# Patient Record
Sex: Female | Born: 1940
Health system: Southern US, Community
[De-identification: ages and names within clinical notes are randomized; demographics above are authoritative.]

## PROBLEM LIST (undated history)

## (undated) DIAGNOSIS — D649 Anemia, unspecified: Secondary | ICD-10-CM

## (undated) DIAGNOSIS — G8929 Other chronic pain: Secondary | ICD-10-CM

## (undated) DIAGNOSIS — M7130 Other bursal cyst, unspecified site: Secondary | ICD-10-CM

## (undated) DIAGNOSIS — J45909 Unspecified asthma, uncomplicated: Secondary | ICD-10-CM

## (undated) DIAGNOSIS — M4800 Spinal stenosis, site unspecified: Secondary | ICD-10-CM

## (undated) DIAGNOSIS — Z90710 Acquired absence of both cervix and uterus: Secondary | ICD-10-CM

## (undated) DIAGNOSIS — Z981 Arthrodesis status: Secondary | ICD-10-CM

## (undated) DIAGNOSIS — K5790 Diverticulosis of intestine, part unspecified, without perforation or abscess without bleeding: Secondary | ICD-10-CM

## (undated) DIAGNOSIS — I10 Essential (primary) hypertension: Secondary | ICD-10-CM

## (undated) DIAGNOSIS — M549 Dorsalgia, unspecified: Secondary | ICD-10-CM

## (undated) HISTORY — DX: Arthrodesis status: Z98.1

## (undated) HISTORY — PX: BACK SURGERY: SHX140

## (undated) HISTORY — PX: CYST REMOVAL TRUNK: SHX6283

## (undated) HISTORY — PX: ABDOMINAL HYSTERECTOMY: SHX81

---

## 1998-07-10 ENCOUNTER — Ambulatory Visit (HOSPITAL_COMMUNITY): Admission: RE | Admit: 1998-07-10 | Discharge: 1998-07-10 | Payer: Self-pay | Admitting: Gastroenterology

## 1998-11-25 ENCOUNTER — Other Ambulatory Visit: Admission: RE | Admit: 1998-11-25 | Discharge: 1998-11-25 | Payer: Self-pay | Admitting: Family Medicine

## 1999-11-18 ENCOUNTER — Encounter: Payer: Self-pay | Admitting: Family Medicine

## 1999-11-18 ENCOUNTER — Encounter: Admission: RE | Admit: 1999-11-18 | Discharge: 1999-11-18 | Payer: Self-pay | Admitting: Family Medicine

## 2000-12-09 ENCOUNTER — Encounter: Payer: Self-pay | Admitting: Family Medicine

## 2000-12-09 ENCOUNTER — Encounter: Admission: RE | Admit: 2000-12-09 | Discharge: 2000-12-09 | Payer: Self-pay | Admitting: Family Medicine

## 2000-12-31 ENCOUNTER — Encounter: Admission: RE | Admit: 2000-12-31 | Discharge: 2000-12-31 | Payer: Self-pay | Admitting: Family Medicine

## 2000-12-31 ENCOUNTER — Encounter: Payer: Self-pay | Admitting: Family Medicine

## 2001-12-12 ENCOUNTER — Encounter: Admission: RE | Admit: 2001-12-12 | Discharge: 2001-12-12 | Payer: Self-pay | Admitting: Family Medicine

## 2001-12-12 ENCOUNTER — Encounter: Payer: Self-pay | Admitting: Family Medicine

## 2002-01-03 ENCOUNTER — Other Ambulatory Visit: Admission: RE | Admit: 2002-01-03 | Discharge: 2002-01-03 | Payer: Self-pay | Admitting: Family Medicine

## 2002-12-14 ENCOUNTER — Encounter: Payer: Self-pay | Admitting: Family Medicine

## 2002-12-14 ENCOUNTER — Encounter: Admission: RE | Admit: 2002-12-14 | Discharge: 2002-12-14 | Payer: Self-pay | Admitting: Family Medicine

## 2003-03-30 ENCOUNTER — Encounter: Admission: RE | Admit: 2003-03-30 | Discharge: 2003-03-30 | Payer: Self-pay | Admitting: Family Medicine

## 2003-08-10 ENCOUNTER — Emergency Department (HOSPITAL_COMMUNITY): Admission: EM | Admit: 2003-08-10 | Discharge: 2003-08-11 | Payer: Self-pay | Admitting: Emergency Medicine

## 2003-08-17 ENCOUNTER — Encounter: Admission: RE | Admit: 2003-08-17 | Discharge: 2003-08-17 | Payer: Self-pay | Admitting: Family Medicine

## 2003-12-18 ENCOUNTER — Ambulatory Visit (HOSPITAL_COMMUNITY): Admission: RE | Admit: 2003-12-18 | Discharge: 2003-12-18 | Payer: Self-pay | Admitting: Family Medicine

## 2004-02-11 ENCOUNTER — Encounter: Admission: RE | Admit: 2004-02-11 | Discharge: 2004-02-11 | Payer: Self-pay | Admitting: Family Medicine

## 2004-02-11 ENCOUNTER — Other Ambulatory Visit: Admission: RE | Admit: 2004-02-11 | Discharge: 2004-02-11 | Payer: Self-pay | Admitting: Family Medicine

## 2004-02-18 ENCOUNTER — Encounter: Admission: RE | Admit: 2004-02-18 | Discharge: 2004-02-18 | Payer: Self-pay | Admitting: Family Medicine

## 2004-12-18 ENCOUNTER — Ambulatory Visit (HOSPITAL_COMMUNITY): Admission: RE | Admit: 2004-12-18 | Discharge: 2004-12-18 | Payer: Self-pay | Admitting: Family Medicine

## 2005-02-11 ENCOUNTER — Other Ambulatory Visit: Admission: RE | Admit: 2005-02-11 | Discharge: 2005-02-11 | Payer: Self-pay | Admitting: Family Medicine

## 2005-05-21 ENCOUNTER — Encounter: Admission: RE | Admit: 2005-05-21 | Discharge: 2005-05-21 | Payer: Self-pay | Admitting: Family Medicine

## 2005-07-13 ENCOUNTER — Encounter: Admission: RE | Admit: 2005-07-13 | Discharge: 2005-07-13 | Payer: Self-pay | Admitting: Family Medicine

## 2005-09-02 ENCOUNTER — Ambulatory Visit (HOSPITAL_COMMUNITY): Admission: RE | Admit: 2005-09-02 | Discharge: 2005-09-03 | Payer: Self-pay | Admitting: Neurosurgery

## 2005-12-23 ENCOUNTER — Encounter: Admission: RE | Admit: 2005-12-23 | Discharge: 2005-12-23 | Payer: Self-pay | Admitting: Family Medicine

## 2006-01-20 ENCOUNTER — Encounter: Admission: RE | Admit: 2006-01-20 | Discharge: 2006-01-20 | Payer: Self-pay | Admitting: Family Medicine

## 2006-10-18 ENCOUNTER — Encounter: Admission: RE | Admit: 2006-10-18 | Discharge: 2006-10-18 | Payer: Self-pay | Admitting: Family Medicine

## 2007-01-24 ENCOUNTER — Encounter: Admission: RE | Admit: 2007-01-24 | Discharge: 2007-01-24 | Payer: Self-pay | Admitting: Family Medicine

## 2007-03-23 ENCOUNTER — Encounter: Payer: Self-pay | Admitting: Internal Medicine

## 2007-03-30 ENCOUNTER — Encounter: Payer: Self-pay | Admitting: Internal Medicine

## 2007-04-21 ENCOUNTER — Ambulatory Visit: Payer: Self-pay | Admitting: Internal Medicine

## 2007-04-21 DIAGNOSIS — R0989 Other specified symptoms and signs involving the circulatory and respiratory systems: Secondary | ICD-10-CM | POA: Insufficient documentation

## 2007-04-21 DIAGNOSIS — R0609 Other forms of dyspnea: Secondary | ICD-10-CM

## 2007-04-21 DIAGNOSIS — I10 Essential (primary) hypertension: Secondary | ICD-10-CM | POA: Insufficient documentation

## 2007-05-13 ENCOUNTER — Encounter: Payer: Self-pay | Admitting: Internal Medicine

## 2007-06-02 ENCOUNTER — Ambulatory Visit: Payer: Self-pay | Admitting: Internal Medicine

## 2007-06-02 DIAGNOSIS — K219 Gastro-esophageal reflux disease without esophagitis: Secondary | ICD-10-CM | POA: Insufficient documentation

## 2007-06-24 ENCOUNTER — Telehealth (INDEPENDENT_AMBULATORY_CARE_PROVIDER_SITE_OTHER): Payer: Self-pay | Admitting: *Deleted

## 2007-07-08 ENCOUNTER — Ambulatory Visit: Payer: Self-pay | Admitting: Internal Medicine

## 2007-07-08 DIAGNOSIS — R059 Cough, unspecified: Secondary | ICD-10-CM | POA: Insufficient documentation

## 2007-07-08 DIAGNOSIS — R05 Cough: Secondary | ICD-10-CM

## 2007-08-19 ENCOUNTER — Ambulatory Visit: Payer: Self-pay | Admitting: Internal Medicine

## 2007-09-19 ENCOUNTER — Ambulatory Visit: Payer: Self-pay | Admitting: Internal Medicine

## 2008-01-25 ENCOUNTER — Encounter: Admission: RE | Admit: 2008-01-25 | Discharge: 2008-01-25 | Payer: Self-pay | Admitting: Family Medicine

## 2008-02-18 ENCOUNTER — Encounter: Admission: RE | Admit: 2008-02-18 | Discharge: 2008-02-18 | Payer: Self-pay | Admitting: Neurosurgery

## 2008-03-15 ENCOUNTER — Encounter: Admission: RE | Admit: 2008-03-15 | Discharge: 2008-03-15 | Payer: Self-pay | Admitting: Neurosurgery

## 2008-03-28 ENCOUNTER — Encounter: Admission: RE | Admit: 2008-03-28 | Discharge: 2008-03-28 | Payer: Self-pay | Admitting: Family Medicine

## 2008-04-12 ENCOUNTER — Encounter: Admission: RE | Admit: 2008-04-12 | Discharge: 2008-04-12 | Payer: Self-pay | Admitting: Neurosurgery

## 2008-08-30 ENCOUNTER — Encounter: Admission: RE | Admit: 2008-08-30 | Discharge: 2008-08-30 | Payer: Self-pay | Admitting: Neurosurgery

## 2009-02-04 ENCOUNTER — Encounter: Admission: RE | Admit: 2009-02-04 | Discharge: 2009-02-04 | Payer: Self-pay | Admitting: Family Medicine

## 2009-02-26 ENCOUNTER — Encounter: Admission: RE | Admit: 2009-02-26 | Discharge: 2009-02-26 | Payer: Self-pay | Admitting: Neurosurgery

## 2009-03-11 ENCOUNTER — Encounter: Admission: RE | Admit: 2009-03-11 | Discharge: 2009-03-11 | Payer: Self-pay | Admitting: Family Medicine

## 2009-05-29 ENCOUNTER — Encounter: Admission: RE | Admit: 2009-05-29 | Discharge: 2009-05-29 | Payer: Self-pay | Admitting: Neurosurgery

## 2009-10-01 ENCOUNTER — Encounter: Admission: RE | Admit: 2009-10-01 | Discharge: 2009-10-01 | Payer: Self-pay | Admitting: Neurosurgery

## 2009-10-10 ENCOUNTER — Encounter: Admission: RE | Admit: 2009-10-10 | Discharge: 2009-10-10 | Payer: Self-pay | Admitting: Neurosurgery

## 2010-02-07 ENCOUNTER — Encounter: Admission: RE | Admit: 2010-02-07 | Discharge: 2010-02-07 | Payer: Self-pay | Admitting: Family Medicine

## 2010-03-11 ENCOUNTER — Encounter
Admission: RE | Admit: 2010-03-11 | Discharge: 2010-03-11 | Payer: Self-pay | Source: Home / Self Care | Attending: Neurosurgery | Admitting: Neurosurgery

## 2010-03-30 ENCOUNTER — Encounter: Payer: Self-pay | Admitting: Family Medicine

## 2010-04-08 ENCOUNTER — Encounter
Admission: RE | Admit: 2010-04-08 | Discharge: 2010-04-08 | Payer: Self-pay | Source: Home / Self Care | Attending: Internal Medicine | Admitting: Internal Medicine

## 2010-07-24 ENCOUNTER — Other Ambulatory Visit: Payer: Self-pay | Admitting: Neurosurgery

## 2010-07-24 DIAGNOSIS — M419 Scoliosis, unspecified: Secondary | ICD-10-CM

## 2010-07-25 NOTE — H&P (Signed)
NAME:  Tonya [name], Tonya [name]            ACCOUNT NO.:  [id number]   MEDICAL RECORD NO.:  [id number]          PATIENT TYPE:  AMB   LOCATION:  SDS                          FACILITY:  [facility]   PHYSICIAN:  Payton Doughty, M.D.      DATE OF BIRTH:  1940/03/22   DATE OF ADMISSION:  09/02/2005  DATE OF DISCHARGE:                                HISTORY & PHYSICAL   ADMITTING DIAGNOSIS:  Synovial cyst on the right side at L4-5.   SERVICE:  Neurosurgery.   HISTORY OF PRESENT ILLNESS:  This is a very nice 70 year old right-handed  white lady who has had pain in her back for a long time and is going down  her right leg for a few months.  She has pain down the dorsum of the right  foot, does not notice tripping over it.  The left side is not affected, nor  is her bladder.   PAST MEDICAL HISTORY:  Medical history is remarkable for exercise-induced  asthma.   MEDICATIONS:  1.  She takes Actonel 35 mg a week.  2.  Lisinopril & hydrochlorothiazide 10/12.5 mg once a day.  3.  Detrol LA 4 mg a day.  4.  Mobic 15 mg a day.  5.  Synthroid 0.075 mg once a day.  6.  Singulair 10 mg in the morning.  7.  Aciphex 20 mg a day.  8.  Alprazolam 0.25 mg on a limited p.r.n. basis.  9.  Qvar 80 mg in the morning and at night.  10. Klonopin 0.5 mg at night.  11. Fluoxetine 10 mg once a day.  12. Skelaxin 800 mg on a p.r.n. basis.  13. Extra-Strength Tylenol.  14. She is to use a vitamin preparation as well.   ALLERGIES:  COMPAZINE, which caused her to have an oculogyric crisis, and  SULFA, which gives her nausea.   SURGICAL HISTORY:  1.  Tonsillectomy in 1963.  2.  Partial hysterectomy in 1972 or 1973.  3.  Ear operation in 1980.   SOCIAL HISTORY:  She does not smoke, is a very light social drinker and is  retired.   FAMILY HISTORY:  Both parents are deceased.  Mother had macular  degeneration, diabetes and hypertension.  Daddy had lung cancer and was a  vasculopath.   REVIEW OF SYSTEMS:  Review of  systems remarkable for COPD, back pain, leg  pain and thyroid disease.   PHYSICAL EXAMINATION:  HEENT:  Exam is within normal limits.  NECK:  She has reasonable range of motion of her neck.  CHEST:  Clear with no wheezes at this time.  CARDIAC:  Regular rate and rhythm with a midsystolic click.  ABDOMEN:  Nontender with no hepatosplenomegaly.  EXTREMITIES:  Extremities are without clubbing or cyanosis.  GU:  Exam is deferred.  PERIPHERAL VASCULAR:  Peripheral pulses are good.  NEUROLOGIC:  She is awake, alert and oriented.  Her cranial nerves appear to  function normally.  Motor exam shows 5/5 strength throughout the upper and  lower extremities, save for the dorsiflexors of the right foot that are 5-  /5.  Sensory dysesthesia is described in her right L5 distribution.  Deep  tendon reflexes are 2 at the knees, 2 at the left ankle, 1 at the right.  Straight leg raise is mildly positive on the right.   She comes accompanied with an MR that demonstrates scoliotic change concave  to the right in the L4-5 and L5-S1 region.  She has spinal stenosis at 3-4.  At 4-5, she has a synovial cyst on the right side which impedes the right L5  and to a certain extent the S1 root.   CLINICAL IMPRESSION:  Right L5 radiculopathy secondary to synovial cyst.   PLAN:  Plan is for removal of cyst.  I do not think I would be aggressive  with decompression at 3-4 because she has no claudicatory complaints.  The  risks and benefits of this approach have been discussed with her and she  wishes to proceed.           ______________________________  Payton Doughty, M.D.     MWR/MEDQ  D:  09/02/2005  T:  09/02/2005  Job:  [id number]

## 2010-07-25 NOTE — Op Note (Signed)
NAME:  Tonya [name], Tonya [name]            ACCOUNT NO.:  [id number]   MEDICAL RECORD NO.:  [id number]          PATIENT TYPE:  AMB   LOCATION:  SDS                          FACILITY:  [facility]   PHYSICIAN:  Payton Doughty, M.D.      DATE OF BIRTH:  Jan 10, 1941   DATE OF PROCEDURE:  09/02/2005  DATE OF DISCHARGE:                                 OPERATIVE REPORT   PREOPERATIVE DIAGNOSIS:  Right L5-S1 radiculopathy related to synovial cyst.   POSTOPERATIVE DIAGNOSIS:  Right L5-S1 radiculopathy related to synovial  cyst.   OPERATION PERFORMED:  Right L5 laminectomy, median facetectomy, excision of  calcified cysts.   SURGEON:  Payton Doughty, M.D.   ASSISTANT:  Hewitt Shorts, M.D.   ANESTHESIA:  General endotracheal.   PREP:  Sterile Betadine prep and scrub with alcohol wipe.   COMPLICATIONS:  None.   INDICATIONS FOR PROCEDURE:  This is a 70 year old right-handed white lady  with right L5 and S1 radiculopathy and severe spondylosis and probable  synovial cyst on the right side at L5 and S1.   DESCRIPTION OF PROCEDURE:  The patient was taken to the operating room,  smoothly anesthetized, intubated, and placed prone on the operating table.  Following shave, prep and drape in the usual sterile fashion, skin was  infiltrated with 1% lidocaine with 1:400,000 epinephrine.  Skin was incised  from mid-L3 to mid-S1.  The length of the incision is because of the  patient's extremely small body habitus and her scoliosis.  The lamina of L3  was first dissected.  Then the marker placed and operative x-ray was  obtaining.  Seeing that this was not the correct level, further exploration  down the incision was carried out and the sacrum and the L5 lamina were  carefully dissected free in the subperiosteal plane.  Second x-ray confirmed  the correctness of the level.  Using a high speed drill laminotomy was  carried out.  At the bottom of the laminotomy was the superior articular  process of S1 that was  removed using the Kerrison punch.  The high speed  drill was then used to expand the laminotomy inferiorly and superiorly to  the top and bottom of the ligamentum flavum respectively.  This was  mobilized and removed.  Attached at its inferior aspect was a calcified  cystic structure that had been extruded from the facet joint.  This was  removed without difficulty resulting in immediate decompression of the  lateral aspect of the right S1 nerve root as well as the thecal sac and  lateral relaxation of the medially displaced lateral aspect of the thecal  sac, thus  unencumbering the L5 root.  The entire area was irrigated, hemostasis was  assured.  Depo-Medrol soaked fat placed in the laminotomy defect.  Successive layers of 0 Vicryl, 2-0 Vicryl and 3-0 nylon were used to close  the incision.  Betadine Telfa dressing was applied and the patient returned  to recovery room in good condition.           ______________________________  Payton Doughty, M.D.     MWR/MEDQ  D:  09/02/2005  T:  09/02/2005  Job:  [id number]

## 2010-07-29 ENCOUNTER — Ambulatory Visit
Admission: RE | Admit: 2010-07-29 | Discharge: 2010-07-29 | Disposition: A | Discharge status: Home / Self Care | Payer: Medicare Other | Source: Ambulatory Visit | Attending: Neurosurgery | Admitting: Neurosurgery

## 2010-07-29 DIAGNOSIS — M419 Scoliosis, unspecified: Secondary | ICD-10-CM

## 2010-10-09 ENCOUNTER — Other Ambulatory Visit: Payer: Self-pay | Admitting: Neurosurgery

## 2010-10-09 DIAGNOSIS — M79604 Pain in right leg: Secondary | ICD-10-CM

## 2010-10-09 DIAGNOSIS — M549 Dorsalgia, unspecified: Secondary | ICD-10-CM

## 2010-10-10 ENCOUNTER — Other Ambulatory Visit: Payer: Self-pay | Admitting: Neurosurgery

## 2010-10-10 ENCOUNTER — Ambulatory Visit
Admission: RE | Admit: 2010-10-10 | Discharge: 2010-10-10 | Disposition: A | Discharge status: Home / Self Care | Payer: Medicare Other | Source: Ambulatory Visit | Attending: Neurosurgery | Admitting: Neurosurgery

## 2010-10-10 DIAGNOSIS — M79604 Pain in right leg: Secondary | ICD-10-CM

## 2010-10-10 DIAGNOSIS — M549 Dorsalgia, unspecified: Secondary | ICD-10-CM

## 2010-10-10 MED ORDER — METHYLPREDNISOLONE ACETATE 40 MG/ML INJ SUSP (RADIOLOG
120.0000 mg | Freq: Once | INTRAMUSCULAR | Status: AC
  Administered 2010-10-10: 120 mg via EPIDURAL

## 2010-10-10 MED ORDER — IOHEXOL 180 MG/ML  SOLN
1.0000 mL | Freq: Once | INTRAMUSCULAR | Status: AC | PRN
  Administered 2010-10-10: 1 mL via EPIDURAL

## 2010-10-22 ENCOUNTER — Other Ambulatory Visit: Payer: Self-pay | Admitting: Neurosurgery

## 2010-10-22 DIAGNOSIS — M545 Low back pain, unspecified: Secondary | ICD-10-CM

## 2010-10-22 DIAGNOSIS — M79604 Pain in right leg: Secondary | ICD-10-CM

## 2010-10-28 ENCOUNTER — Ambulatory Visit
Admission: RE | Admit: 2010-10-28 | Discharge: 2010-10-28 | Disposition: A | Discharge status: Home / Self Care | Payer: Medicare Other | Source: Ambulatory Visit | Attending: Neurosurgery | Admitting: Neurosurgery

## 2010-10-28 ENCOUNTER — Other Ambulatory Visit: Payer: Self-pay | Admitting: Neurosurgery

## 2010-10-28 DIAGNOSIS — M79604 Pain in right leg: Secondary | ICD-10-CM

## 2010-10-28 DIAGNOSIS — M545 Low back pain, unspecified: Secondary | ICD-10-CM

## 2010-10-28 DIAGNOSIS — M549 Dorsalgia, unspecified: Secondary | ICD-10-CM

## 2010-11-04 ENCOUNTER — Ambulatory Visit
Admission: RE | Admit: 2010-11-04 | Discharge: 2010-11-04 | Disposition: A | Discharge status: Home / Self Care | Payer: Medicare Other | Source: Ambulatory Visit | Attending: Neurosurgery | Admitting: Neurosurgery

## 2010-11-04 ENCOUNTER — Inpatient Hospital Stay: Admission: RE | Admit: 2010-11-04 | Payer: Medicare Other | Source: Ambulatory Visit

## 2010-11-04 DIAGNOSIS — M549 Dorsalgia, unspecified: Secondary | ICD-10-CM

## 2010-11-04 MED ORDER — METHYLPREDNISOLONE ACETATE 40 MG/ML INJ SUSP (RADIOLOG
120.0000 mg | Freq: Once | INTRAMUSCULAR | Status: AC
  Administered 2010-11-04: 120 mg via INTRA_ARTICULAR

## 2010-11-04 MED ORDER — IOHEXOL 180 MG/ML  SOLN
1.0000 mL | Freq: Once | INTRAMUSCULAR | Status: AC | PRN
  Administered 2010-11-04: 1 mL via INTRA_ARTICULAR

## 2011-02-12 ENCOUNTER — Other Ambulatory Visit: Payer: Self-pay | Admitting: Neurosurgery

## 2011-02-12 DIAGNOSIS — M549 Dorsalgia, unspecified: Secondary | ICD-10-CM

## 2011-02-12 DIAGNOSIS — M79606 Pain in leg, unspecified: Secondary | ICD-10-CM

## 2011-02-17 ENCOUNTER — Ambulatory Visit
Admission: RE | Admit: 2011-02-17 | Discharge: 2011-02-17 | Disposition: A | Discharge status: Home / Self Care | Payer: Medicare Other | Source: Ambulatory Visit | Attending: Neurosurgery | Admitting: Neurosurgery

## 2011-02-17 DIAGNOSIS — M79606 Pain in leg, unspecified: Secondary | ICD-10-CM

## 2011-02-17 DIAGNOSIS — M549 Dorsalgia, unspecified: Secondary | ICD-10-CM

## 2011-02-17 MED ORDER — IOHEXOL 180 MG/ML  SOLN
1.0000 mL | Freq: Once | INTRAMUSCULAR | Status: AC | PRN
  Administered 2011-02-17: 1 mL via INTRA_ARTICULAR

## 2011-02-17 MED ORDER — METHYLPREDNISOLONE ACETATE 40 MG/ML INJ SUSP (RADIOLOG
120.0000 mg | Freq: Once | INTRAMUSCULAR | Status: AC
  Administered 2011-02-17: 120 mg via INTRA_ARTICULAR

## 2011-03-09 ENCOUNTER — Other Ambulatory Visit: Payer: Self-pay | Admitting: Internal Medicine

## 2011-03-09 DIAGNOSIS — Z1231 Encounter for screening mammogram for malignant neoplasm of breast: Secondary | ICD-10-CM

## 2011-03-25 ENCOUNTER — Ambulatory Visit
Admission: RE | Admit: 2011-03-25 | Discharge: 2011-03-25 | Disposition: A | Discharge status: Home / Self Care | Payer: Medicare Other | Source: Ambulatory Visit | Attending: Internal Medicine | Admitting: Internal Medicine

## 2011-03-25 DIAGNOSIS — Z1231 Encounter for screening mammogram for malignant neoplasm of breast: Secondary | ICD-10-CM

## 2011-04-15 ENCOUNTER — Other Ambulatory Visit: Payer: Self-pay | Admitting: Neurosurgery

## 2011-04-15 DIAGNOSIS — M4310 Spondylolisthesis, site unspecified: Secondary | ICD-10-CM

## 2011-04-15 DIAGNOSIS — M419 Scoliosis, unspecified: Secondary | ICD-10-CM

## 2011-04-16 ENCOUNTER — Other Ambulatory Visit: Payer: Self-pay | Admitting: Neurosurgery

## 2011-04-16 ENCOUNTER — Ambulatory Visit
Admission: RE | Admit: 2011-04-16 | Discharge: 2011-04-16 | Disposition: A | Discharge status: Home / Self Care | Payer: Medicare Other | Source: Ambulatory Visit | Attending: Neurosurgery | Admitting: Neurosurgery

## 2011-04-16 VITALS — BP 150/68 | HR 83

## 2011-04-16 DIAGNOSIS — M419 Scoliosis, unspecified: Secondary | ICD-10-CM

## 2011-04-16 DIAGNOSIS — M4310 Spondylolisthesis, site unspecified: Secondary | ICD-10-CM

## 2011-06-10 ENCOUNTER — Other Ambulatory Visit: Payer: Self-pay | Admitting: Neurosurgery

## 2011-06-10 DIAGNOSIS — M419 Scoliosis, unspecified: Secondary | ICD-10-CM

## 2011-06-18 ENCOUNTER — Ambulatory Visit
Admission: RE | Admit: 2011-06-18 | Discharge: 2011-06-18 | Disposition: A | Discharge status: Home / Self Care | Payer: Medicare Other | Source: Ambulatory Visit | Attending: Neurosurgery | Admitting: Neurosurgery

## 2011-06-18 DIAGNOSIS — M419 Scoliosis, unspecified: Secondary | ICD-10-CM

## 2011-07-03 ENCOUNTER — Ambulatory Visit (HOSPITAL_COMMUNITY)
Admission: RE | Admit: 2011-07-03 | Discharge: 2011-07-03 | Disposition: A | Discharge status: Home / Self Care | Payer: Medicare Other | Source: Ambulatory Visit | Attending: Internal Medicine | Admitting: Internal Medicine

## 2011-07-03 DIAGNOSIS — M79609 Pain in unspecified limb: Secondary | ICD-10-CM

## 2011-07-03 DIAGNOSIS — M7989 Other specified soft tissue disorders: Secondary | ICD-10-CM

## 2011-07-03 NOTE — Progress Notes (Signed)
*  PRELIMINARY RESULTS* Vascular Ultrasound Right lower extremity venous duplex has been completed.  Preliminary findings: Right= No evidence of DVT or baker's cyst.  Farrel Demark, RDMS 07/03/2011, 11:09 AM

## 2011-12-11 ENCOUNTER — Other Ambulatory Visit: Payer: Self-pay | Admitting: Neurosurgery

## 2011-12-11 DIAGNOSIS — M419 Scoliosis, unspecified: Secondary | ICD-10-CM

## 2011-12-17 ENCOUNTER — Other Ambulatory Visit: Payer: Medicare Other

## 2011-12-17 ENCOUNTER — Ambulatory Visit
Admission: RE | Admit: 2011-12-17 | Discharge: 2011-12-17 | Disposition: A | Discharge status: Home / Self Care | Payer: Medicare Other | Source: Ambulatory Visit | Attending: Neurosurgery | Admitting: Neurosurgery

## 2011-12-17 DIAGNOSIS — M419 Scoliosis, unspecified: Secondary | ICD-10-CM

## 2011-12-17 MED ORDER — METHYLPREDNISOLONE ACETATE 40 MG/ML INJ SUSP (RADIOLOG
120.0000 mg | Freq: Once | INTRAMUSCULAR | Status: AC
  Administered 2011-12-17: 120 mg via EPIDURAL

## 2011-12-17 MED ORDER — IOHEXOL 180 MG/ML  SOLN
1.0000 mL | Freq: Once | INTRAMUSCULAR | Status: AC | PRN
  Administered 2011-12-17: 1 mL via EPIDURAL

## 2012-01-15 ENCOUNTER — Other Ambulatory Visit: Payer: Self-pay | Admitting: Internal Medicine

## 2012-01-15 DIAGNOSIS — N63 Unspecified lump in unspecified breast: Secondary | ICD-10-CM

## 2012-01-15 DIAGNOSIS — R590 Localized enlarged lymph nodes: Secondary | ICD-10-CM

## 2012-01-22 ENCOUNTER — Ambulatory Visit
Admission: RE | Admit: 2012-01-22 | Discharge: 2012-01-22 | Disposition: A | Discharge status: Home / Self Care | Payer: Medicare Other | Source: Ambulatory Visit | Attending: Internal Medicine | Admitting: Internal Medicine

## 2012-01-22 DIAGNOSIS — R590 Localized enlarged lymph nodes: Secondary | ICD-10-CM

## 2012-01-22 DIAGNOSIS — N63 Unspecified lump in unspecified breast: Secondary | ICD-10-CM

## 2012-01-26 ENCOUNTER — Other Ambulatory Visit: Payer: Self-pay | Admitting: Neurosurgery

## 2012-01-26 DIAGNOSIS — M47812 Spondylosis without myelopathy or radiculopathy, cervical region: Secondary | ICD-10-CM

## 2012-02-01 ENCOUNTER — Ambulatory Visit
Admission: RE | Admit: 2012-02-01 | Discharge: 2012-02-01 | Disposition: A | Discharge status: Home / Self Care | Payer: Medicare Other | Source: Ambulatory Visit | Attending: Neurosurgery | Admitting: Neurosurgery

## 2012-02-01 VITALS — BP 150/77 | HR 73

## 2012-02-01 DIAGNOSIS — M47812 Spondylosis without myelopathy or radiculopathy, cervical region: Secondary | ICD-10-CM

## 2012-02-01 MED ORDER — IOHEXOL 300 MG/ML  SOLN
1.0000 mL | Freq: Once | INTRAMUSCULAR | Status: AC | PRN
  Administered 2012-02-01: 1 mL

## 2012-02-02 ENCOUNTER — Other Ambulatory Visit: Payer: Self-pay | Admitting: Neurosurgery

## 2012-02-02 DIAGNOSIS — M5410 Radiculopathy, site unspecified: Secondary | ICD-10-CM

## 2012-02-02 DIAGNOSIS — M47812 Spondylosis without myelopathy or radiculopathy, cervical region: Secondary | ICD-10-CM

## 2012-02-03 ENCOUNTER — Inpatient Hospital Stay
Admission: RE | Admit: 2012-02-03 | Discharge: 2012-02-03 | Disposition: A | Discharge status: Home / Self Care | Payer: Self-pay | Source: Ambulatory Visit | Attending: Neurosurgery | Admitting: Neurosurgery

## 2012-02-03 ENCOUNTER — Ambulatory Visit
Admission: RE | Admit: 2012-02-03 | Discharge: 2012-02-03 | Disposition: A | Discharge status: Home / Self Care | Payer: Medicare Other | Source: Ambulatory Visit | Attending: Neurosurgery | Admitting: Neurosurgery

## 2012-02-03 ENCOUNTER — Other Ambulatory Visit: Payer: Self-pay | Admitting: Neurosurgery

## 2012-02-03 ENCOUNTER — Other Ambulatory Visit: Payer: Self-pay | Admitting: *Deleted

## 2012-02-03 VITALS — BP 123/66 | HR 74

## 2012-02-03 DIAGNOSIS — M5410 Radiculopathy, site unspecified: Secondary | ICD-10-CM

## 2012-02-03 DIAGNOSIS — M542 Cervicalgia: Secondary | ICD-10-CM

## 2012-02-03 DIAGNOSIS — M47812 Spondylosis without myelopathy or radiculopathy, cervical region: Secondary | ICD-10-CM

## 2012-02-03 MED ORDER — IOHEXOL 300 MG/ML  SOLN
1.0000 mL | Freq: Once | INTRAMUSCULAR | Status: AC | PRN
Start: 2012-02-03 — End: 2012-02-03
  Administered 2012-02-03: 1 mL via EPIDURAL

## 2012-02-03 MED ORDER — TRIAMCINOLONE ACETONIDE 40 MG/ML IJ SUSP (RADIOLOGY)
60.0000 mg | Freq: Once | INTRAMUSCULAR | Status: AC
  Administered 2012-02-03: 60 mg via EPIDURAL

## 2012-03-16 ENCOUNTER — Other Ambulatory Visit: Payer: Self-pay | Admitting: Neurosurgery

## 2012-03-16 DIAGNOSIS — M4306 Spondylolysis, lumbar region: Secondary | ICD-10-CM

## 2012-03-22 ENCOUNTER — Ambulatory Visit
Admission: RE | Admit: 2012-03-22 | Discharge: 2012-03-22 | Disposition: A | Discharge status: Home / Self Care | Payer: Medicare Other | Source: Ambulatory Visit | Attending: Neurosurgery | Admitting: Neurosurgery

## 2012-03-22 DIAGNOSIS — M4306 Spondylolysis, lumbar region: Secondary | ICD-10-CM

## 2012-03-22 MED ORDER — METHYLPREDNISOLONE ACETATE 40 MG/ML INJ SUSP (RADIOLOG
120.0000 mg | Freq: Once | INTRAMUSCULAR | Status: AC
  Administered 2012-03-22: 120 mg via EPIDURAL

## 2012-03-22 MED ORDER — IOHEXOL 180 MG/ML  SOLN
1.0000 mL | Freq: Once | INTRAMUSCULAR | Status: AC | PRN
  Administered 2012-03-22: 1 mL via EPIDURAL

## 2012-05-20 ENCOUNTER — Other Ambulatory Visit: Payer: Self-pay | Admitting: Neurosurgery

## 2012-05-20 DIAGNOSIS — M79601 Pain in right arm: Secondary | ICD-10-CM

## 2012-05-20 DIAGNOSIS — M4712 Other spondylosis with myelopathy, cervical region: Secondary | ICD-10-CM

## 2012-05-20 DIAGNOSIS — M79641 Pain in right hand: Secondary | ICD-10-CM

## 2012-05-25 ENCOUNTER — Ambulatory Visit
Admission: RE | Admit: 2012-05-25 | Discharge: 2012-05-25 | Disposition: A | Discharge status: Home / Self Care | Payer: Medicare Other | Source: Ambulatory Visit | Attending: Neurosurgery | Admitting: Neurosurgery

## 2012-05-25 DIAGNOSIS — M79601 Pain in right arm: Secondary | ICD-10-CM

## 2012-05-25 DIAGNOSIS — M4712 Other spondylosis with myelopathy, cervical region: Secondary | ICD-10-CM

## 2012-05-25 DIAGNOSIS — M79641 Pain in right hand: Secondary | ICD-10-CM

## 2012-06-13 ENCOUNTER — Other Ambulatory Visit: Payer: Self-pay | Admitting: Neurosurgery

## 2012-06-13 DIAGNOSIS — M47816 Spondylosis without myelopathy or radiculopathy, lumbar region: Secondary | ICD-10-CM

## 2012-07-04 DIAGNOSIS — M5137 Other intervertebral disc degeneration, lumbosacral region: Secondary | ICD-10-CM | POA: Insufficient documentation

## 2012-07-05 DIAGNOSIS — M419 Scoliosis, unspecified: Secondary | ICD-10-CM | POA: Insufficient documentation

## 2012-08-19 ENCOUNTER — Ambulatory Visit
Admission: RE | Admit: 2012-08-19 | Discharge: 2012-08-19 | Disposition: A | Discharge status: Home / Self Care | Payer: Medicare Other | Source: Ambulatory Visit | Attending: Neurosurgery | Admitting: Neurosurgery

## 2012-08-19 DIAGNOSIS — M47816 Spondylosis without myelopathy or radiculopathy, lumbar region: Secondary | ICD-10-CM

## 2012-08-19 MED ORDER — IOHEXOL 180 MG/ML  SOLN
1.0000 mL | Freq: Once | INTRAMUSCULAR | Status: AC | PRN
  Administered 2012-08-19: 1 mL via EPIDURAL

## 2012-08-19 MED ORDER — METHYLPREDNISOLONE ACETATE 40 MG/ML INJ SUSP (RADIOLOG
120.0000 mg | Freq: Once | INTRAMUSCULAR | Status: AC
  Administered 2012-08-19: 120 mg via EPIDURAL

## 2012-08-31 DIAGNOSIS — S129XXA Fracture of neck, unspecified, initial encounter: Secondary | ICD-10-CM | POA: Insufficient documentation

## 2012-10-24 DIAGNOSIS — M4802 Spinal stenosis, cervical region: Secondary | ICD-10-CM | POA: Insufficient documentation

## 2012-11-02 ENCOUNTER — Non-Acute Institutional Stay (SKILLED_NURSING_FACILITY): Payer: Medicare Other | Admitting: Internal Medicine

## 2012-11-02 DIAGNOSIS — I10 Essential (primary) hypertension: Secondary | ICD-10-CM

## 2012-11-02 DIAGNOSIS — E039 Hypothyroidism, unspecified: Secondary | ICD-10-CM

## 2012-11-02 DIAGNOSIS — K5900 Constipation, unspecified: Secondary | ICD-10-CM

## 2012-11-02 DIAGNOSIS — M5030 Other cervical disc degeneration, unspecified cervical region: Secondary | ICD-10-CM

## 2012-11-15 ENCOUNTER — Non-Acute Institutional Stay (SKILLED_NURSING_FACILITY): Payer: Medicare Other | Admitting: Adult Health

## 2012-11-15 DIAGNOSIS — N3281 Overactive bladder: Secondary | ICD-10-CM | POA: Insufficient documentation

## 2012-11-15 DIAGNOSIS — J45909 Unspecified asthma, uncomplicated: Secondary | ICD-10-CM

## 2012-11-15 DIAGNOSIS — I10 Essential (primary) hypertension: Secondary | ICD-10-CM

## 2012-11-15 DIAGNOSIS — F419 Anxiety disorder, unspecified: Secondary | ICD-10-CM | POA: Insufficient documentation

## 2012-11-15 DIAGNOSIS — M5030 Other cervical disc degeneration, unspecified cervical region: Secondary | ICD-10-CM

## 2012-11-15 DIAGNOSIS — N318 Other neuromuscular dysfunction of bladder: Secondary | ICD-10-CM

## 2012-11-15 DIAGNOSIS — F32A Depression, unspecified: Secondary | ICD-10-CM | POA: Insufficient documentation

## 2012-11-15 DIAGNOSIS — K5900 Constipation, unspecified: Secondary | ICD-10-CM

## 2012-11-15 DIAGNOSIS — F411 Generalized anxiety disorder: Secondary | ICD-10-CM

## 2012-11-15 DIAGNOSIS — F329 Major depressive disorder, single episode, unspecified: Secondary | ICD-10-CM

## 2012-11-15 DIAGNOSIS — E039 Hypothyroidism, unspecified: Secondary | ICD-10-CM | POA: Insufficient documentation

## 2012-11-15 NOTE — Progress Notes (Signed)
Patient ID: Tonya [name], female   DOB: [birth year], 72 y.o.   MRN: [id number]        PROGRESS NOTE  DATE: 11/15/2012   FACILITY: [facility]  LEVEL OF CARE: SNF (31)   CHIEF COMPLAINT: Discharge Visit   HISTORY OF PRESENT ILLNESS: This is a 72 year old female who is for discharge home with home health PT, OT and nursing. DME: Rolling walker  (youth size) due to unsteady gait. She has been admitted to [facility] on 10/28/12 from [facility] with cervical stenosis status post decompressive laminectomy and fusion. Patient was admitted to this facility for short-term rehabilitation. Patient has completed SNF rehabilitation and therapy has cleared the patient for discharge.  Reassessment of ongoing problem(s):  HTN: Pt 's HTN remains stable.  Denies CP, sob, DOE, pedal edema, headaches, dizziness or visual disturbances.  No complications from the medications currently being used.  Last BP : 110/62  DEPRESSION: The depression remains stable. Patient denies ongoing feelings of sadness, insomnia, anedhonia or lack of appetite. No complications reported from the medications currently being used. Staff do not report behavioral problems.  HYPOTHYROIDISM:  No complications noted from the medications presently being used.  The patient denies fatigue or constipation. 9/14 TSH 5.6860 (elevated)  PAST MEDICAL HISTORY : Reviewed.  No changes.  CURRENT MEDICATIONS: Reviewed per [facility]  REVIEW OF SYSTEMS:  GENERAL: no change in appetite, no fatigue, no weight changes, no fever, chills or weakness RESPIRATORY: no cough, SOB, DOE, wheezing, hemoptysis CARDIAC: no chest pain, edema or palpitations GI: no abdominal pain, diarrhea, constipation, heart burn, nausea or vomiting  PHYSICAL EXAMINATION  VS:  T 97.6       P 66       RR 18      BP 110/62      POX %       WT 110 (Lb)  GENERAL: no acute distress, normal body habitus EYES: conjunctivae normal,  sclerae normal, normal eye lids NECK: supple, trachea midline, no neck masses, no thyroid tenderness, no thyromegaly LYMPHATICS: no LAN in the neck, no supraclavicular LAN RESPIRATORY: breathing is even & unlabored, BS CTAB CARDIAC: RRR, no murmur,no extra heart sounds, no edema GI: abdomen soft, normal BS, no masses, no tenderness, no hepatomegaly, no splenomegaly PSYCHIATRIC: the patient is alert & oriented to person, affect & behavior appropriate  LABS/RADIOLOGY: 11/14/12  tsh 5.6860 11/03/12 WBC 8.8 hemoglobin 10.4 hematocrit 32.0 sodium 135 potassium 4.1 BUN 11 creatinine 0.6 calcium 9.1  ASSESSMENT/PLAN:  Cervical stenosis status post decompressive laminectomy and fusion - for home health PT, OT and nursing  Hypertension - well controlled  Asthma - stable  Depression - stable; continue Prozac  Constipation -  No complaints  Overactive bladder - stable  Anxiety - stable  Hypothyroidism - increase Synthroid to 88 mcg 1 tab PO Q D; tsh in 6 weeks   I have filled out patient's discharge paperwork and written prescriptions.  Patient will receive home health PT, OT and Nursing. DME provided: Rolling walker  Total discharge time: Greater than 30 minutes Discharge time involved coordination of the discharge process with Child psychotherapist, nursing staff and therapy department. Medical justification for home health services/DME verified.  CPT CODE: 40981

## 2012-11-22 DIAGNOSIS — Z981 Arthrodesis status: Secondary | ICD-10-CM

## 2012-11-22 DIAGNOSIS — M542 Cervicalgia: Secondary | ICD-10-CM | POA: Insufficient documentation

## 2012-11-22 HISTORY — DX: Arthrodesis status: Z98.1

## 2012-12-02 ENCOUNTER — Emergency Department (HOSPITAL_COMMUNITY)
Admission: EM | Admit: 2012-12-02 | Discharge: 2012-12-02 | Disposition: A | Discharge status: Home / Self Care | Payer: Medicare Other | Attending: Emergency Medicine | Admitting: Emergency Medicine

## 2012-12-02 ENCOUNTER — Emergency Department (HOSPITAL_COMMUNITY): Payer: Medicare Other

## 2012-12-02 ENCOUNTER — Encounter (HOSPITAL_COMMUNITY): Payer: Self-pay | Admitting: Emergency Medicine

## 2012-12-02 DIAGNOSIS — Z79899 Other long term (current) drug therapy: Secondary | ICD-10-CM | POA: Insufficient documentation

## 2012-12-02 DIAGNOSIS — R0602 Shortness of breath: Secondary | ICD-10-CM | POA: Insufficient documentation

## 2012-12-02 DIAGNOSIS — I10 Essential (primary) hypertension: Secondary | ICD-10-CM | POA: Insufficient documentation

## 2012-12-02 DIAGNOSIS — Z8739 Personal history of other diseases of the musculoskeletal system and connective tissue: Secondary | ICD-10-CM | POA: Insufficient documentation

## 2012-12-02 DIAGNOSIS — J45909 Unspecified asthma, uncomplicated: Secondary | ICD-10-CM | POA: Insufficient documentation

## 2012-12-02 DIAGNOSIS — Z862 Personal history of diseases of the blood and blood-forming organs and certain disorders involving the immune mechanism: Secondary | ICD-10-CM | POA: Insufficient documentation

## 2012-12-02 DIAGNOSIS — R5381 Other malaise: Secondary | ICD-10-CM | POA: Insufficient documentation

## 2012-12-02 DIAGNOSIS — R110 Nausea: Secondary | ICD-10-CM | POA: Insufficient documentation

## 2012-12-02 HISTORY — DX: Other bursal cyst, unspecified site: M71.30

## 2012-12-02 HISTORY — DX: Spinal stenosis, site unspecified: M48.00

## 2012-12-02 HISTORY — DX: Anemia, unspecified: D64.9

## 2012-12-02 HISTORY — DX: Other chronic pain: G89.29

## 2012-12-02 HISTORY — DX: Unspecified asthma, uncomplicated: J45.909

## 2012-12-02 HISTORY — DX: Dorsalgia, unspecified: M54.9

## 2012-12-02 HISTORY — DX: Acquired absence of both cervix and uterus: Z90.710

## 2012-12-02 LAB — URINALYSIS, ROUTINE W REFLEX MICROSCOPIC
Bilirubin Urine: NEGATIVE
Glucose, UA: NEGATIVE mg/dL
Ketones, ur: NEGATIVE mg/dL
Leukocytes, UA: NEGATIVE
Protein, ur: NEGATIVE mg/dL
Urobilinogen, UA: 0.2 mg/dL (ref 0.0–1.0)
pH: 8 (ref 5.0–8.0)

## 2012-12-02 LAB — CBC WITH DIFFERENTIAL/PLATELET
Basophils Relative: 1 % (ref 0–1)
Eosinophils Relative: 1 % (ref 0–5)
HCT: 32.9 % — ABNORMAL LOW (ref 36.0–46.0)
Hemoglobin: 11.1 g/dL — ABNORMAL LOW (ref 12.0–15.0)
Lymphocytes Relative: 28 % (ref 12–46)
MCHC: 33.7 g/dL (ref 30.0–36.0)
MCV: 89.9 fL (ref 78.0–100.0)
Monocytes Absolute: 1 10*3/uL (ref 0.1–1.0)
Monocytes Relative: 11 % (ref 3–12)
Neutro Abs: 5.4 10*3/uL (ref 1.7–7.7)
WBC: 9 10*3/uL (ref 4.0–10.5)

## 2012-12-02 LAB — BASIC METABOLIC PANEL
BUN: 10 mg/dL (ref 6–23)
CO2: 23 mEq/L (ref 19–32)
Calcium: 9.6 mg/dL (ref 8.4–10.5)
Chloride: 98 mEq/L (ref 96–112)
Creatinine, Ser: 0.58 mg/dL (ref 0.50–1.10)
GFR calc Af Amer: >90 mL/min (ref >90)
Glucose, Bld: 104 mg/dL — ABNORMAL HIGH (ref 70–99)

## 2012-12-02 NOTE — ED Notes (Signed)
Pt states that she had major back surgery 8/18 then went to rehab for 3 weeks. Pt states that she was having PT today and was feeling bad, so they took her BP.  It has slowly been increasing.  Pt states she feels weak and feels SOB.

## 2012-12-02 NOTE — ED Notes (Signed)
EKG given to EDP,Allen,MD. 

## 2012-12-02 NOTE — ED Notes (Signed)
Patient ambulated to room

## 2012-12-02 NOTE — ED Provider Notes (Signed)
  This was a shared visit with a mid-level provided (NP or PA).  Throughout the patient's course I was available for consultation/collaboration.  I saw the ECG (if appropriate), relevant labs and studies - I agree with the interpretation.  On my exam the patient was in no distress.      Gerhard Munch, MD 12/02/12 [phone]

## 2012-12-02 NOTE — ED Notes (Signed)
Patient transported to XR. 

## 2012-12-02 NOTE — ED Provider Notes (Signed)
CSN: [id number]     Arrival date & time 12/02/12  1911 History   First MD Initiated Contact with Patient 12/02/12 2025     Chief Complaint  Patient presents with  . Hypertension   (Consider location/radiation/quality/duration/timing/severity/associated sxs/prior Treatment) Patient is a 72 y.o. female presenting with hypertension. The history is provided by the patient. No language interpreter was used.  Hypertension This is a chronic problem. The current episode started in the past 7 days. Associated symptoms include fatigue and nausea. Pertinent negatives include no chest pain, headaches or visual change.  Patient recently had surgery on her neck 8/18 at [facility], has completed her rehab.  After release from rehab, patient has not been taking her routine medications on a regular basis.  During PT visit today, patient complained of weakness and mild shortness of breath.  Blood pressure was reportedly elevated when checked by PT.  Past Medical History  Diagnosis Date  . Back pain, chronic   . Spinal stenosis   . Asthma   . Anemia   . History of hysterectomy   . Synovial cyst    Past Surgical History  Procedure Laterality Date  . Back surgery    . Cyst removal trunk     No family history on file. History  Substance Use Topics  . Smoking status: Never Smoker   . Smokeless tobacco: Never Used  . Alcohol Use: Yes     Comment: socially   OB History   Grav Para Term Preterm Abortions TAB SAB Ect Mult Living                 Review of Systems  Constitutional: Positive for fatigue.  Respiratory: Positive for shortness of breath.   Cardiovascular: Negative for chest pain, palpitations and leg swelling.  Gastrointestinal: Positive for nausea.  Neurological: Negative for headaches.  All other systems reviewed and are negative.    Allergies  Prochlorperazine edisylate and Sulfa antibiotics  Home Medications   Current Outpatient Rx  Name  Route  Sig  Dispense  Refill  .  budesonide-formoterol (SYMBICORT) 80-4.5 MCG/ACT inhaler   Inhalation   Inhale 2 puffs into the lungs 2 (two) times daily.         . Calcium-Magnesium-Vitamin D 500-250-200 MG-MG-UNIT TABS   Oral   Take 5 tablets by mouth daily.         . cholecalciferol (VITAMIN D) 1000 UNITS tablet   Oral   Take 2,000 Units by mouth daily.         . clonazePAM (KLONOPIN) 1 MG tablet   Oral   Take 1 mg by mouth at bedtime as needed for anxiety.         . cycloSPORINE (RESTASIS) 0.05 % ophthalmic emulsion   Both Eyes   Place 1 drop into both eyes 2 (two) times daily.         . fish oil-omega-3 fatty acids 1000 MG capsule   Oral   Take 2 g by mouth daily.         Marland Kitchen FLUoxetine (PROZAC) 10 MG capsule   Oral   Take 10 mg by mouth daily.         Marland Kitchen gabapentin (NEURONTIN) 300 MG capsule   Oral   Take 300 mg by mouth 3 (three) times daily.         Marland Kitchen glucosamine-chondroitin 500-400 MG tablet   Oral   Take 1 tablet by mouth 3 (three) times daily.         Marland Kitchen  HYDROcodone-acetaminophen (NORCO) 10-325 MG per tablet   Oral   Take 1 tablet by mouth every 8 (eight) hours as needed for pain.         Marland Kitchen lansoprazole (PREVACID) 15 MG capsule   Oral   Take 15 mg by mouth daily.         Marland Kitchen levalbuterol (XOPENEX HFA) 45 MCG/ACT inhaler   Inhalation   Inhale 1-2 puffs into the lungs every 4 (four) hours as needed for wheezing.         Marland Kitchen levothyroxine (SYNTHROID, LEVOTHROID) 88 MCG tablet   Oral   Take 88 mcg by mouth daily before breakfast.         . losartan-hydrochlorothiazide (HYZAAR) 100-12.5 MG per tablet   Oral   Take 1 tablet by mouth daily.         . multivitamin-lutein (OCUVITE-LUTEIN) CAPS capsule   Oral   Take 1 capsule by mouth daily.         Marland Kitchen oxyCODONE (OXY IR/ROXICODONE) 5 MG immediate release tablet   Oral   Take 10 mg by mouth every 4 (four) hours as needed for pain.         Marland Kitchen telmisartan (MICARDIS) 80 MG tablet   Oral   Take 80 mg by mouth  daily.         Marland Kitchen tolterodine (DETROL LA) 4 MG 24 hr capsule   Oral   Take 4 mg by mouth daily.         . vitamin C (ASCORBIC ACID) 500 MG tablet   Oral   Take 500 mg by mouth daily.          BP 180/96  Pulse 96  Temp(Src) 98.3 F (36.8 C) (Oral)  Resp 20  Ht 4\' 9"  (1.448 m)  Wt 109 lb (49.442 kg)  BMI 23.58 kg/m2  SpO2 97% Physical Exam  Nursing note and vitals reviewed. Constitutional: She is oriented to person, place, and time. She appears well-developed and well-nourished.  HENT:  Head: Normocephalic.  Eyes: EOM are normal. Pupils are equal, round, and reactive to light.  Cardiovascular: Normal rate and regular rhythm.   Pulmonary/Chest: Effort normal and breath sounds normal.  Abdominal: Soft. Bowel sounds are normal.  Musculoskeletal: Normal range of motion. She exhibits no edema and no tenderness.  Neurological: She is alert and oriented to person, place, and time.  Skin: Skin is warm and dry.  Psychiatric: She has a normal mood and affect. Her behavior is normal. Judgment and thought content normal.    ED Course  Procedures (including critical care time)   Date: 12/02/2012  Rate: 90  Rhythm: normal sinus rhythm  QRS Axis: normal  Intervals: normal  ST/T Wave abnormalities: normal  Conduction Disutrbances:none  Narrative Interpretation: NSR without ischemia  Old EKG Reviewed: none available  Labs Review Labs Reviewed  URINALYSIS, ROUTINE W REFLEX MICROSCOPIC  CBC WITH DIFFERENTIAL  BASIC METABOLIC PANEL   Imaging Review No results found. Radiology and lab results reviewed, shared with patient and family.  Patient discussed with and seen by Dr. Jeraldine Loots. MDM  Hypertension, recent short-term non-compliance with medication.  No evidence of end-organ compromise.    Jimmye Norman, NP 12/02/12 2352

## 2012-12-02 NOTE — ED Notes (Signed)
Patient escorted to restroom by family member

## 2012-12-02 NOTE — ED Notes (Addendum)
Patient c/o HTN and feeling poorly. Patient A&Ox4, NAD. Family at bedside

## 2012-12-04 NOTE — Progress Notes (Signed)
Patient ID: Tonya [name], female   DOB: 17-Aug-1940, 72 y.o.   MRN: [id number]        HISTORY & PHYSICAL  DATE: 11/02/2012        FACILITY: [facility]  LEVEL OF CARE: SNF (31)  ALLERGIES:  Allergies  Allergen Reactions  . Prochlorperazine Edisylate Other (See Comments)    (aka Compazine) causes facial stiffness  . Sulfa Antibiotics     CHIEF COMPLAINT:  Manage cervical stenosis, hypothyroidism, and hypertension.    HISTORY OF PRESENT ILLNESS:   The patient is a 72 year-old, Caucasian female.    SPINAL STENOSIS: The patient underwent decompressive cervical laminectomy, C5 to C7, with subsequent lateral pedicle screw fixation from C5 through T3 bilaterally with a posterior fusion.  She tolerated the procedure well.   Patient's spinal stenosis remains stable. Patient denies ongoing low back pain, numbness, tingling or weakness. The patient is complaining of ongoing neck and shoulder pain.  No complications reported from the medications currently being used.     HYPOTHYROIDISM: The hypothyroidism remains stable. No complications noted from the medications presently being used.  The patient denies fatigue or constipation.  Last TSH:  Not available.     HTN: Pt 's HTN remains stable.  Denies CP, sob, DOE, pedal edema, headaches, dizziness or visual disturbances.  No complications from the medications currently being used.  Last BP :  132/76.    PAST MEDICAL HISTORY :  Past Medical History  Diagnosis Date  . Back pain, chronic   . Spinal stenosis   . Asthma   . Anemia   . History of hysterectomy   . Synovial cyst    Lumbosacral degenerative disc disease.    Cervical degenerative disc disease.    Hypertension.    GERD.   Hypothyroidism.    Anxiety.    Cataracts.    Mitral valve prolapse.    PAST SURGICAL HISTORY: Past Surgical History  Procedure Laterality Date  . Back surgery    . Cyst removal trunk     Tonsillectomy.    Partial  hysterectomy.     Cataract surgery.    Inner ear surgery.     Cervical fusion.    Synovial cyst removal.     SOCIAL HISTORY:  reports that she has never smoked. She has never used smokeless tobacco. She reports that  drinks alcohol. She reports that she does not use illicit drugs. MARITAL HISTORY:  The patient is married.   ALCOHOL:  The patient does drink alcohol, which is three drinks a week.    FAMILY HISTORY:  MOTHER:  Mother had diabetes mellitus.   FATHER:  Father had cancer and dementia.   SIBLINGS:  Brother has tremor and hypertension.     CURRENT MEDICATIONS: Reviewed per [facility]  REVIEW OF SYSTEMS:  See HPI otherwise 14 point ROS is negative.  PHYSICAL EXAMINATION  VS:  T 97.6       P 74      RR 18      BP 132/76      POX 96% room air        WT (Lb) 122  GENERAL: no acute distress, normal body habitus EYES: conjunctivae normal, sclerae normal, normal eye lids MOUTH/THROAT: lips without lesions,no lesions in the mouth,tongue is without lesions,uvula elevates in midline NECK: the patient has a soft cervical collar in place   LYMPHATICS: no LAN in the neck, no supraclavicular LAN RESPIRATORY: breathing is even & unlabored, BS  CTAB CARDIAC: RRR, no murmur,no extra heart sounds, no edema GI:  ABDOMEN: abdomen soft, normal BS, no masses, no tenderness  LIVER/SPLEEN: no hepatomegaly, no splenomegaly MUSCULOSKELETAL: HEAD: normal to inspection & palpation BACK: no kyphosis, scoliosis or spinal processes tenderness EXTREMITIES: LEFT UPPER EXTREMITY: strength intact, range of motion moderate   RIGHT UPPER EXTREMITY: strength intact, range of motion moderate   LEFT LOWER EXTREMITY: strength intact, range of motion moderate  RIGHT LOWER EXTREMITY: strength intact, range of motion moderate   PSYCHIATRIC: the patient is alert & oriented to person, affect & behavior appropriate  LABS/RADIOLOGY:  None received from hospitalization.     ASSESSMENT/PLAN:  Cervical stenosis.   Status post decompressive laminectomy.  Continue rehabilitation.    Hypertension.  Well controlled.    Hypothyroidism.  Continue levothyroxine.    Constipation.  Continue current laxatives.    GERD.  Well controlled.     Asthma.  Well compensated.    Check CBC and BMP.    I have reviewed patient's medical records received at admission/from hospitalization.  CPT CODE: 40981

## 2012-12-05 ENCOUNTER — Ambulatory Visit
Admission: RE | Admit: 2012-12-05 | Discharge: 2012-12-05 | Disposition: A | Discharge status: Home / Self Care | Payer: PRIVATE HEALTH INSURANCE | Source: Ambulatory Visit | Attending: Internal Medicine | Admitting: Internal Medicine

## 2012-12-05 ENCOUNTER — Other Ambulatory Visit: Payer: Self-pay | Admitting: Internal Medicine

## 2012-12-05 ENCOUNTER — Ambulatory Visit
Admission: RE | Admit: 2012-12-05 | Discharge: 2012-12-05 | Disposition: A | Discharge status: Home / Self Care | Payer: Medicare Other | Source: Ambulatory Visit | Attending: Internal Medicine | Admitting: Internal Medicine

## 2012-12-05 DIAGNOSIS — R0602 Shortness of breath: Secondary | ICD-10-CM

## 2012-12-05 DIAGNOSIS — R1032 Left lower quadrant pain: Secondary | ICD-10-CM

## 2012-12-05 MED ORDER — IOHEXOL 300 MG/ML  SOLN
30.0000 mL | Freq: Once | INTRAMUSCULAR | Status: AC | PRN
  Administered 2012-12-05: 30 mL via ORAL

## 2012-12-05 MED ORDER — IOHEXOL 350 MG/ML SOLN
125.0000 mL | Freq: Once | INTRAVENOUS | Status: AC | PRN
  Administered 2012-12-05: 125 mL via INTRAVENOUS

## 2012-12-29 ENCOUNTER — Other Ambulatory Visit: Payer: Self-pay | Admitting: Adult Health

## 2013-01-12 ENCOUNTER — Other Ambulatory Visit: Payer: Self-pay

## 2013-01-25 ENCOUNTER — Other Ambulatory Visit: Payer: Self-pay | Admitting: Adult Health

## 2013-02-20 ENCOUNTER — Other Ambulatory Visit: Payer: Self-pay | Admitting: Neurosurgery

## 2013-02-20 DIAGNOSIS — M549 Dorsalgia, unspecified: Secondary | ICD-10-CM

## 2013-02-21 ENCOUNTER — Ambulatory Visit
Admission: RE | Admit: 2013-02-21 | Discharge: 2013-02-21 | Disposition: A | Discharge status: Home / Self Care | Payer: PRIVATE HEALTH INSURANCE | Source: Ambulatory Visit | Attending: Neurosurgery | Admitting: Neurosurgery

## 2013-02-21 DIAGNOSIS — M549 Dorsalgia, unspecified: Secondary | ICD-10-CM

## 2013-02-21 MED ORDER — IOHEXOL 180 MG/ML  SOLN
1.0000 mL | Freq: Once | INTRAMUSCULAR | Status: AC | PRN
  Administered 2013-02-21: 1 mL via EPIDURAL

## 2013-02-21 MED ORDER — METHYLPREDNISOLONE ACETATE 40 MG/ML INJ SUSP (RADIOLOG
120.0000 mg | Freq: Once | INTRAMUSCULAR | Status: AC
  Administered 2013-02-21: 120 mg via EPIDURAL

## 2013-05-29 ENCOUNTER — Other Ambulatory Visit: Payer: Self-pay | Admitting: Internal Medicine

## 2013-05-29 DIAGNOSIS — M549 Dorsalgia, unspecified: Secondary | ICD-10-CM

## 2013-06-05 ENCOUNTER — Ambulatory Visit
Admission: RE | Admit: 2013-06-05 | Discharge: 2013-06-05 | Disposition: A | Discharge status: Home / Self Care | Payer: PRIVATE HEALTH INSURANCE | Source: Ambulatory Visit | Attending: Internal Medicine | Admitting: Internal Medicine

## 2013-06-05 VITALS — BP 109/68 | HR 72

## 2013-06-05 DIAGNOSIS — M549 Dorsalgia, unspecified: Secondary | ICD-10-CM

## 2013-06-05 MED ORDER — METHYLPREDNISOLONE ACETATE 40 MG/ML INJ SUSP (RADIOLOG
120.0000 mg | Freq: Once | INTRAMUSCULAR | Status: AC
  Administered 2013-06-05: 120 mg via EPIDURAL

## 2013-06-05 MED ORDER — IOHEXOL 180 MG/ML  SOLN
1.0000 mL | Freq: Once | INTRAMUSCULAR | Status: AC | PRN
  Administered 2013-06-05: 1 mL via EPIDURAL

## 2013-06-13 ENCOUNTER — Other Ambulatory Visit: Payer: Self-pay | Admitting: Internal Medicine

## 2013-06-13 DIAGNOSIS — Z1231 Encounter for screening mammogram for malignant neoplasm of breast: Secondary | ICD-10-CM

## 2013-07-04 ENCOUNTER — Ambulatory Visit
Admission: RE | Admit: 2013-07-04 | Discharge: 2013-07-04 | Disposition: A | Discharge status: Home / Self Care | Payer: Medicare Other | Source: Ambulatory Visit | Attending: Internal Medicine | Admitting: Internal Medicine

## 2013-07-04 ENCOUNTER — Other Ambulatory Visit: Payer: Self-pay | Admitting: Internal Medicine

## 2013-07-04 ENCOUNTER — Ambulatory Visit
Admission: RE | Admit: 2013-07-04 | Discharge: 2013-07-04 | Disposition: A | Discharge status: Home / Self Care | Payer: PRIVATE HEALTH INSURANCE | Source: Ambulatory Visit | Attending: Internal Medicine | Admitting: Internal Medicine

## 2013-07-04 DIAGNOSIS — Z1231 Encounter for screening mammogram for malignant neoplasm of breast: Secondary | ICD-10-CM

## 2013-12-22 ENCOUNTER — Other Ambulatory Visit: Payer: Self-pay

## 2014-09-03 ENCOUNTER — Other Ambulatory Visit: Payer: Self-pay

## 2014-09-19 ENCOUNTER — Other Ambulatory Visit: Payer: Self-pay

## 2014-09-19 DIAGNOSIS — Z1231 Encounter for screening mammogram for malignant neoplasm of breast: Secondary | ICD-10-CM

## 2014-09-28 ENCOUNTER — Other Ambulatory Visit: Payer: Self-pay | Admitting: Internal Medicine

## 2014-09-28 DIAGNOSIS — R61 Generalized hyperhidrosis: Secondary | ICD-10-CM

## 2014-09-28 DIAGNOSIS — R634 Abnormal weight loss: Secondary | ICD-10-CM

## 2014-10-03 ENCOUNTER — Other Ambulatory Visit: Payer: Self-pay

## 2014-10-04 ENCOUNTER — Other Ambulatory Visit: Payer: Self-pay

## 2014-10-05 ENCOUNTER — Ambulatory Visit
Admission: RE | Admit: 2014-10-05 | Discharge: 2014-10-05 | Disposition: A | Discharge status: Home / Self Care | Payer: Medicare Other | Source: Ambulatory Visit | Attending: Internal Medicine | Admitting: Internal Medicine

## 2014-10-05 ENCOUNTER — Inpatient Hospital Stay: Admission: RE | Admit: 2014-10-05 | Payer: Self-pay | Source: Ambulatory Visit

## 2014-10-05 DIAGNOSIS — R61 Generalized hyperhidrosis: Secondary | ICD-10-CM

## 2014-10-05 DIAGNOSIS — R634 Abnormal weight loss: Secondary | ICD-10-CM

## 2014-10-05 MED ORDER — IOPAMIDOL (ISOVUE-300) INJECTION 61%
100.0000 mL | Freq: Once | INTRAVENOUS | Status: AC | PRN
  Administered 2014-10-05: 100 mL via INTRAVENOUS

## 2014-10-30 ENCOUNTER — Ambulatory Visit: Payer: Self-pay

## 2014-11-09 ENCOUNTER — Ambulatory Visit: Payer: Self-pay

## 2014-11-27 ENCOUNTER — Other Ambulatory Visit: Payer: Self-pay | Admitting: Internal Medicine

## 2014-11-27 DIAGNOSIS — M545 Low back pain: Principal | ICD-10-CM

## 2014-11-27 DIAGNOSIS — G8929 Other chronic pain: Secondary | ICD-10-CM

## 2014-12-04 ENCOUNTER — Ambulatory Visit
Admission: RE | Admit: 2014-12-04 | Discharge: 2014-12-04 | Disposition: A | Discharge status: Home / Self Care | Payer: Medicare Other | Source: Ambulatory Visit | Attending: Internal Medicine | Admitting: Internal Medicine

## 2014-12-04 DIAGNOSIS — M545 Low back pain: Principal | ICD-10-CM

## 2014-12-04 DIAGNOSIS — G8929 Other chronic pain: Secondary | ICD-10-CM

## 2014-12-04 MED ORDER — METHYLPREDNISOLONE ACETATE 40 MG/ML INJ SUSP (RADIOLOG
120.0000 mg | Freq: Once | INTRAMUSCULAR | Status: AC
  Administered 2014-12-04: 120 mg via EPIDURAL

## 2014-12-04 MED ORDER — IOHEXOL 180 MG/ML  SOLN
1.0000 mL | Freq: Once | INTRAMUSCULAR | Status: DC | PRN
  Administered 2014-12-04: 1 mL via EPIDURAL

## 2014-12-12 ENCOUNTER — Ambulatory Visit: Payer: Self-pay

## 2014-12-17 ENCOUNTER — Ambulatory Visit: Payer: Self-pay

## 2015-01-10 ENCOUNTER — Ambulatory Visit
Admission: RE | Admit: 2015-01-10 | Discharge: 2015-01-10 | Disposition: A | Discharge status: Home / Self Care | Payer: Medicare Other | Source: Ambulatory Visit

## 2015-01-10 DIAGNOSIS — Z1231 Encounter for screening mammogram for malignant neoplasm of breast: Secondary | ICD-10-CM

## 2015-04-01 ENCOUNTER — Other Ambulatory Visit: Payer: Self-pay | Admitting: Internal Medicine

## 2015-04-01 DIAGNOSIS — R61 Generalized hyperhidrosis: Secondary | ICD-10-CM

## 2015-04-01 DIAGNOSIS — R5383 Other fatigue: Secondary | ICD-10-CM

## 2015-04-01 DIAGNOSIS — R946 Abnormal results of thyroid function studies: Secondary | ICD-10-CM

## 2015-04-04 ENCOUNTER — Ambulatory Visit
Admission: RE | Admit: 2015-04-04 | Discharge: 2015-04-04 | Disposition: A | Discharge status: Home / Self Care | Payer: Medicare Other | Source: Ambulatory Visit | Attending: Internal Medicine | Admitting: Internal Medicine

## 2015-04-04 DIAGNOSIS — R61 Generalized hyperhidrosis: Secondary | ICD-10-CM

## 2015-04-04 DIAGNOSIS — R946 Abnormal results of thyroid function studies: Secondary | ICD-10-CM

## 2015-04-04 DIAGNOSIS — R5383 Other fatigue: Secondary | ICD-10-CM

## 2015-05-15 ENCOUNTER — Other Ambulatory Visit: Payer: Self-pay | Admitting: Internal Medicine

## 2015-05-15 DIAGNOSIS — M545 Low back pain, unspecified: Secondary | ICD-10-CM

## 2015-05-15 DIAGNOSIS — G8929 Other chronic pain: Secondary | ICD-10-CM

## 2015-05-21 ENCOUNTER — Ambulatory Visit
Admission: RE | Admit: 2015-05-21 | Discharge: 2015-05-21 | Disposition: A | Discharge status: Home / Self Care | Payer: Medicare Other | Source: Ambulatory Visit | Attending: Internal Medicine | Admitting: Internal Medicine

## 2015-05-21 DIAGNOSIS — G8929 Other chronic pain: Secondary | ICD-10-CM

## 2015-05-21 DIAGNOSIS — M545 Low back pain: Principal | ICD-10-CM

## 2015-05-21 MED ORDER — IOHEXOL 180 MG/ML  SOLN
1.0000 mL | Freq: Once | INTRAMUSCULAR | Status: AC | PRN
  Administered 2015-05-21: 1 mL via EPIDURAL

## 2015-05-21 MED ORDER — METHYLPREDNISOLONE ACETATE 40 MG/ML INJ SUSP (RADIOLOG
120.0000 mg | Freq: Once | INTRAMUSCULAR | Status: AC
  Administered 2015-05-21: 120 mg via EPIDURAL

## 2015-05-21 NOTE — Discharge Instructions (Signed)

## 2015-08-17 ENCOUNTER — Encounter (HOSPITAL_COMMUNITY): Payer: Self-pay | Admitting: Emergency Medicine

## 2015-08-17 ENCOUNTER — Emergency Department (HOSPITAL_COMMUNITY)
Admission: EM | Admit: 2015-08-17 | Discharge: 2015-08-17 | Disposition: A | Discharge status: Home / Self Care | Payer: Medicare Other | Attending: Emergency Medicine | Admitting: Emergency Medicine

## 2015-08-17 ENCOUNTER — Emergency Department (HOSPITAL_COMMUNITY): Payer: Medicare Other

## 2015-08-17 DIAGNOSIS — Z90710 Acquired absence of both cervix and uterus: Secondary | ICD-10-CM | POA: Diagnosis not present

## 2015-08-17 DIAGNOSIS — R109 Unspecified abdominal pain: Secondary | ICD-10-CM

## 2015-08-17 DIAGNOSIS — R1084 Generalized abdominal pain: Secondary | ICD-10-CM | POA: Diagnosis present

## 2015-08-17 DIAGNOSIS — I10 Essential (primary) hypertension: Secondary | ICD-10-CM | POA: Diagnosis not present

## 2015-08-17 DIAGNOSIS — Z79899 Other long term (current) drug therapy: Secondary | ICD-10-CM | POA: Insufficient documentation

## 2015-08-17 DIAGNOSIS — J45909 Unspecified asthma, uncomplicated: Secondary | ICD-10-CM | POA: Insufficient documentation

## 2015-08-17 DIAGNOSIS — R197 Diarrhea, unspecified: Secondary | ICD-10-CM | POA: Insufficient documentation

## 2015-08-17 DIAGNOSIS — R112 Nausea with vomiting, unspecified: Secondary | ICD-10-CM

## 2015-08-17 HISTORY — DX: Essential (primary) hypertension: I10

## 2015-08-17 HISTORY — DX: Diverticulosis of intestine, part unspecified, without perforation or abscess without bleeding: K57.90

## 2015-08-17 LAB — COMPREHENSIVE METABOLIC PANEL
ALBUMIN: 3.9 g/dL (ref 3.5–5.0)
ALT: 24 U/L (ref 14–54)
ANION GAP: 9 (ref 5–15)
AST: 30 U/L (ref 15–41)
Alkaline Phosphatase: 52 U/L (ref 38–126)
BILIRUBIN TOTAL: 0.6 mg/dL (ref 0.3–1.2)
BUN: 16 mg/dL (ref 6–20)
CHLORIDE: 107 mmol/L (ref 101–111)
CO2: 19 mmol/L — ABNORMAL LOW (ref 22–32)
Calcium: 8.6 mg/dL — ABNORMAL LOW (ref 8.9–10.3)
Creatinine, Ser: 0.81 mg/dL (ref 0.44–1.00)
GFR calc Af Amer: >60 mL/min (ref >60)
GFR calc non Af Amer: >60 mL/min (ref >60)
Glucose, Bld: 111 mg/dL — ABNORMAL HIGH (ref 65–99)
Potassium: 2.9 mmol/L — ABNORMAL LOW (ref 3.5–5.1)
SODIUM: 135 mmol/L (ref 135–145)
TOTAL PROTEIN: 6.6 g/dL (ref 6.5–8.1)

## 2015-08-17 LAB — URINALYSIS, ROUTINE W REFLEX MICROSCOPIC
Bilirubin Urine: NEGATIVE
Glucose, UA: NEGATIVE mg/dL
Hgb urine dipstick: NEGATIVE
Ketones, ur: NEGATIVE mg/dL
LEUKOCYTES UA: NEGATIVE
NITRITE: NEGATIVE
PH: 5.5 (ref 5.0–8.0)
Protein, ur: NEGATIVE mg/dL
Specific Gravity, Urine: 1.033 — ABNORMAL HIGH (ref 1.005–1.030)

## 2015-08-17 LAB — LIPASE, BLOOD: LIPASE: 25 U/L (ref 11–51)

## 2015-08-17 LAB — CBC
HEMATOCRIT: 38.4 % (ref 36.0–46.0)
Hemoglobin: 13.5 g/dL (ref 12.0–15.0)
MCH: 31.2 pg (ref 26.0–34.0)
MCHC: 35.2 g/dL (ref 30.0–36.0)
MCV: 88.7 fL (ref 78.0–100.0)
Platelets: 206 10*3/uL (ref 150–400)
RBC: 4.33 MIL/uL (ref 3.87–5.11)
RDW: 13.9 % (ref 11.5–15.5)
WBC: 7.3 10*3/uL (ref 4.0–10.5)

## 2015-08-17 MED ORDER — DIATRIZOATE MEGLUMINE & SODIUM 66-10 % PO SOLN
30.0000 mL | Freq: Once | ORAL | Status: AC
Start: 2015-08-17 — End: 2015-08-17
  Administered 2015-08-17: 30 mL via ORAL

## 2015-08-17 MED ORDER — ONDANSETRON HCL 4 MG PO TABS: 4.0000 mg | ORAL_TABLET | Freq: Four times a day (QID) | ORAL | Status: DC

## 2015-08-17 MED ORDER — FAMOTIDINE IN NACL 20-0.9 MG/50ML-% IV SOLN
20.0000 mg | Freq: Once | INTRAVENOUS | Status: AC
  Administered 2015-08-17: 20 mg via INTRAVENOUS
  Filled 2015-08-17: qty 50

## 2015-08-17 MED ORDER — POTASSIUM CHLORIDE 10 MEQ/100ML IV SOLN
10.0000 meq | Freq: Once | INTRAVENOUS | Status: AC
  Administered 2015-08-17: 10 meq via INTRAVENOUS
  Filled 2015-08-17: qty 100

## 2015-08-17 MED ORDER — LOPERAMIDE HCL 2 MG PO CAPS
4.0000 mg | ORAL_CAPSULE | Freq: Once | ORAL | Status: AC
  Administered 2015-08-17: 4 mg via ORAL
  Filled 2015-08-17: qty 2

## 2015-08-17 MED ORDER — POTASSIUM CHLORIDE CRYS ER 20 MEQ PO TBCR
40.0000 meq | EXTENDED_RELEASE_TABLET | Freq: Once | ORAL | Status: AC
  Administered 2015-08-17: 40 meq via ORAL
  Filled 2015-08-17: qty 2

## 2015-08-17 MED ORDER — DICYCLOMINE HCL 10 MG/ML IM SOLN: 20.0000 mg | Freq: Once | INTRAMUSCULAR | Status: DC

## 2015-08-17 MED ORDER — IOPAMIDOL (ISOVUE-300) INJECTION 61%
100.0000 mL | Freq: Once | INTRAVENOUS | Status: AC | PRN
  Administered 2015-08-17: 100 mL via INTRAVENOUS

## 2015-08-17 MED ORDER — ONDANSETRON HCL 4 MG/2ML IJ SOLN
4.0000 mg | INTRAMUSCULAR | Status: DC | PRN
  Administered 2015-08-17: 4 mg via INTRAVENOUS
  Filled 2015-08-17: qty 2

## 2015-08-17 MED ORDER — MORPHINE SULFATE (PF) 2 MG/ML IV SOLN: 2.0000 mg | INTRAVENOUS | Status: DC | PRN

## 2015-08-17 MED ORDER — SODIUM CHLORIDE 0.9 % IV SOLN
INTRAVENOUS | Status: DC
  Administered 2015-08-17: 18:00:00 via INTRAVENOUS

## 2015-08-17 MED ORDER — SODIUM CHLORIDE 0.9 % IV BOLUS (SEPSIS)
500.0000 mL | Freq: Once | INTRAVENOUS | Status: AC
  Administered 2015-08-17: 500 mL via INTRAVENOUS

## 2015-08-17 NOTE — ED Provider Notes (Signed)
CSN: [id number]     Arrival date & time 08/17/15  1538 History   First MD Initiated Contact with Patient 08/17/15 1547     Chief Complaint  Patient presents with  . Diarrhea  . Emesis     HPI Pt was seen at 1550. Per pt, c/o gradual onset and persistence of multiple intermittent episodes of N/V/D that began yesterday morning at 0500.  Describes the stools as "watery." Has been associated with generalized abd "pan." Denies CP/SOB, no back pain, no fevers, no black or blood in stools or emesis.     Past Medical History  Diagnosis Date  . Back pain, chronic   . Spinal stenosis   . Asthma   . Anemia   . History of hysterectomy   . Synovial cyst   . Hypertension    Past Surgical History  Procedure Laterality Date  . Back surgery    . Cyst removal trunk    . Abdominal hysterectomy      Social History  Substance Use Topics  . Smoking status: Never Smoker   . Smokeless tobacco: Never Used  . Alcohol Use: Yes     Comment: socially    Review of Systems ROS: Statement: All systems negative except as marked or noted in the HPI; Constitutional: Negative for fever and chills. ; ; Eyes: Negative for eye pain, redness and discharge. ; ; ENMT: Negative for ear pain, hoarseness, nasal congestion, sinus pressure and sore throat. ; ; Cardiovascular: Negative for chest pain, palpitations, diaphoresis, dyspnea and peripheral edema. ; ; Respiratory: Negative for cough, wheezing and stridor. ; ; Gastrointestinal: +N/V/D, abd pain. Negative for blood in stool, hematemesis, jaundice and rectal bleeding. . ; ; Genitourinary: Negative for dysuria, flank pain and hematuria. ; ; Musculoskeletal: Negative for back pain and neck pain. Negative for swelling and trauma.; ; Skin: Negative for pruritus, rash, abrasions, blisters, bruising and skin lesion.; ; Neuro: Negative for headache, lightheadedness and neck stiffness. Negative for weakness, altered level of consciousness, altered mental status, extremity  weakness, paresthesias, involuntary movement, seizure and syncope.      Allergies  Prochlorperazine edisylate and Other  Home Medications   Prior to Admission medications   Medication Sig Start Date End Date Taking? Authorizing Provider  multivitamin-lutein (OCUVITE-LUTEIN) CAPS capsule Take 1 capsule by mouth daily.   Yes Historical Provider, MD  SYNTHROID 50 MCG tablet Take 50 mcg by mouth daily. 08/13/15  Yes Historical Provider, MD  telmisartan-hydrochlorothiazide (MICARDIS HCT) 80-12.5 MG tablet Take 0.5 tablets by mouth daily. 06/20/15  Yes Historical Provider, MD  vitamin C (ASCORBIC ACID) 500 MG tablet Take 500 mg by mouth daily.   Yes Historical Provider, MD  budesonide-formoterol (SYMBICORT) 80-4.5 MCG/ACT inhaler Inhale 2 puffs into the lungs 2 (two) times daily.    Historical Provider, MD  Calcium-Magnesium-Vitamin D K1323355 MG-MG-UNIT TABS Take 5 tablets by mouth daily.    Historical Provider, MD  cholecalciferol (VITAMIN D) 1000 UNITS tablet Take 2,000 Units by mouth daily.    Historical Provider, MD  clonazePAM (KLONOPIN) 1 MG tablet Take 1 mg by mouth at bedtime as needed for anxiety.    Historical Provider, MD  cycloSPORINE (RESTASIS) 0.05 % ophthalmic emulsion Place 1 drop into both eyes 2 (two) times daily.    Historical Provider, MD  fish oil-omega-3 fatty acids 1000 MG capsule Take 2 g by mouth daily.    Historical Provider, MD  FLUoxetine (PROZAC) 10 MG capsule Take 10 mg by mouth daily.  Historical Provider, MD  gabapentin (NEURONTIN) 300 MG capsule Take 300 mg by mouth 3 (three) times daily.    Historical Provider, MD  glucosamine-chondroitin 500-400 MG tablet Take 1 tablet by mouth 3 (three) times daily.    Historical Provider, MD  HYDROcodone-acetaminophen (NORCO) 10-325 MG per tablet Take 1 tablet by mouth every 8 (eight) hours as needed for pain.    Historical Provider, MD  lansoprazole (PREVACID) 15 MG capsule Take 15 mg by mouth daily.    Historical Provider,  MD  levalbuterol [facility] HFA) 45 MCG/ACT inhaler Inhale 1-2 puffs into the lungs every 4 (four) hours as needed for wheezing.    Historical Provider, MD   BP 152/83 mmHg  Temp(Src) 98.4 F (36.9 C) (Oral)  Resp 18  SpO2 99%   16:09:55 Orthostatic Vital Signs MR  Orthostatic Lying  - BP- Lying: 136/85 mmHg ; Pulse- Lying: 77  Orthostatic Sitting - BP- Sitting: 144/84 mmHg ; Pulse- Sitting: 81  Orthostatic Standing at 0 minutes - BP- Standing at 0 minutes: 123/85 mmHg ; Pulse- Standing at 0 minutes: 85      Physical Exam  1555: Physical examination:  Nursing notes reviewed; Vital signs and O2 SAT reviewed;  Constitutional: Well developed, Well nourished, Uncomfortable appearing.;; Head:  Normocephalic, atraumatic; Eyes: EOMI, PERRL, No scleral icterus; ENMT: Mouth and pharynx normal, Mucous membranes dry; Neck: Supple, Full range of motion, No lymphadenopathy; Cardiovascular: Regular rate and rhythm, No gallop; Respiratory: Breath sounds clear & equal bilaterally, No wheezes.  Speaking full sentences with ease, Normal respiratory effort/excursion; Chest: Nontender, Movement normal; Abdomen: Soft, +mild diffuse tenderness to palp. No rebound or guarding. Nondistended, Normal bowel sounds; Genitourinary: No CVA tenderness; Extremities: Pulses normal, No tenderness, No edema, No calf edema or asymmetry.; Neuro: AA&Ox3, Major CN grossly intact.  Speech clear. No gross focal motor or sensory deficits in extremities.; Skin: Color normal, Warm, Dry.   ED Course  Procedures (including critical care time) Labs Review  Imaging Review I have personally reviewed and evaluated these images and lab results as part of my medical decision-making.   EKG Interpretation None      MDM  MDM Reviewed: previous chart, nursing note and vitals Reviewed previous: labs Interpretation: labs and x-ray     Results for orders placed or performed during the hospital encounter of 08/17/15  Lipase, blood   Result Value Ref Range   Lipase 25 11 - 51 U/L  Comprehensive metabolic panel  Result Value Ref Range   Sodium 135 135 - 145 mmol/L   Potassium 2.9 (L) 3.5 - 5.1 mmol/L   Chloride 107 101 - 111 mmol/L   CO2 19 (L) 22 - 32 mmol/L   Glucose, Bld 111 (H) 65 - 99 mg/dL   BUN 16 6 - 20 mg/dL   Creatinine, Ser 0.81 0.44 - 1.00 mg/dL   Calcium 8.6 (L) 8.9 - 10.3 mg/dL   Total Protein 6.6 6.5 - 8.1 g/dL   Albumin 3.9 3.5 - 5.0 g/dL   AST 30 15 - 41 U/L   ALT 24 14 - 54 U/L   Alkaline Phosphatase 52 38 - 126 U/L   Total Bilirubin 0.6 0.3 - 1.2 mg/dL   GFR calc non Af Amer >60 >60 mL/min   GFR calc Af Amer >60 >60 mL/min   Anion gap 9 5 - 15  CBC  Result Value Ref Range   WBC 7.3 4.0 - 10.5 K/uL   RBC 4.33 3.87 - 5.11 MIL/uL   Hemoglobin  13.5 12.0 - 15.0 g/dL   HCT 38.4 36.0 - 46.0 %   MCV 88.7 78.0 - 100.0 fL   MCH 31.2 26.0 - 34.0 pg   MCHC 35.2 30.0 - 36.0 g/dL   RDW 13.9 11.5 - 15.5 %   Platelets 206 150 - 400 K/uL   Dg Chest 2 View 08/17/2015  CLINICAL DATA:  Abdominal pain, nausea, vomiting, diarrhea. EXAM: CHEST  2 VIEW COMPARISON:  12/02/2012 chest radiograph. FINDINGS: Surgical hardware from ACDF overlies the lower cervical spine. Surgical hardware from bilateral posterior spine fusion overlies the cervical spine. Stable cardiomediastinal silhouette with normal heart size. No pneumothorax. No pleural effusion. Lungs appear clear, with no acute consolidative airspace disease and no pulmonary edema. IMPRESSION: No active cardiopulmonary disease. Electronically Signed   By: Ilona Sorrel M.D.   On: 08/17/2015 17:04    1800: Potassium repleted IV and PO. UA and CT A/P pending. Dispo based on results. Sign out to Dr. Roderic Palau.   Francine Graven, DO 08/17/15 1801

## 2015-08-17 NOTE — ED Notes (Addendum)
Pt from home with complaints of diarrhea that began around 0500 yesterday morning. Pt states that she had nausea and emesis yesterday, but not today. Pt states she has slight lightheadedness but denies any abdominal pain at time of assessment.

## 2015-08-17 NOTE — Discharge Instructions (Signed)
Drink plenty of fluids and follow up this week if not improving.

## 2015-08-18 ENCOUNTER — Encounter (HOSPITAL_COMMUNITY): Payer: Self-pay | Admitting: Emergency Medicine

## 2015-09-19 ENCOUNTER — Ambulatory Visit: Payer: Self-pay | Admitting: Family Medicine

## 2016-01-14 ENCOUNTER — Ambulatory Visit (INDEPENDENT_AMBULATORY_CARE_PROVIDER_SITE_OTHER): Payer: Medicare Other | Admitting: Internal Medicine

## 2016-01-14 ENCOUNTER — Other Ambulatory Visit (INDEPENDENT_AMBULATORY_CARE_PROVIDER_SITE_OTHER): Payer: Medicare Other

## 2016-01-14 ENCOUNTER — Encounter: Payer: Self-pay | Admitting: Internal Medicine

## 2016-01-14 VITALS — BP 124/70 | HR 96 | Temp 98.5°F | Resp 16 | Ht <= 58 in | Wt 103.0 lb

## 2016-01-14 DIAGNOSIS — N3281 Overactive bladder: Secondary | ICD-10-CM

## 2016-01-14 DIAGNOSIS — F419 Anxiety disorder, unspecified: Secondary | ICD-10-CM | POA: Diagnosis not present

## 2016-01-14 DIAGNOSIS — M8580 Other specified disorders of bone density and structure, unspecified site: Secondary | ICD-10-CM

## 2016-01-14 DIAGNOSIS — R5383 Other fatigue: Secondary | ICD-10-CM | POA: Diagnosis not present

## 2016-01-14 DIAGNOSIS — M4802 Spinal stenosis, cervical region: Secondary | ICD-10-CM

## 2016-01-14 DIAGNOSIS — M545 Low back pain: Secondary | ICD-10-CM

## 2016-01-14 DIAGNOSIS — F329 Major depressive disorder, single episode, unspecified: Secondary | ICD-10-CM

## 2016-01-14 DIAGNOSIS — I10 Essential (primary) hypertension: Secondary | ICD-10-CM

## 2016-01-14 DIAGNOSIS — F32A Depression, unspecified: Secondary | ICD-10-CM

## 2016-01-14 DIAGNOSIS — E039 Hypothyroidism, unspecified: Secondary | ICD-10-CM | POA: Diagnosis not present

## 2016-01-14 DIAGNOSIS — G8929 Other chronic pain: Secondary | ICD-10-CM

## 2016-01-14 DIAGNOSIS — J4530 Mild persistent asthma, uncomplicated: Secondary | ICD-10-CM | POA: Diagnosis not present

## 2016-01-14 DIAGNOSIS — M5030 Other cervical disc degeneration, unspecified cervical region: Secondary | ICD-10-CM

## 2016-01-14 LAB — CBC WITH DIFFERENTIAL/PLATELET
BASOS ABS: 0 10*3/uL (ref 0.0–0.1)
Basophils Relative: 0.6 % (ref 0.0–3.0)
Eosinophils Absolute: 0.2 10*3/uL (ref 0.0–0.7)
Eosinophils Relative: 2.7 % (ref 0.0–5.0)
HCT: 35.2 % — ABNORMAL LOW (ref 36.0–46.0)
Hemoglobin: 11.9 g/dL — ABNORMAL LOW (ref 12.0–15.0)
LYMPHS ABS: 1.8 10*3/uL (ref 0.7–4.0)
Lymphocytes Relative: 25.7 % (ref 12.0–46.0)
MCHC: 33.9 g/dL (ref 30.0–36.0)
MCV: 91.9 fl (ref 78.0–100.0)
MONO ABS: 0.8 10*3/uL (ref 0.1–1.0)
MONOS PCT: 11.1 % (ref 3.0–12.0)
NEUTROS ABS: 4.1 10*3/uL (ref 1.4–7.7)
NEUTROS PCT: 59.9 % (ref 43.0–77.0)
PLATELETS: 216 10*3/uL (ref 150.0–400.0)
RBC: 3.83 Mil/uL — AB (ref 3.87–5.11)
RDW: 13.3 % (ref 11.5–15.5)
WBC: 6.9 10*3/uL (ref 4.0–10.5)

## 2016-01-14 LAB — COMPREHENSIVE METABOLIC PANEL
ALK PHOS: 65 U/L (ref 39–117)
ALT: 17 U/L (ref 0–35)
AST: 16 U/L (ref 0–37)
Albumin: 4.3 g/dL (ref 3.5–5.2)
BUN: 15 mg/dL (ref 6–23)
CALCIUM: 9.9 mg/dL (ref 8.4–10.5)
CO2: 31 mEq/L (ref 19–32)
CREATININE: 0.88 mg/dL (ref 0.40–1.20)
Chloride: 102 mEq/L (ref 96–112)
GFR: 66.75 mL/min (ref >60.00)
GLUCOSE: 105 mg/dL — AB (ref 70–99)
Potassium: 3.7 mEq/L (ref 3.5–5.1)
Sodium: 139 mEq/L (ref 135–145)
Total Bilirubin: 0.6 mg/dL (ref 0.2–1.2)
Total Protein: 7 g/dL (ref 6.0–8.3)

## 2016-01-14 LAB — TSH: TSH: 0.81 u[IU]/mL (ref 0.35–4.50)

## 2016-01-14 LAB — T4, FREE: FREE T4: 1.07 ng/dL (ref 0.60–1.60)

## 2016-01-14 MED ORDER — TELMISARTAN-HCTZ 40-12.5 MG PO TABS: 1.0000 | ORAL_TABLET | Freq: Every day | ORAL | 5 refills | Status: DC

## 2016-01-14 MED ORDER — SYNTHROID 75 MCG PO TABS: 75.0000 ug | ORAL_TABLET | Freq: Every day | ORAL | Status: DC

## 2016-01-14 NOTE — Assessment & Plan Note (Addendum)
Taking vesicare daily and it does help Continue above Take hctz in morning

## 2016-01-14 NOTE — Patient Instructions (Addendum)

## 2016-01-14 NOTE — Assessment & Plan Note (Addendum)
Uses symbicort twice daily Uses xopenex infrequently Asthma controlled Continue above

## 2016-01-14 NOTE — Progress Notes (Signed)
Pre visit review using our clinic review tool, if applicable. No additional management support is needed unless otherwise documented below in the visit note. 

## 2016-01-14 NOTE — Assessment & Plan Note (Signed)
Has pain intermittent Takes hydrocodone - mostly for back but also for neck

## 2016-01-14 NOTE — Assessment & Plan Note (Addendum)
Taking cymbalta daily and clonazepam nightly for anxiety/sleep She denies side effects Discussed concerns with long term use of BDZ - memory concerns,  Increase risk of falls

## 2016-01-14 NOTE — Assessment & Plan Note (Signed)
Controlled, stable Continue current dose of cymbalta

## 2016-01-14 NOTE — Progress Notes (Signed)
Subjective:    Patient ID: Tonya [name], female    DOB: January 28, 1941, 75 y.o.   MRN: [id number]  HPI She is here to establish with a new pcp.    Fatigue:  She feels more tired for almost a year.  She walks the dog for exercise.  she was doing more exercise in the past, but has not done that for a while.  She knows she should.  She sleeps 7-8 hours a night, but it is not always a deep sleep.  She is tired when she wakes up.  Her thyroid dose was adjust earlier this year and that helped temporarily.    Hypertension: She is taking her medication daily. She is compliant with a low sodium diet.  She denies chest pain, palpitations, and regular headaches. She gets SOB with walking up inclines, but no SOB any other time.  She is exercising minimally.     Hypothyroidism:  She is taking her medication daily.  She denies any recent changes in energy or weight that are unexplained.  Her fatigue has been going on for a long time - adjustment to her thyroid dose this year helped, but only temporarily.   Asthma:  She does not know her triggers.  She takes the symbicort twice daily and uses the rescue inhaler infrequently.  She denies wheezing and coughing on a regular basis.    Overactive bladder:  She takes vesicare daily.  The medication does help. She does take her BP medication at night (contains hctz).  Chronic back pain, neck pain:  She has had neck surgery in the past.  She takes hydrocodone mostly for the back, some for the neck.  She takes 2 in the morning and 2 at night most days.  She occ has lumbar radiculopathy in the right leg and take gabapentin on occasion.   Depression, anxiety: She is taking her medication daily as prescribed - Cymbalta and clonazepam. She denies any side effects from the medication. She feels her depression and anxiety are well controlled and she is happy with her current dose of medication.     Medications and allergies reviewed with patient and updated if  appropriate.  Patient Active Problem List   Diagnosis Date Noted  . Cervical pain 11/22/2012  . H/O arthrodesis 11/22/2012  . DDD (degenerative disc disease), cervical S/P Decompressive laminectomy and Fusion 11/15/2012  . Asthma 11/15/2012  . Hypothyroidism 11/15/2012  . Depression 11/15/2012  . Constipation 11/15/2012  . Overactive bladder 11/15/2012  . Anxiety 11/15/2012  . Cervical spinal stenosis 10/24/2012  . Fracture of cervical vertebra ([place]) 08/31/2012  . Scoliosis 07/05/2012  . Degeneration of intervertebral disc of lumbosacral region 07/04/2012  . COUGH 07/08/2007  . GERD 06/02/2007  . HYPERTENSION, BENIGN 04/21/2007  . DYSPNEA 04/21/2007    Current Outpatient Prescriptions on File Prior to Visit  Medication Sig Dispense Refill  . budesonide-formoterol (SYMBICORT) 80-4.5 MCG/ACT inhaler Inhale 2 puffs into the lungs 2 (two) times daily.    . Calcium-Magnesium-Vitamin D [id number] MG-MG-UNIT TABS Take 5 tablets by mouth daily.    . cholecalciferol (VITAMIN D) 1000 UNITS tablet Take 2,000 Units by mouth daily.    . clonazePAM (KLONOPIN) 1 MG tablet Take 0.5 mg by mouth at bedtime as needed for anxiety.     . cycloSPORINE (RESTASIS) 0.05 % ophthalmic emulsion Place 1 drop into both eyes 2 (two) times daily.    . fish oil-omega-3 fatty acids 1000 MG capsule Take 2 g by mouth  daily.    . gabapentin (NEURONTIN) 300 MG capsule Take 300 mg by mouth daily as needed (pain).     Marland Kitchen glucosamine-chondroitin 500-400 MG tablet Take 1 tablet by mouth 3 (three) times daily.    Marland Kitchen HYDROcodone-acetaminophen (NORCO) 10-325 MG per tablet Take 1 tablet by mouth every 8 (eight) hours as needed for pain.    Marland Kitchen levalbuterol (XOPENEX HFA) 45 MCG/ACT inhaler Inhale 1-2 puffs into the lungs every 4 (four) hours as needed for wheezing.    . multivitamin-lutein (OCUVITE-LUTEIN) CAPS capsule Take 1 capsule by mouth daily.    Marland Kitchen telmisartan-hydrochlorothiazide (MICARDIS HCT) 80-12.5 MG tablet Take 0.5  tablets by mouth daily.  6  . VESICARE 10 MG tablet Take 10 mg by mouth daily.  6  . vitamin C (ASCORBIC ACID) 500 MG tablet Take 500 mg by mouth daily.     No current facility-administered medications on file prior to visit.     Past Medical History:  Diagnosis Date  . Anemia   . Asthma   . Back pain, chronic   . Diverticulosis   . History of hysterectomy   . Hypertension   . Spinal stenosis   . Synovial cyst     Past Surgical History:  Procedure Laterality Date  . ABDOMINAL HYSTERECTOMY    . BACK SURGERY    . CYST REMOVAL TRUNK      Social History   Social History  . Marital status: Widowed    Spouse name: N/A  . Number of children: N/A  . Years of education: N/A   Social History Main Topics  . Smoking status: Never Smoker  . Smokeless tobacco: Never Used  . Alcohol use Yes     Comment: socially  . Drug use: No  . Sexual activity: Not on file   Other Topics Concern  . Not on file   Social History Narrative  . No narrative on file    No family history on file.  Review of Systems  Constitutional: Positive for fatigue. Negative for chills and fever.  Respiratory: Positive for shortness of breath (walking up incline). Negative for cough and wheezing.   Cardiovascular: Positive for leg swelling (occ). Negative for chest pain and palpitations.  Gastrointestinal: Positive for nausea (occ - related to skipping meals or eating dairy). Negative for abdominal pain, blood in stool, constipation and diarrhea.       Occ gerd  Endocrine: Positive for cold intolerance.  Genitourinary: Negative for dysuria and hematuria.  Musculoskeletal: Positive for back pain and neck pain.  Neurological: Negative for light-headedness and headaches.  Psychiatric/Behavioral: Positive for dysphoric mood. The patient is nervous/anxious.        Objective:   Vitals:   01/14/16 1020  BP: 124/70  Pulse: 96  Resp: 16  Temp: 98.5 F (36.9 C)   Filed Weights   01/14/16 1020    Weight: 103 lb (46.7 kg)   Body mass index is 22.29 kg/m.   Physical Exam Constitutional: She appears well-developed and well-nourished. No distress.  HENT:  Head: Normocephalic and atraumatic.  Right Ear: External ear normal. Normal ear canal and TM Left Ear: External ear normal.  Normal ear canal and TM Mouth/Throat: Oropharynx is clear and moist.  Eyes: Conjunctivae  normal.  Neck: Neck supple. No tracheal deviation present. No thyromegaly present.  No carotid bruit  Cardiovascular: Normal rate, regular rhythm and normal heart sounds.   No murmur heard.  No edema. Pulmonary/Chest: Effort normal and breath sounds normal. No respiratory  distress. She has no wheezes. She has no rales.  Abdominal: Soft. She exhibits no distension. There is no tenderness.  Lymphadenopathy: She has no cervical adenopathy.  Skin: Skin is warm and dry. She is not diaphoretic.  Psychiatric: She has a normal mood and affect. Her behavior is normal.       Assessment & Plan:   See Problem List for Assessment and Plan of chronic medical problems.  F/u in 6 months, sooner if fatigue does not improve/worsens

## 2016-01-15 DIAGNOSIS — M545 Low back pain: Secondary | ICD-10-CM

## 2016-01-15 DIAGNOSIS — R5383 Other fatigue: Secondary | ICD-10-CM | POA: Insufficient documentation

## 2016-01-15 DIAGNOSIS — G8929 Other chronic pain: Secondary | ICD-10-CM | POA: Insufficient documentation

## 2016-01-15 NOTE — Assessment & Plan Note (Signed)
Check tsh  Titrate med dose if needed  

## 2016-01-15 NOTE — Assessment & Plan Note (Signed)
Has some pain Takes hydrocodone twice daily for neck and back pain

## 2016-01-15 NOTE — Assessment & Plan Note (Signed)
Chronic, daily occ radiculopathy down right leg Takes hydrocodone twice daily Takes gabapentin only as needed Advised increased exercise

## 2016-01-15 NOTE — Assessment & Plan Note (Signed)
Taking calcium in MVI and vitamin d Increase exercise Will check when last dexa was and order at her next visit if needed

## 2016-01-15 NOTE — Assessment & Plan Note (Signed)
BP well controlled Current regimen effective and well tolerated Continue current medications at current doses cmp  

## 2016-01-15 NOTE — Assessment & Plan Note (Signed)
?   Etiology - likely multifactorial Check labs, tsh  - adjust meds if needed Encouraged regular exercise besides walking dog May need sleep evaluation since she wakes up tired - likely poor quality sleep, ? OSA Discussed her meds may be contributing to her fatigue She feels her depression is controlled, but there may be underlying depression contributing as well

## 2016-01-16 ENCOUNTER — Encounter: Payer: Self-pay | Admitting: Internal Medicine

## 2016-02-04 ENCOUNTER — Telehealth: Payer: Self-pay | Admitting: *Deleted

## 2016-02-04 NOTE — Telephone Encounter (Signed)
[place] controlled substance database checked.  Ok to fill medication.  

## 2016-02-04 NOTE — Telephone Encounter (Signed)
Rec'd call pt is requesting refill on her hydrocodone...Tonya [name]

## 2016-02-05 MED ORDER — HYDROCODONE-ACETAMINOPHEN 10-325 MG PO TABS: 1.0000 | ORAL_TABLET | Freq: Three times a day (TID) | ORAL | 0 refills | Status: DC | PRN

## 2016-02-05 NOTE — Telephone Encounter (Signed)
Called pt no answer [facility] rx ready for pick-up.../lmb 

## 2016-02-20 ENCOUNTER — Telehealth: Payer: Self-pay | Admitting: *Deleted

## 2016-02-20 NOTE — Telephone Encounter (Signed)
Left msg on triage stating she would like enough hydrocodone to last for a whole month. She states rx that was givien only lasting for 15 days. Pt is requesting refill on hydrocodone...[name]

## 2016-02-20 NOTE — Telephone Encounter (Signed)
Ok to give one month supply - [place] controlled substance database checked - fill for 120 pills - 2 tabs twice daily

## 2016-02-21 MED ORDER — HYDROCODONE-ACETAMINOPHEN 10-325 MG PO TABS: 2.0000 | ORAL_TABLET | Freq: Two times a day (BID) | ORAL | 0 refills | Status: DC

## 2016-02-21 NOTE — Telephone Encounter (Signed)
Generated rx, MD signed notified pt rx ready for pick-up...[name]

## 2016-04-02 ENCOUNTER — Telehealth: Payer: Self-pay | Admitting: *Deleted

## 2016-04-02 MED ORDER — HYDROCODONE-ACETAMINOPHEN 10-325 MG PO TABS: 2.0000 | ORAL_TABLET | Freq: Two times a day (BID) | ORAL | 0 refills | Status: DC

## 2016-04-02 NOTE — Telephone Encounter (Signed)
Given to lucy

## 2016-04-02 NOTE — Telephone Encounter (Signed)
Notified pt rx ready for pick-up.../lmb 

## 2016-04-02 NOTE — Telephone Encounter (Signed)
Done hardcopy to Corinne  

## 2016-04-02 NOTE — Telephone Encounter (Signed)
Pt left msg on triage requesting refill on her Hydrocodone. Dr. Quay Burow is out of the office pls advise on refill...Tonya [name]

## 2016-04-09 ENCOUNTER — Emergency Department (HOSPITAL_COMMUNITY)
Admission: EM | Admit: 2016-04-09 | Discharge: 2016-04-09 | Disposition: A | Discharge status: Home / Self Care | Payer: Medicare Other | Attending: Emergency Medicine | Admitting: Emergency Medicine

## 2016-04-09 ENCOUNTER — Encounter (HOSPITAL_COMMUNITY): Payer: Self-pay

## 2016-04-09 DIAGNOSIS — S51811A Laceration without foreign body of right forearm, initial encounter: Secondary | ICD-10-CM | POA: Diagnosis not present

## 2016-04-09 DIAGNOSIS — I10 Essential (primary) hypertension: Secondary | ICD-10-CM | POA: Diagnosis not present

## 2016-04-09 DIAGNOSIS — Z79899 Other long term (current) drug therapy: Secondary | ICD-10-CM | POA: Insufficient documentation

## 2016-04-09 DIAGNOSIS — W0110XA Fall on same level from slipping, tripping and stumbling with subsequent striking against unspecified object, initial encounter: Secondary | ICD-10-CM | POA: Diagnosis not present

## 2016-04-09 DIAGNOSIS — J45909 Unspecified asthma, uncomplicated: Secondary | ICD-10-CM | POA: Diagnosis not present

## 2016-04-09 DIAGNOSIS — Y9301 Activity, walking, marching and hiking: Secondary | ICD-10-CM | POA: Insufficient documentation

## 2016-04-09 DIAGNOSIS — S59911A Unspecified injury of right forearm, initial encounter: Secondary | ICD-10-CM | POA: Diagnosis present

## 2016-04-09 DIAGNOSIS — Y9259 Other trade areas as the place of occurrence of the external cause: Secondary | ICD-10-CM | POA: Diagnosis not present

## 2016-04-09 DIAGNOSIS — E039 Hypothyroidism, unspecified: Secondary | ICD-10-CM | POA: Diagnosis not present

## 2016-04-09 DIAGNOSIS — Y999 Unspecified external cause status: Secondary | ICD-10-CM | POA: Diagnosis not present

## 2016-04-09 DIAGNOSIS — S41111A Laceration without foreign body of right upper arm, initial encounter: Secondary | ICD-10-CM

## 2016-04-09 MED ORDER — LIDOCAINE HCL 2 % IJ SOLN
20.0000 mL | Freq: Once | INTRAMUSCULAR | Status: AC
  Administered 2016-04-09: 400 mg via INTRADERMAL
  Filled 2016-04-09: qty 20

## 2016-04-09 NOTE — ED Provider Notes (Signed)
[facility] DEPT Provider Note   CSN: [id number] Arrival date & time: 04/09/16  2123 By signing my name below, I, Tonya [name], attest that this documentation has been prepared under the direction and in the presence of Tonya [name], [place]. Electronically Signed: Georgette [name], ED Scribe. 04/09/16. 10:17 PM.  History   Chief Complaint Chief Complaint  Patient presents with  . Extremity Laceration    HPI The history is provided by the patient. No language interpreter was used.   HPI Comments: Tonya [name] is a 76 y.o. female with h/o asthma and HTN, who is right hand dominant, presents to the Emergency Department complaining of a laceration to the right forearm following a mechanical fall ~8:30 pm tonight. Pt states she was walking in her garage when she tripped and fell, striking her forearm against a bucket. Pt denies LOC or head injury. Bleeding controlled with a bandage. She is not on blood thinners. Denies any additional injuries. Pt further denies shoulder pain, neck pain, focal numbness, or any other associated symptoms. Tetanus UTD.  Past Medical History:  Diagnosis Date  . Anemia   . Asthma   . Back pain, chronic   . Diverticulosis   . H/O arthrodesis 11/22/2012  . History of hysterectomy   . Hypertension   . Spinal stenosis   . Synovial cyst     Patient Active Problem List   Diagnosis Date Noted  . Fatigue 01/15/2016  . Chronic lower back pain 01/15/2016  . Osteopenia 01/14/2016  . DDD (degenerative disc disease), cervical S/P Decompressive laminectomy and Fusion 11/15/2012  . Asthma 11/15/2012  . Hypothyroidism 11/15/2012  . Depression 11/15/2012  . Constipation 11/15/2012  . Overactive bladder 11/15/2012  . Anxiety 11/15/2012  . Cervical spinal stenosis 10/24/2012  . Fracture of cervical vertebra ([place]) 08/31/2012  . Scoliosis 07/05/2012  . Degeneration of intervertebral disc of lumbosacral region 07/04/2012  . HYPERTENSION, BENIGN 04/21/2007    Past  Surgical History:  Procedure Laterality Date  . ABDOMINAL HYSTERECTOMY    . BACK SURGERY    . CYST REMOVAL TRUNK      OB History    No data available       Home Medications    Prior to Admission medications   Medication Sig Start Date End Date Taking? Authorizing Provider  budesonide-formoterol (SYMBICORT) 80-4.5 MCG/ACT inhaler Inhale 2 puffs into the lungs 2 (two) times daily.    Historical Provider, MD  Calcium-Magnesium-Vitamin D [id number] MG-MG-UNIT TABS Take 5 tablets by mouth daily.    Historical Provider, MD  cholecalciferol (VITAMIN D) 1000 UNITS tablet Take 2,000 Units by mouth daily.    Historical Provider, MD  clonazePAM (KLONOPIN) 1 MG tablet Take 0.5 mg by mouth at bedtime as needed for anxiety.     Historical Provider, MD  cycloSPORINE (RESTASIS) 0.05 % ophthalmic emulsion Place 1 drop into both eyes 2 (two) times daily.    Historical Provider, MD  DULoxetine (CYMBALTA) 60 MG capsule Take 60 mg by mouth daily.    Historical Provider, MD  fish oil-omega-3 fatty acids 1000 MG capsule Take 2 g by mouth daily.    Historical Provider, MD  gabapentin (NEURONTIN) 300 MG capsule Take 300 mg by mouth daily as needed (pain).     Historical Provider, MD  glucosamine-chondroitin 500-400 MG tablet Take 1 tablet by mouth 3 (three) times daily.    Historical Provider, MD  HYDROcodone-acetaminophen (NORCO) 10-325 MG tablet Take 2 tablets by mouth 2 (two) times daily. 04/02/16   [name]  Quin Hoop, MD  levalbuterol [facility] HFA) 45 MCG/ACT inhaler Inhale 1-2 puffs into the lungs every 4 (four) hours as needed for wheezing.    Historical Provider, MD  multivitamin-lutein (OCUVITE-LUTEIN) CAPS capsule Take 1 capsule by mouth daily.    Historical Provider, MD  saccharomyces boulardii (FLORASTOR) 250 MG capsule Take 250 mg by mouth 2 (two) times daily.    Historical Provider, MD  SYNTHROID 75 MCG tablet Take 1 tablet (75 mcg total) by mouth daily before breakfast. 01/14/16   [name], MD    telmisartan-hydrochlorothiazide (MICARDIS HCT) 40-12.5 MG tablet Take 1 tablet by mouth daily. 01/14/16   [name], MD  VESICARE 10 MG tablet Take 10 mg by mouth daily. 06/25/15   Historical Provider, MD  vitamin C (ASCORBIC ACID) 500 MG tablet Take 500 mg by mouth daily.    Historical Provider, MD    Family History Family History  Problem Relation Age of Onset  . Other Father     carotid artery disease    Social History Social History  Substance Use Topics  . Smoking status: Never Smoker  . Smokeless tobacco: Never Used  . Alcohol use Yes     Comment: socially     Allergies   Prochlorperazine edisylate and Other   Review of Systems Review of Systems  Constitutional: Negative for activity change, chills and fever.  Musculoskeletal: Negative for arthralgias, back pain, joint swelling and neck pain.  Skin: Positive for wound.  Neurological: Negative for weakness and numbness.   Physical Exam Updated Vital Signs BP 123/78 (BP Location: Left Arm)   Pulse 79   Temp 98 F (36.7 C) (Oral)   Resp 18   Ht 4\' 9"  (1.448 m)   Wt 104 lb (47.2 kg)   SpO2 96%   BMI 22.51 kg/m   Physical Exam  Constitutional: She appears well-developed and well-nourished.  HENT:  Head: Normocephalic.  Eyes: Conjunctivae are normal.  Cardiovascular: Normal rate.   Pulmonary/Chest: Effort normal. No respiratory distress.  Abdominal: She exhibits no distension.  Musculoskeletal: Normal range of motion.  Neurological: She is alert.  Skin: Skin is warm and dry.  Patient with 4 cm, C-shaped skin tear laceration through subcutaneous tissue noted to the right forearm. No nerve, vascular, or tendon injury noted. Wound is clean. No distal numbness or tingling. Full range of motion of wrist, hand, and fingers without pain or difficulty.  Psychiatric: She has a normal mood and affect. Her behavior is normal.  Nursing note and vitals reviewed.  ED Treatments / Results  DIAGNOSTIC  STUDIES: Oxygen Saturation is 96% on RA, adequate by my interpretation.    COORDINATION OF CARE: 10:16 PM Discussed treatment plan with pt at bedside which includes laceration repair and pt agreed to plan.  Procedures .Marland KitchenLaceration Repair Date/Time: 04/09/2016 10:17 PM Performed by: Tonya [name] Authorized by: Tonya [name]   Consent:    Consent obtained:  Verbal   Consent given by:  Patient   Risks discussed:  Infection and poor wound healing Anesthesia (see MAR for exact dosages):    Anesthesia method:  Local infiltration   Local anesthetic:  Lidocaine 2% w/o epi Laceration details:    Location:  Shoulder/arm   Shoulder/arm location:  R lower arm   Length (cm):  4 Repair type:    Repair type:  Simple Pre-procedure details:    Preparation:  Patient was prepped and draped in usual sterile fashion Exploration:    Hemostasis achieved with:  Direct pressure  Wound exploration: wound explored through full range of motion and entire depth of wound probed and visualized     Wound extent: areolar tissue violated     Wound extent: no fascia violation noted, no foreign bodies/material noted, no muscle damage noted, no nerve damage noted, no tendon damage noted, no underlying fracture noted and no vascular damage noted     Contaminated: no   Treatment:    Area cleansed with:  Saline   Amount of cleaning:  Extensive   Irrigation solution:  Sterile saline   Irrigation volume:  500cc   Visualized foreign bodies/material removed: no   Skin repair:    Repair method:  Sutures   Suture size:  5-0   Suture material:  Nylon   Suture technique:  Simple interrupted   Number of sutures:  7 Approximation:    Approximation:  Close Post-procedure details:    Dressing:  Sterile dressing     Medications Ordered in ED Medications - No data to display   Initial Impression / Assessment and Plan / ED Course  I have reviewed the triage vital signs and the nursing notes.  Pertinent labs &  imaging results that were available during my care of the patient were reviewed by me and considered in my medical decision making (see chart for details).    Vital signs reviewed and are as follows: Vitals:   04/09/16 2147  BP: 123/78  Pulse: 79  Resp: 18  Temp: 98 F (36.7 C)    11:25 PM Patient counseled on wound care. Patient counseled on need to return or see PCP/urgent care for suture removal in 10 days. Patient was urged to return to the Emergency Department urgently with worsening pain, swelling, expanding erythema especially if it streaks away from the affected area, fever, or if they have any other concerns. Patient verbalized understanding.    Final Clinical Impressions(s) / ED Diagnoses   Final diagnoses:  Arm laceration, right, initial encounter   Tetanus UTD. Laceration occurred < 12 hours prior to repair. Discussed laceration care with pt and answered questions. Pt to f-u for suture removal in 10 days and wound check sooner should there be signs of dehiscence or infection. Pt is hemodynamically stable with no complaints prior to dc.    New Prescriptions New Prescriptions   No medications on file   I personally performed the services described in this documentation, which was scribed in my presence. The recorded information has been reviewed and is accurate.     Tonya Cater, PA-C 04/09/16 [place], MD 04/10/16 0030

## 2016-04-09 NOTE — Discharge Instructions (Signed)
Please read and follow all provided instructions.  Your diagnoses today include:  1. Arm laceration, right, initial encounter     Tests performed today include:  Vital signs. See below for your results today.   Medications prescribed:   None  Take any prescribed medications only as directed.   Home care instructions:  Follow any educational materials and wound care instructions contained in this packet.   Keep affected area above the level of your heart when possible to minimize swelling. Wash area gently twice a day with warm soapy water. Do not apply alcohol or hydrogen peroxide. Cover the area if it draining or weeping.   Follow-up instructions: Suture Removal: Return to the Emergency Department or see your primary care care doctor in 10 days for a recheck of your wound and removal of your sutures or staples.    Return instructions:  Return to the Emergency Department if you have:  Fever  Worsening pain  Worsening swelling of the wound  Pus draining from the wound  Redness of the skin that moves away from the wound, especially if it streaks away from the affected area   Any other emergent concerns  Your vital signs today were: BP 123/78 (BP Location: Left Arm)    Pulse 79    Temp 98 F (36.7 C) (Oral)    Resp 18    Ht 4\' 9"  (1.448 m)    Wt 47.2 kg    SpO2 96%    BMI 22.51 kg/m  If your blood pressure (BP) was elevated above 135/85 this visit, please have this repeated by your doctor within one month. --------------

## 2016-04-09 NOTE — ED Triage Notes (Signed)
Pt states that she was walking in her garage and tripped and fell. She denies hitting her head or LOC. Denies blood thinners. Small laceration to R forearm. Bleeding controlled. No other complaints.

## 2016-04-09 NOTE — ED Notes (Signed)
ED Provider at bedside. 

## 2016-04-22 DIAGNOSIS — Z5181 Encounter for therapeutic drug level monitoring: Secondary | ICD-10-CM | POA: Diagnosis not present

## 2016-04-22 DIAGNOSIS — E559 Vitamin D deficiency, unspecified: Secondary | ICD-10-CM | POA: Diagnosis not present

## 2016-05-07 DIAGNOSIS — L821 Other seborrheic keratosis: Secondary | ICD-10-CM | POA: Diagnosis not present

## 2016-05-07 DIAGNOSIS — L570 Actinic keratosis: Secondary | ICD-10-CM | POA: Diagnosis not present

## 2016-05-11 ENCOUNTER — Telehealth: Payer: Self-pay | Admitting: *Deleted

## 2016-05-11 NOTE — Telephone Encounter (Signed)
[place] controlled substance database checked.  Ok to fill medication.  

## 2016-05-11 NOTE — Telephone Encounter (Signed)
Rec'd call pt requesting refill on her Hydrocodone.../lmb 

## 2016-05-12 MED ORDER — HYDROCODONE-ACETAMINOPHEN 10-325 MG PO TABS: 2.0000 | ORAL_TABLET | Freq: Two times a day (BID) | ORAL | 0 refills | Status: DC

## 2016-05-12 NOTE — Telephone Encounter (Signed)
Printed script, MD signed, notified pt rx ready for pick-up.../lmb 

## 2016-05-27 ENCOUNTER — Encounter: Payer: Self-pay | Admitting: Internal Medicine

## 2016-05-27 ENCOUNTER — Ambulatory Visit (INDEPENDENT_AMBULATORY_CARE_PROVIDER_SITE_OTHER): Payer: Medicare Other | Admitting: Internal Medicine

## 2016-05-27 VITALS — BP 110/80 | HR 89 | Temp 97.8°F | Ht <= 58 in | Wt 103.0 lb

## 2016-05-27 DIAGNOSIS — J4530 Mild persistent asthma, uncomplicated: Secondary | ICD-10-CM | POA: Diagnosis not present

## 2016-05-27 DIAGNOSIS — M545 Low back pain: Secondary | ICD-10-CM | POA: Diagnosis not present

## 2016-05-27 DIAGNOSIS — M19049 Primary osteoarthritis, unspecified hand: Secondary | ICD-10-CM | POA: Diagnosis not present

## 2016-05-27 DIAGNOSIS — G8929 Other chronic pain: Secondary | ICD-10-CM | POA: Insufficient documentation

## 2016-05-27 DIAGNOSIS — I10 Essential (primary) hypertension: Secondary | ICD-10-CM

## 2016-05-27 DIAGNOSIS — R52 Pain, unspecified: Secondary | ICD-10-CM

## 2016-05-27 DIAGNOSIS — F329 Major depressive disorder, single episode, unspecified: Secondary | ICD-10-CM | POA: Diagnosis not present

## 2016-05-27 DIAGNOSIS — F32A Depression, unspecified: Secondary | ICD-10-CM

## 2016-05-27 MED ORDER — LEVALBUTEROL TARTRATE 45 MCG/ACT IN AERO: 1.0000 | INHALATION_SPRAY | RESPIRATORY_TRACT | 8 refills | Status: DC | PRN

## 2016-05-27 NOTE — Assessment & Plan Note (Addendum)
Indication for chronic opioid: chronic lower back/neck pain Medication and dose: norco 10-325 - 2 tabs twice daily, gabapentin as needed # pills per month: norco 120/month Last UDS date:   None to date Pain contract signed (Y/N):   Y, 05/27/16 Date narcotic database last reviewed (include red flags): 05/27/16 - last fill 05/12/16, no red flags - not filling monthly - does not always take max prescribed dose   Refill  Not needed.  Continue 2 in am and 1-2 pm

## 2016-05-27 NOTE — Patient Instructions (Addendum)
  Medications reviewed and updated.  No changes recommended at this time.   A referral was ordered for orthopedics  Please followup in 4 months

## 2016-05-27 NOTE — Progress Notes (Signed)
Subjective:    Patient ID: Tonya [name], female    DOB: Mar 09, 1941, 76 y.o.   MRN: [id number]  HPI The patient is here for follow up.  Her hands are painful and have arthritic changes.  Her left third finger has dramatically changed in the past month - it has turned.  Her right thumb is weak and she drops things all the time.  She takes advil and it helps.  She also takes norco for chronic back pain. She has a bunion/arthritis in the right first toe, but her other foot, knees and hips are good.    Chronic neck and back pain:  She has had neck surgery in the past.  She takes hydrocodone for her back and somewhat for her neck pain.  She typically takes 2 tabs twice a day, but has tried to cut back to 2 in the morning and 1 at night.  On occasion she will have lumbar radiculopathy with right leg pain.  She occasionally uses an icy hot on her lower back.  Pain assessment:  Pain intensity: 5/10 - with medications Amount of pain relief with medication: moderate Use of pain medications:  norco 10-325 - 2 in the morning ad 1-2 at night Side effects: ? Makes her sleeping Sleep:  Not good - working with psychiatry - was recently taken off clonazepam, dog also wakes her Mood: good overall Functional/social activities: continues to be active in home/work setting     Depression, anxiety: she follows with psychiatry and they prescribe her medication.  She is taking her cymbalta daily as prescribed. She was just taken off the clonazepam - concern it was causing fatigue.  She denies any side effects from the medication. She feels her depression is well controlled and she is happy with her current dose of medication.   Hypertension: She is taking her medication daily. She is compliant with a low sodium diet.  She denies chest pain, palpitations, edema, shortness of breath (except hills) and regular headaches. She is exercising regularly.    Exercise induced SOB, asthma:  She takes symbicort  2/daily.  She uses xopenex prior to exercise, but has not used it recently.  She tolerates this inhaler well and would like a refill  Medications and allergies reviewed with patient and updated if appropriate.  Patient Active Problem List   Diagnosis Date Noted  . Pain management 05/27/2016  . Fatigue 01/15/2016  . Chronic lower back pain 01/15/2016  . Osteopenia 01/14/2016  . DDD (degenerative disc disease), cervical S/P Decompressive laminectomy and Fusion 11/15/2012  . Asthma 11/15/2012  . Hypothyroidism 11/15/2012  . Depression 11/15/2012  . Constipation 11/15/2012  . Overactive bladder 11/15/2012  . Anxiety 11/15/2012  . Cervical spinal stenosis 10/24/2012  . Fracture of cervical vertebra ([place]) 08/31/2012  . Scoliosis 07/05/2012  . Degeneration of intervertebral disc of lumbosacral region 07/04/2012  . HYPERTENSION, BENIGN 04/21/2007    Current Outpatient Prescriptions on File Prior to Visit  Medication Sig Dispense Refill  . budesonide-formoterol (SYMBICORT) 80-4.5 MCG/ACT inhaler Inhale 2 puffs into the lungs 2 (two) times daily.    . Calcium-Magnesium-Vitamin D [phone] MG-MG-UNIT TABS Take 5 tablets by mouth daily.    . cholecalciferol (VITAMIN D) 1000 UNITS tablet Take 2,000 Units by mouth daily.    . cycloSPORINE (RESTASIS) 0.05 % ophthalmic emulsion Place 1 drop into both eyes 2 (two) times daily.    . DULoxetine (CYMBALTA) 60 MG capsule Take 60 mg by mouth daily.    Marland Kitchen  fish oil-omega-3 fatty acids 1000 MG capsule Take 2 g by mouth daily.    Marland Kitchen gabapentin (NEURONTIN) 300 MG capsule Take 300 mg by mouth daily as needed (pain).     Marland Kitchen glucosamine-chondroitin 500-400 MG tablet Take 1 tablet by mouth 3 (three) times daily.    Marland Kitchen HYDROcodone-acetaminophen (NORCO) 10-325 MG tablet Take 2 tablets by mouth 2 (two) times daily. 120 tablet 0  . multivitamin-lutein (OCUVITE-LUTEIN) CAPS capsule Take 1 capsule by mouth daily.    Marland Kitchen SYNTHROID 75 MCG tablet Take 1 tablet (75 mcg  total) by mouth daily before breakfast.    . telmisartan-hydrochlorothiazide (MICARDIS HCT) 40-12.5 MG tablet Take 1 tablet by mouth daily. 30 tablet 5  . VESICARE 10 MG tablet Take 10 mg by mouth daily.  6  . vitamin C (ASCORBIC ACID) 500 MG tablet Take 500 mg by mouth daily.     No current facility-administered medications on file prior to visit.     Past Medical History:  Diagnosis Date  . Anemia   . Asthma   . Back pain, chronic   . Diverticulosis   . H/O arthrodesis 11/22/2012  . History of hysterectomy   . Hypertension   . Spinal stenosis   . Synovial cyst     Past Surgical History:  Procedure Laterality Date  . ABDOMINAL HYSTERECTOMY    . BACK SURGERY    . CYST REMOVAL TRUNK      Social History   Social History  . Marital status: Widowed    Spouse name: N/A  . Number of children: N/A  . Years of education: N/A   Social History Main Topics  . Smoking status: Never Smoker  . Smokeless tobacco: Never Used  . Alcohol use Yes     Comment: socially  . Drug use: No  . Sexual activity: Not Asked   Other Topics Concern  . None   Social History Narrative  . None    Family History  Problem Relation Age of Onset  . Other Father     carotid artery disease    Review of Systems  Constitutional: Negative for chills and fever.  Respiratory: Positive for shortness of breath (going up hills). Negative for cough and wheezing.   Cardiovascular: Negative for chest pain, palpitations and leg swelling.  Musculoskeletal: Positive for arthralgias, back pain, joint swelling (hands) and neck pain.  Skin: Negative for color change and rash.  Neurological: Positive for light-headedness (very rare). Negative for headaches.       Objective:   Vitals:   05/27/16 0858  BP: 110/80  Pulse: 89  Temp: 97.8 F (36.6 C)   Wt Readings from Last 3 Encounters:  05/27/16 103 lb (46.7 kg)  04/09/16 104 lb (47.2 kg)  01/14/16 103 lb (46.7 kg)   Body mass index is 22.29  kg/m.   Physical Exam    Constitutional: Appears well-developed and well-nourished. No distress.  HENT:  Head: Normocephalic and atraumatic.  Neck: Neck supple. No tracheal deviation present. No thyromegaly present.  No cervical lymphadenopathy Cardiovascular: Normal rate, regular rhythm and normal heart sounds.   No murmur heard. No carotid bruit .  No edema Pulmonary/Chest: Effort normal and breath sounds normal. No respiratory distress. No has no wheezes. No rales.  Msk: arthritic appearing hands with nodules in PIP and DIP joints, atrophy of thenar muscles R > L Skin: Skin is warm and dry. Not diaphoretic.  Psychiatric: Normal mood and affect. Behavior is normal.  Assessment & Plan:    See Problem List for Assessment and Plan of chronic medical problems.    FU in 4 months

## 2016-05-27 NOTE — Assessment & Plan Note (Signed)
Controlled with current pain regimen She is active and able to do her daily activities/ yard work Continue current dose of pain medication

## 2016-05-27 NOTE — Assessment & Plan Note (Signed)
Controlled Managed by psychiatry

## 2016-05-27 NOTE — Assessment & Plan Note (Addendum)
symbcort twice daily xopenex prior to exercise for SOB and during allergy season No current symptoms Well controlled xopenex refilled

## 2016-05-27 NOTE — Progress Notes (Signed)
Pre visit review using our clinic review tool, if applicable. No additional management support is needed unless otherwise documented below in the visit note. 

## 2016-05-27 NOTE — Assessment & Plan Note (Signed)
BP well controlled Current regimen effective and well tolerated Continue current medications at current doses  

## 2016-06-10 ENCOUNTER — Other Ambulatory Visit: Payer: Self-pay | Admitting: Internal Medicine

## 2016-06-19 ENCOUNTER — Telehealth: Payer: Self-pay | Admitting: Internal Medicine

## 2016-06-19 NOTE — Telephone Encounter (Signed)
[place] controlled substance database checked.  Ok to fill medication.  

## 2016-06-19 NOTE — Telephone Encounter (Signed)
Pt called requesting a refill on HYDROcodone-acetaminophen (NORCO) 10-325 MG tablet. Please advise.

## 2016-06-19 NOTE — Telephone Encounter (Signed)
Please advise if okay to refill. 

## 2016-06-22 ENCOUNTER — Other Ambulatory Visit: Payer: Self-pay | Admitting: Emergency Medicine

## 2016-06-22 MED ORDER — HYDROCODONE-ACETAMINOPHEN 10-325 MG PO TABS: 2.0000 | ORAL_TABLET | Freq: Two times a day (BID) | ORAL | 0 refills | Status: DC

## 2016-06-22 NOTE — Telephone Encounter (Signed)
RX as been picked up by pt.

## 2016-06-22 NOTE — Progress Notes (Unsigned)
RX printed and ready for pt pick-up.

## 2016-06-22 NOTE — Telephone Encounter (Signed)
Patient states to call cell number when ready for pick up at [phone].

## 2016-06-23 ENCOUNTER — Other Ambulatory Visit: Payer: Self-pay | Admitting: Internal Medicine

## 2016-07-21 ENCOUNTER — Ambulatory Visit: Payer: Medicare Other | Admitting: Internal Medicine

## 2016-07-30 ENCOUNTER — Telehealth: Payer: Self-pay | Admitting: Internal Medicine

## 2016-07-30 NOTE — Telephone Encounter (Signed)
[place] Controlled Substance Database checked. Last refilled on 07/23/16. Please advise.

## 2016-07-30 NOTE — Telephone Encounter (Signed)
Pt would like a refill of HYDROcodone-acetaminophen (NORCO) 10-325 MG tablet

## 2016-07-30 NOTE — Telephone Encounter (Signed)
Ok to fill 

## 2016-07-31 MED ORDER — HYDROCODONE-ACETAMINOPHEN 10-325 MG PO TABS: 2.0000 | ORAL_TABLET | Freq: Two times a day (BID) | ORAL | 0 refills | Status: DC

## 2016-07-31 NOTE — Telephone Encounter (Signed)
RX printed. Pt aware its ready for pick-up at front office.

## 2016-08-04 DIAGNOSIS — K5904 Chronic idiopathic constipation: Secondary | ICD-10-CM | POA: Diagnosis not present

## 2016-08-04 DIAGNOSIS — Z1211 Encounter for screening for malignant neoplasm of colon: Secondary | ICD-10-CM | POA: Diagnosis not present

## 2016-08-04 DIAGNOSIS — H15102 Unspecified episcleritis, left eye: Secondary | ICD-10-CM | POA: Diagnosis not present

## 2016-08-04 DIAGNOSIS — K5730 Diverticulosis of large intestine without perforation or abscess without bleeding: Secondary | ICD-10-CM | POA: Diagnosis not present

## 2016-08-04 DIAGNOSIS — K219 Gastro-esophageal reflux disease without esophagitis: Secondary | ICD-10-CM | POA: Diagnosis not present

## 2016-08-04 DIAGNOSIS — K625 Hemorrhage of anus and rectum: Secondary | ICD-10-CM | POA: Diagnosis not present

## 2016-08-10 DIAGNOSIS — H15102 Unspecified episcleritis, left eye: Secondary | ICD-10-CM | POA: Diagnosis not present

## 2016-08-25 DIAGNOSIS — H01004 Unspecified blepharitis left upper eyelid: Secondary | ICD-10-CM | POA: Diagnosis not present

## 2016-08-25 DIAGNOSIS — H04212 Epiphora due to excess lacrimation, left lacrimal gland: Secondary | ICD-10-CM | POA: Diagnosis not present

## 2016-08-25 DIAGNOSIS — H01005 Unspecified blepharitis left lower eyelid: Secondary | ICD-10-CM | POA: Diagnosis not present

## 2016-08-25 DIAGNOSIS — H15102 Unspecified episcleritis, left eye: Secondary | ICD-10-CM | POA: Diagnosis not present

## 2016-08-26 DIAGNOSIS — D2272 Melanocytic nevi of left lower limb, including hip: Secondary | ICD-10-CM | POA: Diagnosis not present

## 2016-08-26 DIAGNOSIS — Z808 Family history of malignant neoplasm of other organs or systems: Secondary | ICD-10-CM | POA: Diagnosis not present

## 2016-08-26 DIAGNOSIS — D485 Neoplasm of uncertain behavior of skin: Secondary | ICD-10-CM | POA: Diagnosis not present

## 2016-08-26 DIAGNOSIS — L820 Inflamed seborrheic keratosis: Secondary | ICD-10-CM | POA: Diagnosis not present

## 2016-08-26 DIAGNOSIS — L570 Actinic keratosis: Secondary | ICD-10-CM | POA: Diagnosis not present

## 2016-09-06 ENCOUNTER — Other Ambulatory Visit: Payer: Self-pay | Admitting: Internal Medicine

## 2016-09-11 ENCOUNTER — Telehealth: Payer: Self-pay | Admitting: *Deleted

## 2016-09-11 DIAGNOSIS — M5440 Lumbago with sciatica, unspecified side: Principal | ICD-10-CM

## 2016-09-11 DIAGNOSIS — G8929 Other chronic pain: Secondary | ICD-10-CM

## 2016-09-11 MED ORDER — HYDROCODONE-ACETAMINOPHEN 10-325 MG PO TABS: 2.0000 | ORAL_TABLET | Freq: Two times a day (BID) | ORAL | 0 refills | Status: DC

## 2016-09-11 NOTE — Telephone Encounter (Signed)
Printed script, MD signed, notified pt rx ready for pick-up.../lmb 

## 2016-09-11 NOTE — Telephone Encounter (Signed)
[place] controlled substance database checked.  Ok to fill medication.  

## 2016-09-11 NOTE — Telephone Encounter (Signed)
Rec'd call pt requesting refill on her Hydrocodone.../lmb 

## 2016-09-16 DIAGNOSIS — Z1211 Encounter for screening for malignant neoplasm of colon: Secondary | ICD-10-CM | POA: Diagnosis not present

## 2016-09-16 DIAGNOSIS — K5730 Diverticulosis of large intestine without perforation or abscess without bleeding: Secondary | ICD-10-CM | POA: Diagnosis not present

## 2016-09-16 LAB — HM COLONOSCOPY

## 2016-09-21 DIAGNOSIS — R7303 Prediabetes: Secondary | ICD-10-CM | POA: Insufficient documentation

## 2016-09-21 NOTE — Assessment & Plan Note (Signed)
Check a1c 

## 2016-09-21 NOTE — Patient Instructions (Addendum)
Test(s) ordered today. Your results will be released to MyChart (or called to you) after review, usually within 72hours after test completion. If any changes need to be made, you will be notified at that same time.  All other Health Maintenance issues reviewed.   All recommended immunizations and age-appropriate screenings are up-to-date or discussed.  No immunizations administered today.   Medications reviewed and updated.  No changes recommended at this time.  Your prescription(s) have been submitted to your pharmacy. Please take as directed and contact our office if you believe you are having problem(s) with the medication(s).   Please followup in 3 months   Health Maintenance, Female Adopting a healthy lifestyle and getting preventive care can go a long way to promote health and wellness. Talk with your health care provider about what schedule of regular examinations is right for you. This is a good chance for you to check in with your provider about disease prevention and staying healthy. In between checkups, there are plenty of things you can do on your own. Experts have done a lot of research about which lifestyle changes and preventive measures are most likely to keep you healthy. Ask your health care provider for more information. Weight and diet Eat a healthy diet  Be sure to include plenty of vegetables, fruits, low-fat dairy products, and lean protein.  Do not eat a lot of foods high in solid fats, added sugars, or salt.  Get regular exercise. This is one of the most important things you can do for your health. ? Most adults should exercise for at least 150 minutes each week. The exercise should increase your heart rate and make you sweat (moderate-intensity exercise). ? Most adults should also do strengthening exercises at least twice a week. This is in addition to the moderate-intensity exercise.  Maintain a healthy weight  Body mass index (BMI) is a measurement that can  be used to identify possible weight problems. It estimates body fat based on height and weight. Your health care provider can help determine your BMI and help you achieve or maintain a healthy weight.  For females 63 years of age and older: ? A BMI below 18.5 is considered underweight. ? A BMI of 18.5 to 24.9 is normal. ? A BMI of 25 to 29.9 is considered overweight. ? A BMI of 30 and above is considered obese.  Watch levels of cholesterol and blood lipids  You should start having your blood tested for lipids and cholesterol at 75 years of age, then have this test every 5 years.  You may need to have your cholesterol levels checked more often if: ? Your lipid or cholesterol levels are high. ? You are older than 75 years of age. ? You are at high risk for heart disease.  Cancer screening Lung Cancer  Lung cancer screening is recommended for adults 65-2 years old who are at high risk for lung cancer because of a history of smoking.  A yearly low-dose CT scan of the lungs is recommended for people who: ? Currently smoke. ? Have quit within the past 15 years. ? Have at least a 30-pack-year history of smoking. A pack year is smoking an average of one pack of cigarettes a day for 1 year.  Yearly screening should continue until it has been 15 years since you quit.  Yearly screening should stop if you develop a health problem that would prevent you from having lung cancer treatment.  Breast Cancer  Practice breast self-awareness.  This means understanding how your breasts normally appear and feel.  It also means doing regular breast self-exams. Let your health care provider know about any changes, no matter how small.  If you are in your 20s or 30s, you should have a clinical breast exam (CBE) by a health care provider every 1-3 years as part of a regular health exam.  If you are 29 or older, have a CBE every year. Also consider having a breast X-ray (mammogram) every year.  If you  have a family history of breast cancer, talk to your health care provider about genetic screening.  If you are at high risk for breast cancer, talk to your health care provider about having an MRI and a mammogram every year.  Breast cancer gene (BRCA) assessment is recommended for women who have family members with BRCA-related cancers. BRCA-related cancers include: ? Breast. ? Ovarian. ? Tubal. ? Peritoneal cancers.  Results of the assessment will determine the need for genetic counseling and BRCA1 and BRCA2 testing.  Cervical Cancer Your health care provider may recommend that you be screened regularly for cancer of the pelvic organs (ovaries, uterus, and vagina). This screening involves a pelvic examination, including checking for microscopic changes to the surface of your cervix (Pap test). You may be encouraged to have this screening done every 3 years, beginning at age 40.  For women ages 44-65, health care providers may recommend pelvic exams and Pap testing every 3 years, or they may recommend the Pap and pelvic exam, combined with testing for human papilloma virus (HPV), every 5 years. Some types of HPV increase your risk of cervical cancer. Testing for HPV may also be done on women of any age with unclear Pap test results.  Other health care providers may not recommend any screening for nonpregnant women who are considered low risk for pelvic cancer and who do not have symptoms. Ask your health care provider if a screening pelvic exam is right for you.  If you have had past treatment for cervical cancer or a condition that could lead to cancer, you need Pap tests and screening for cancer for at least 20 years after your treatment. If Pap tests have been discontinued, your risk factors (such as having a new sexual partner) need to be reassessed to determine if screening should resume. Some women have medical problems that increase the chance of getting cervical cancer. In these cases,  your health care provider may recommend more frequent screening and Pap tests.  Colorectal Cancer  This type of cancer can be detected and often prevented.  Routine colorectal cancer screening usually begins at 75 years of age and continues through 75 years of age.  Your health care provider may recommend screening at an earlier age if you have risk factors for colon cancer.  Your health care provider may also recommend using home test kits to check for hidden blood in the stool.  A small camera at the end of a tube can be used to examine your colon directly (sigmoidoscopy or colonoscopy). This is done to check for the earliest forms of colorectal cancer.  Routine screening usually begins at age 96.  Direct examination of the colon should be repeated every 5-10 years through 75 years of age. However, you may need to be screened more often if early forms of precancerous polyps or small growths are found.  Skin Cancer  Check your skin from head to toe regularly.  Tell your health care provider about any  new moles or changes in moles, especially if there is a change in a mole's shape or color.  Also tell your health care provider if you have a mole that is larger than the size of a pencil eraser.  Always use sunscreen. Apply sunscreen liberally and repeatedly throughout the day.  Protect yourself by wearing long sleeves, pants, a wide-brimmed hat, and sunglasses whenever you are outside.  Heart disease, diabetes, and high blood pressure  High blood pressure causes heart disease and increases the risk of stroke. High blood pressure is more likely to develop in: ? People who have blood pressure in the high end of the normal range (130-139/85-89 mm Hg). ? People who are overweight or obese. ? People who are African American.  If you are 58-1 years of age, have your blood pressure checked every 3-5 years. If you are 52 years of age or older, have your blood pressure checked every year.  You should have your blood pressure measured twice-once when you are at a hospital or clinic, and once when you are not at a hospital or clinic. Record the average of the two measurements. To check your blood pressure when you are not at a hospital or clinic, you can use: ? An automated blood pressure machine at a pharmacy. ? A home blood pressure monitor.  If you are between 24 years and 65 years old, ask your health care provider if you should take aspirin to prevent strokes.  Have regular diabetes screenings. This involves taking a blood sample to check your fasting blood sugar level. ? If you are at a normal weight and have a low risk for diabetes, have this test once every three years after 76 years of age. ? If you are overweight and have a high risk for diabetes, consider being tested at a younger age or more often. Preventing infection Hepatitis B  If you have a higher risk for hepatitis B, you should be screened for this virus. You are considered at high risk for hepatitis B if: ? You were born in a country where hepatitis B is common. Ask your health care provider which countries are considered high risk. ? Your parents were born in a high-risk country, and you have not been immunized against hepatitis B (hepatitis B vaccine). ? You have HIV or AIDS. ? You use needles to inject street drugs. ? You live with someone who has hepatitis B. ? You have had sex with someone who has hepatitis B. ? You get hemodialysis treatment. ? You take certain medicines for conditions, including cancer, organ transplantation, and autoimmune conditions.  Hepatitis C  Blood testing is recommended for: ? Everyone born from [birth year] through 1964. ? Anyone with known risk factors for hepatitis C.  Sexually transmitted infections (STIs)  You should be screened for sexually transmitted infections (STIs) including gonorrhea and chlamydia if: ? You are sexually active and are younger than 75 years of  age. ? You are older than 75 years of age and your health care provider tells you that you are at risk for this type of infection. ? Your sexual activity has changed since you were last screened and you are at an increased risk for chlamydia or gonorrhea. Ask your health care provider if you are at risk.  If you do not have HIV, but are at risk, it may be recommended that you take a prescription medicine daily to prevent HIV infection. This is called pre-exposure prophylaxis (PrEP). You are considered at  risk if: ? You are sexually active and do not regularly use condoms or know the HIV status of your partner(s). ? You take drugs by injection. ? You are sexually active with a partner who has HIV.  Talk with your health care provider about whether you are at high risk of being infected with HIV. If you choose to begin PrEP, you should first be tested for HIV. You should then be tested every 3 months for as long as you are taking PrEP. Pregnancy  If you are premenopausal and you may become pregnant, ask your health care provider about preconception counseling.  If you may become pregnant, take 400 to 800 micrograms (mcg) of folic acid every day.  If you want to prevent pregnancy, talk to your health care provider about birth control (contraception). Osteoporosis and menopause  Osteoporosis is a disease in which the bones lose minerals and strength with aging. This can result in serious bone fractures. Your risk for osteoporosis can be identified using a bone density scan.  If you are 6 years of age or older, or if you are at risk for osteoporosis and fractures, ask your health care provider if you should be screened.  Ask your health care provider whether you should take a calcium or vitamin D supplement to lower your risk for osteoporosis.  Menopause may have certain physical symptoms and risks.  Hormone replacement therapy may reduce some of these symptoms and risks. Talk to your health  care provider about whether hormone replacement therapy is right for you. Follow these instructions at home:  Schedule regular health, dental, and eye exams.  Stay current with your immunizations.  Do not use any tobacco products including cigarettes, chewing tobacco, or electronic cigarettes.  If you are pregnant, do not drink alcohol.  If you are breastfeeding, limit how much and how often you drink alcohol.  Limit alcohol intake to no more than 1 drink per day for nonpregnant women. One drink equals 12 ounces of beer, 5 ounces of wine, or 1 ounces of hard liquor.  Do not use street drugs.  Do not share needles.  Ask your health care provider for help if you need support or information about quitting drugs.  Tell your health care provider if you often feel depressed.  Tell your health care provider if you have ever been abused or do not feel safe at home. This information is not intended to replace advice given to you by your health care provider. Make sure you discuss any questions you have with your health care provider. Document Released: 09/08/2010 Document Revised: 08/01/2015 Document Reviewed: 11/27/2014 Elsevier Interactive Patient Education  [employer].

## 2016-09-21 NOTE — Progress Notes (Signed)
Subjective:    Patient ID: Tonya [name], female    DOB: [birth year], 76 y.o.   MRN: [id number]  HPI She is here for a physical exam.   She denies changes in her health and has no concerns.  She is not exercising like she should  She was advised to ask about lyme disease by a friend.  She does have some fatigue and has had multiple tick bites in the past three years.   Medications and allergies reviewed with patient and updated if appropriate.  Patient Active Problem List   Diagnosis Date Noted  . Hyperglycemia 09/21/2016  . Pain management 05/27/2016  . Fatigue 01/15/2016  . Chronic lower back pain 01/15/2016  . Osteopenia 01/14/2016  . DDD (degenerative disc disease), cervical S/P Decompressive laminectomy and Fusion 11/15/2012  . Asthma 11/15/2012  . Hypothyroidism 11/15/2012  . Depression 11/15/2012  . Constipation 11/15/2012  . Overactive bladder 11/15/2012  . Anxiety 11/15/2012  . Cervical spinal stenosis 10/24/2012  . Fracture of cervical vertebra ([place]) 08/31/2012  . Scoliosis 07/05/2012  . Degeneration of intervertebral disc of lumbosacral region 07/04/2012  . HYPERTENSION, BENIGN 04/21/2007    Current Outpatient Prescriptions on File Prior to Visit  Medication Sig Dispense Refill  . budesonide-formoterol (SYMBICORT) 80-4.5 MCG/ACT inhaler Inhale 2 puffs into the lungs 2 (two) times daily.    . Calcium-Magnesium-Vitamin D [id number] MG-MG-UNIT TABS Take 5 tablets by mouth daily.    . cholecalciferol (VITAMIN D) 1000 UNITS tablet Take 2,000 Units by mouth daily.    . cycloSPORINE (RESTASIS) 0.05 % ophthalmic emulsion Place 1 drop into both eyes 2 (two) times daily.    . DULoxetine (CYMBALTA) 60 MG capsule Take 60 mg by mouth daily.    . fish oil-omega-3 fatty acids 1000 MG capsule Take 2 g by mouth daily.    Marland Kitchen gabapentin (NEURONTIN) 300 MG capsule Take 300 mg by mouth daily as needed (pain).     Marland Kitchen glucosamine-chondroitin 500-400 MG tablet Take 1 tablet by  mouth 3 (three) times daily.    Marland Kitchen HYDROcodone-acetaminophen (NORCO) 10-325 MG tablet Take 2 tablets by mouth 2 (two) times daily. 120 tablet 0  . levalbuterol (XOPENEX HFA) 45 MCG/ACT inhaler Inhale 1-2 puffs into the lungs every 4 (four) hours as needed for wheezing. 1 Inhaler 8  . multivitamin-lutein (OCUVITE-LUTEIN) CAPS capsule Take 1 capsule by mouth daily.    Marland Kitchen SYNTHROID 75 MCG tablet TAKE 1 TABLET BY MOUTH ONCE DAILY 30 tablet 2  . telmisartan-hydrochlorothiazide (MICARDIS HCT) 40-12.5 MG tablet TAKE 1 TABLET BY MOUTH DAILY. 30 tablet 5  . VESICARE 10 MG tablet Take 10 mg by mouth daily.  6  . vitamin C (ASCORBIC ACID) 500 MG tablet Take 500 mg by mouth daily.     No current facility-administered medications on file prior to visit.     Past Medical History:  Diagnosis Date  . Anemia   . Asthma   . Back pain, chronic   . Diverticulosis   . H/O arthrodesis 11/22/2012  . History of hysterectomy   . Hypertension   . Spinal stenosis   . Synovial cyst     Past Surgical History:  Procedure Laterality Date  . ABDOMINAL HYSTERECTOMY    . BACK SURGERY    . CYST REMOVAL TRUNK      Social History   Social History  . Marital status: Widowed    Spouse name: N/A  . Number of children: N/A  . Years of education: N/A  Social History Main Topics  . Smoking status: Never Smoker  . Smokeless tobacco: Never Used  . Alcohol use Yes     Comment: socially  . Drug use: No  . Sexual activity: Not Asked   Other Topics Concern  . None   Social History Narrative   Not currently exercising    Family History  Problem Relation Age of Onset  . Other Father        carotid artery disease  . Heart disease Brother   . Alcohol abuse Brother     Review of Systems  Constitutional: Positive for fatigue (low energy level). Negative for chills and fever.       Hot flashes at times  Eyes: Negative for visual disturbance.  Respiratory: Positive for shortness of breath (only if walking up  hills). Negative for cough and wheezing.   Cardiovascular: Positive for leg swelling (rare). Negative for chest pain and palpitations.  Gastrointestinal: Negative for abdominal pain, blood in stool, constipation, diarrhea and nausea.       No gerd  Genitourinary: Negative for dysuria and hematuria.  Musculoskeletal: Positive for arthralgias (arthritis). Negative for joint swelling and myalgias.  Skin: Negative for rash.  Neurological: Negative for light-headedness and headaches.  Psychiatric/Behavioral: Negative for dysphoric mood. The patient is not nervous/anxious.        Objective:   Vitals:   09/22/16 1423  BP: 132/74  Pulse: 86  Resp: 16  Temp: (!) 97.4 F (36.3 C)   Filed Weights   09/22/16 1423  Weight: 104 lb (47.2 kg)   Body mass index is 22.51 kg/m.  Wt Readings from Last 3 Encounters:  09/22/16 104 lb (47.2 kg)  05/27/16 103 lb (46.7 kg)  04/09/16 104 lb (47.2 kg)     Physical Exam Constitutional: She appears well-developed and well-nourished. No distress.  HENT:  Head: Normocephalic and atraumatic.  Right Ear: External ear normal. Normal ear canal and TM Left Ear: External ear normal.  Normal ear canal and TM Mouth/Throat: Oropharynx is clear and moist.  Eyes: Conjunctivae and EOM are normal.  Neck: Neck supple. No tracheal deviation present. No thyromegaly present.  No carotid bruit  Cardiovascular: Normal rate, regular rhythm and normal heart sounds.   No murmur heard.  No edema. Pulmonary/Chest: Effort normal and breath sounds normal. No respiratory distress. She has no wheezes. She has no rales.  Breast: normal exam; b/l breasts without lumps, skin changes, nipple discharge, axillary lymphadenopathy Abdominal: Soft. She exhibits no distension. There is no tenderness.  Lymphadenopathy: She has no cervical adenopathy.  Skin: Skin is warm and dry. She is not diaphoretic.  Psychiatric: She has a normal mood and affect. Her behavior is normal.          Assessment & Plan:   Physical exam: Screening blood work  ordered  Immunizations   Up to date, discussed shingrix Colonoscopy  Up to date  Mammogram  Up to date  Gyn -  No longer seeing gyn Dexa - has osteopenia - last done ? 2012 Eye exams  Up to date  EKG   Last done 2014 Exercise - not exercising - will try to restart Weight  Normal BMI Skin  No concerns Substance abuse  None   See Problem List for Assessment and Plan of chronic medical problems.  FU in 3 months

## 2016-09-22 ENCOUNTER — Encounter: Payer: Self-pay | Admitting: Internal Medicine

## 2016-09-22 ENCOUNTER — Other Ambulatory Visit: Payer: Self-pay | Admitting: Internal Medicine

## 2016-09-22 ENCOUNTER — Other Ambulatory Visit (INDEPENDENT_AMBULATORY_CARE_PROVIDER_SITE_OTHER): Payer: Medicare Other

## 2016-09-22 ENCOUNTER — Ambulatory Visit (INDEPENDENT_AMBULATORY_CARE_PROVIDER_SITE_OTHER): Payer: Medicare Other | Admitting: Internal Medicine

## 2016-09-22 VITALS — BP 132/74 | HR 86 | Temp 97.4°F | Resp 16 | Ht <= 58 in | Wt 104.0 lb

## 2016-09-22 DIAGNOSIS — R739 Hyperglycemia, unspecified: Secondary | ICD-10-CM

## 2016-09-22 DIAGNOSIS — Z0000 Encounter for general adult medical examination without abnormal findings: Secondary | ICD-10-CM | POA: Diagnosis not present

## 2016-09-22 DIAGNOSIS — E039 Hypothyroidism, unspecified: Secondary | ICD-10-CM

## 2016-09-22 DIAGNOSIS — M8580 Other specified disorders of bone density and structure, unspecified site: Secondary | ICD-10-CM | POA: Diagnosis not present

## 2016-09-22 DIAGNOSIS — R52 Pain, unspecified: Secondary | ICD-10-CM | POA: Diagnosis not present

## 2016-09-22 DIAGNOSIS — I10 Essential (primary) hypertension: Secondary | ICD-10-CM | POA: Diagnosis not present

## 2016-09-22 DIAGNOSIS — R5383 Other fatigue: Secondary | ICD-10-CM | POA: Diagnosis not present

## 2016-09-22 DIAGNOSIS — F419 Anxiety disorder, unspecified: Secondary | ICD-10-CM | POA: Diagnosis not present

## 2016-09-22 DIAGNOSIS — J4530 Mild persistent asthma, uncomplicated: Secondary | ICD-10-CM

## 2016-09-22 DIAGNOSIS — M545 Low back pain: Secondary | ICD-10-CM

## 2016-09-22 DIAGNOSIS — Z13820 Encounter for screening for osteoporosis: Secondary | ICD-10-CM

## 2016-09-22 DIAGNOSIS — G8929 Other chronic pain: Secondary | ICD-10-CM

## 2016-09-22 DIAGNOSIS — F3289 Other specified depressive episodes: Secondary | ICD-10-CM | POA: Diagnosis not present

## 2016-09-22 LAB — LIPID PANEL
CHOL/HDL RATIO: 2
Cholesterol: 207 mg/dL — ABNORMAL HIGH (ref 0–200)
HDL: 101.8 mg/dL (ref >39.00)
LDL CALC: 94 mg/dL (ref 0–99)
NonHDL: 105.3
Triglycerides: 57 mg/dL (ref 0.0–149.0)
VLDL: 11.4 mg/dL (ref 0.0–40.0)

## 2016-09-22 LAB — COMPREHENSIVE METABOLIC PANEL
ALT: 20 U/L (ref 0–35)
AST: 33 U/L (ref 0–37)
Albumin: 4.6 g/dL (ref 3.5–5.2)
Alkaline Phosphatase: 69 U/L (ref 39–117)
BUN: 25 mg/dL — AB (ref 6–23)
CHLORIDE: 97 meq/L (ref 96–112)
CO2: 31 mEq/L (ref 19–32)
Calcium: 9.9 mg/dL (ref 8.4–10.5)
Creatinine, Ser: 1.03 mg/dL (ref 0.40–1.20)
GFR: 55.56 mL/min — ABNORMAL LOW (ref >60.00)
GLUCOSE: 104 mg/dL — AB (ref 70–99)
POTASSIUM: 3.6 meq/L (ref 3.5–5.1)
SODIUM: 135 meq/L (ref 135–145)
Total Bilirubin: 0.5 mg/dL (ref 0.2–1.2)
Total Protein: 7.2 g/dL (ref 6.0–8.3)

## 2016-09-22 LAB — CBC WITH DIFFERENTIAL/PLATELET
BASOS PCT: 0.8 % (ref 0.0–3.0)
Basophils Absolute: 0.1 10*3/uL (ref 0.0–0.1)
EOS ABS: 0.1 10*3/uL (ref 0.0–0.7)
EOS PCT: 1.9 % (ref 0.0–5.0)
HCT: 37.6 % (ref 36.0–46.0)
Hemoglobin: 12.6 g/dL (ref 12.0–15.0)
LYMPHS ABS: 1.8 10*3/uL (ref 0.7–4.0)
Lymphocytes Relative: 24.6 % (ref 12.0–46.0)
MCHC: 33.6 g/dL (ref 30.0–36.0)
MCV: 92.2 fl (ref 78.0–100.0)
MONO ABS: 0.6 10*3/uL (ref 0.1–1.0)
Monocytes Relative: 8.6 % (ref 3.0–12.0)
NEUTROS PCT: 64.1 % (ref 43.0–77.0)
Neutro Abs: 4.8 10*3/uL (ref 1.4–7.7)
Platelets: 242 10*3/uL (ref 150.0–400.0)
RBC: 4.08 Mil/uL (ref 3.87–5.11)
RDW: 13.7 % (ref 11.5–15.5)
WBC: 7.4 10*3/uL (ref 4.0–10.5)

## 2016-09-22 LAB — TSH: TSH: 0.41 u[IU]/mL (ref 0.35–4.50)

## 2016-09-22 LAB — HEMOGLOBIN A1C: Hgb A1c MFr Bld: 6.1 % (ref 4.6–6.5)

## 2016-09-22 NOTE — Assessment & Plan Note (Signed)
Will continue current pain regimen F/u in 3 months for pain management only - pain contract and UDS

## 2016-09-22 NOTE — Assessment & Plan Note (Signed)
Check tsh  Titrate med dose if needed  

## 2016-09-22 NOTE — Assessment & Plan Note (Signed)
Chronic low energy Unlikely lyme , but will check it given multiple tick bites Will check basic labs stressed regular exercise Her sleep is refreshing but does not always sleep well

## 2016-09-22 NOTE — Assessment & Plan Note (Addendum)
Symbicort twice daily xopenex  Prn Controlled, continue above

## 2016-09-22 NOTE — Assessment & Plan Note (Addendum)
Taking vicodin 2 tabs in the morning and 1 at night Pain controlled Will continue  Follow up in 3 months for pain management - will need pain contract signed, UDS

## 2016-09-22 NOTE — Assessment & Plan Note (Signed)
BP Readings from Last 3 Encounters:  09/22/16 132/74  05/27/16 110/80  04/09/16 128/82    BP well controlled Current regimen effective and well tolerated Continue current medications at current doses

## 2016-09-22 NOTE — Assessment & Plan Note (Addendum)
Will get repeat dexa May need to consider treatment Continue vitamin D Stressed regular exercise

## 2016-09-22 NOTE — Assessment & Plan Note (Signed)
Controlled, stable Continue current dose of medication  

## 2016-09-23 LAB — LYME AB/WESTERN BLOT REFLEX: B burgdorferi Ab IgG+IgM: <0.9 Index (ref <0.90)

## 2016-09-25 ENCOUNTER — Encounter: Payer: Self-pay | Admitting: Internal Medicine

## 2016-10-01 ENCOUNTER — Other Ambulatory Visit: Payer: Self-pay | Admitting: Internal Medicine

## 2016-10-01 DIAGNOSIS — Z1231 Encounter for screening mammogram for malignant neoplasm of breast: Secondary | ICD-10-CM

## 2016-10-15 ENCOUNTER — Ambulatory Visit (INDEPENDENT_AMBULATORY_CARE_PROVIDER_SITE_OTHER): Payer: Medicare Other | Admitting: Internal Medicine

## 2016-10-15 ENCOUNTER — Encounter: Payer: Self-pay | Admitting: Internal Medicine

## 2016-10-15 ENCOUNTER — Ambulatory Visit: Payer: Medicare Other | Admitting: Family Medicine

## 2016-10-15 ENCOUNTER — Ambulatory Visit
Admission: RE | Admit: 2016-10-15 | Discharge: 2016-10-15 | Disposition: A | Discharge status: Home / Self Care | Payer: Medicare Other | Source: Ambulatory Visit | Attending: Internal Medicine | Admitting: Internal Medicine

## 2016-10-15 ENCOUNTER — Other Ambulatory Visit (INDEPENDENT_AMBULATORY_CARE_PROVIDER_SITE_OTHER): Payer: Medicare Other

## 2016-10-15 VITALS — BP 116/70 | HR 72 | Ht <= 58 in | Wt 106.0 lb

## 2016-10-15 DIAGNOSIS — Z1231 Encounter for screening mammogram for malignant neoplasm of breast: Secondary | ICD-10-CM

## 2016-10-15 DIAGNOSIS — M545 Low back pain: Secondary | ICD-10-CM | POA: Diagnosis not present

## 2016-10-15 DIAGNOSIS — M8580 Other specified disorders of bone density and structure, unspecified site: Secondary | ICD-10-CM

## 2016-10-15 DIAGNOSIS — R300 Dysuria: Secondary | ICD-10-CM | POA: Diagnosis not present

## 2016-10-15 DIAGNOSIS — G8929 Other chronic pain: Secondary | ICD-10-CM | POA: Diagnosis not present

## 2016-10-15 DIAGNOSIS — I10 Essential (primary) hypertension: Secondary | ICD-10-CM

## 2016-10-15 DIAGNOSIS — N390 Urinary tract infection, site not specified: Secondary | ICD-10-CM

## 2016-10-15 DIAGNOSIS — Z13820 Encounter for screening for osteoporosis: Secondary | ICD-10-CM

## 2016-10-15 DIAGNOSIS — R310 Gross hematuria: Secondary | ICD-10-CM | POA: Diagnosis not present

## 2016-10-15 DIAGNOSIS — M8589 Other specified disorders of bone density and structure, multiple sites: Secondary | ICD-10-CM | POA: Diagnosis not present

## 2016-10-15 DIAGNOSIS — Z780 Asymptomatic menopausal state: Secondary | ICD-10-CM | POA: Diagnosis not present

## 2016-10-15 LAB — URINALYSIS, ROUTINE W REFLEX MICROSCOPIC
Bilirubin Urine: NEGATIVE
HGB URINE DIPSTICK: NEGATIVE
KETONES UR: NEGATIVE
Nitrite: NEGATIVE
SPECIFIC GRAVITY, URINE: 1.01 (ref 1.000–1.030)
TOTAL PROTEIN, URINE-UPE24: NEGATIVE
URINE GLUCOSE: NEGATIVE
UROBILINOGEN UA: 0.2 (ref 0.0–1.0)
pH: 7 (ref 5.0–8.0)

## 2016-10-15 MED ORDER — CEPHALEXIN 500 MG PO CAPS: 500.0000 mg | ORAL_CAPSULE | Freq: Three times a day (TID) | ORAL | 0 refills | Status: AC

## 2016-10-15 NOTE — Progress Notes (Signed)
Subjective:    Patient ID: Tonya [name], female    DOB: 1941/01/04, 76 y.o.   MRN: [id number]  HPI  Here to f/u with c/o onset this am of dysuria and small gross hematuria, without f/c and Denies urinary symptoms such as frequency, urgency, flank pain, or n/v.  Pt denies chest pain, increased sob or doe, wheezing, orthopnea, PND, increased LE swelling, palpitations, dizziness or syncope.   Pt denies polydipsia, polyuria, Last UTI years ago.  Pt continues to have recurring LBP without change in severity, bowel or bladder change, fever, wt loss,  worsening LE pain/numbness/weakness, gait change or falls. Past Medical History:  Diagnosis Date  . Anemia   . Asthma   . Back pain, chronic   . Diverticulosis   . H/O arthrodesis 11/22/2012  . History of hysterectomy   . Hypertension   . Spinal stenosis   . Synovial cyst    Past Surgical History:  Procedure Laterality Date  . ABDOMINAL HYSTERECTOMY    . BACK SURGERY    . CYST REMOVAL TRUNK      reports that she has never smoked. She has never used smokeless tobacco. She reports that she drinks alcohol. She reports that she does not use drugs. family history includes Alcohol abuse in her brother; Heart disease in her brother; Other in her father. Allergies  Allergen Reactions  . Prochlorperazine Edisylate Other (See Comments)    (aka Compazine) causes facial stiffness  . Other Nausea And Vomiting    [place] (shrimp is okay)   Current Outpatient Prescriptions on File Prior to Visit  Medication Sig Dispense Refill  . budesonide-formoterol (SYMBICORT) 80-4.5 MCG/ACT inhaler Inhale 2 puffs into the lungs 2 (two) times daily.    . Calcium-Magnesium-Vitamin D [phone] MG-MG-UNIT TABS Take 5 tablets by mouth daily.    . cholecalciferol (VITAMIN D) 1000 UNITS tablet Take 2,000 Units by mouth daily.    . cycloSPORINE (RESTASIS) 0.05 % ophthalmic emulsion Place 1 drop into both eyes 2 (two) times daily.    . DULoxetine (CYMBALTA) 60 MG  capsule Take 60 mg by mouth daily.    . fish oil-omega-3 fatty acids 1000 MG capsule Take 2 g by mouth daily.    Marland Kitchen gabapentin (NEURONTIN) 300 MG capsule Take 300 mg by mouth daily as needed (pain).     Marland Kitchen glucosamine-chondroitin 500-400 MG tablet Take 1 tablet by mouth 3 (three) times daily.    Marland Kitchen HYDROcodone-acetaminophen (NORCO) 10-325 MG tablet Take 2 tablets by mouth 2 (two) times daily. 120 tablet 0  . levalbuterol (XOPENEX HFA) 45 MCG/ACT inhaler Inhale 1-2 puffs into the lungs every 4 (four) hours as needed for wheezing. 1 Inhaler 8  . multivitamin-lutein (OCUVITE-LUTEIN) CAPS capsule Take 1 capsule by mouth daily.    Marland Kitchen SYNTHROID 75 MCG tablet TAKE 1 TABLET BY MOUTH ONCE DAILY 30 tablet 2  . telmisartan-hydrochlorothiazide (MICARDIS HCT) 40-12.5 MG tablet TAKE 1 TABLET BY MOUTH DAILY. 30 tablet 5  . VESICARE 10 MG tablet Take 10 mg by mouth daily.  6  . vitamin C (ASCORBIC ACID) 500 MG tablet Take 500 mg by mouth daily.     No current facility-administered medications on file prior to visit.    Review of Systems    Constitutional: Negative for other unusual diaphoresis or sweats HENT: Negative for ear discharge or swelling Eyes: Negative for other worsening visual disturbances Respiratory: Negative for stridor or other swelling  Gastrointestinal: Negative for worsening distension or other blood Genitourinary: Negative for  retention or other urinary change Musculoskeletal: Negative for other MSK pain or swelling Skin: Negative for color change or other new lesions Neurological: Negative for worsening tremors and other numbness  Psychiatric/Behavioral: Negative for worsening agitation or other fatigue All other system neg per pt Objective:   Physical Exam BP 116/70   Pulse 72   Ht 4\' 9"  (1.448 m)   Wt 106 lb (48.1 kg)   SpO2 98%   BMI 22.94 kg/m  VS noted, mild ill Constitutional: Pt appears in NAD HENT: Head: NCAT.  Right Ear: External ear normal.  Left Ear: External ear  normal.  Eyes: . Pupils are equal, round, and reactive to light. Conjunctivae and EOM are normal Nose: without d/c or deformity Neck: Neck supple. Gross normal ROM Cardiovascular: Normal rate and regular rhythm.   Pulmonary/Chest: Effort normal and breath sounds without rales or wheezing.  Abd:  Soft, ND, + BS, no organomegaly, with mild tender to low mid abd without guarding or rebound, no flank tender Spine: lowest lumbar tender midline, without swelling or rash Neurological: Pt is alert. At baseline orientation, motor grossly intact Skin: Skin is warm. No rashes, other new lesions, no LE edema Psychiatric: Pt behavior is normal without agitation  No other exam findings    Assessment & Plan:

## 2016-10-15 NOTE — Patient Instructions (Signed)
Please take all new medication as prescribed - the antibiotic  The specimen results and the culture should be on Mychart soon (in the next 2-3 days)  Please continue all other medications as before, and refills have been done if requested.  Please have the pharmacy call with any other refills you may need.  Please keep your appointments with your specialists as you may have planned

## 2016-10-16 LAB — URINE CULTURE: Organism ID, Bacteria: NO GROWTH

## 2016-10-17 ENCOUNTER — Encounter: Payer: Self-pay | Admitting: Internal Medicine

## 2016-10-18 ENCOUNTER — Telehealth: Payer: Self-pay | Admitting: Internal Medicine

## 2016-10-18 DIAGNOSIS — R310 Gross hematuria: Secondary | ICD-10-CM

## 2016-10-18 NOTE — Telephone Encounter (Signed)
Ok for shirron to contact pt  As urine cx was negative, we will need to refer to Urology for the blood in the urine

## 2016-10-18 NOTE — Assessment & Plan Note (Signed)
Likely UTI - for urine studies, empiric oral antbx,  to f/u any worsening symptoms or concerns

## 2016-10-18 NOTE — Assessment & Plan Note (Signed)
stable overall by history and exam, recent data reviewed with pt, and pt to continue medical treatment as before,  to f/u any worsening symptoms or concerns BP Readings from Last 3 Encounters:  10/15/16 116/70  09/22/16 132/74  05/27/16 110/80

## 2016-10-18 NOTE — Assessment & Plan Note (Signed)
Most likely related to above, will need urology referral if cx neg

## 2016-10-18 NOTE — Assessment & Plan Note (Signed)
stable overall by history and exam, and pt to continue medical treatment as before,  to f/u any worsening symptoms or concerns 

## 2016-10-20 NOTE — Telephone Encounter (Signed)
Pt has been informed and expressed understanding.  

## 2016-10-22 ENCOUNTER — Telehealth: Payer: Self-pay | Admitting: Internal Medicine

## 2016-10-22 DIAGNOSIS — G8929 Other chronic pain: Secondary | ICD-10-CM

## 2016-10-22 DIAGNOSIS — M5440 Lumbago with sciatica, unspecified side: Principal | ICD-10-CM

## 2016-10-22 MED ORDER — HYDROCODONE-ACETAMINOPHEN 10-325 MG PO TABS: 2.0000 | ORAL_TABLET | Freq: Two times a day (BID) | ORAL | 0 refills | Status: DC

## 2016-10-22 NOTE — Telephone Encounter (Signed)
Ok to fill 

## 2016-10-22 NOTE — Telephone Encounter (Signed)
Check [place] registry last filled 09/13/2016 @ [facility].../lmb

## 2016-10-22 NOTE — Telephone Encounter (Signed)
Pt called requesting a refill on her HYDROcodone-acetaminophen (NORCO) 10-325 MG tablet. She asked to be called on her cell phone [phone]) when this is ready.

## 2016-10-22 NOTE — Telephone Encounter (Signed)
Printed script will hold until tomorrow since MD has already left for today to sign...Tonya [name]

## 2016-10-23 NOTE — Telephone Encounter (Signed)
MD has signed rx called pt no answer LMOM rx ready for pick-up...[name]

## 2016-11-30 ENCOUNTER — Telehealth: Payer: Self-pay | Admitting: Internal Medicine

## 2016-11-30 DIAGNOSIS — M5440 Lumbago with sciatica, unspecified side: Principal | ICD-10-CM

## 2016-11-30 DIAGNOSIS — G8929 Other chronic pain: Secondary | ICD-10-CM

## 2016-11-30 MED ORDER — HYDROCODONE-ACETAMINOPHEN 10-325 MG PO TABS: 2.0000 | ORAL_TABLET | Freq: Two times a day (BID) | ORAL | 0 refills | Status: DC

## 2016-11-30 MED ORDER — GABAPENTIN 300 MG PO CAPS: 300.0000 mg | ORAL_CAPSULE | Freq: Every day | ORAL | 0 refills | Status: DC | PRN

## 2016-11-30 NOTE — Telephone Encounter (Signed)
Check [place] registry last filled 10/23/2016.Marland Kitchen

## 2016-11-30 NOTE — Telephone Encounter (Signed)
Pt called requesting a refill on her HYDROcodone-acetaminophen (NORCO) 10-325 MG tablet. She also said that she was previously taking Gabapentin 300mg  for sciatica pain. She said that she was walking her dog a couple days ago and thinks that it could have flared up the sciatica pain. She was wanting to know if Dr Quay Burow would prescribe Gabapentin for her.  She can be reached at [phone].

## 2016-11-30 NOTE — Telephone Encounter (Signed)
Notified pt rx ready for pick-up.../lmb 

## 2016-11-30 NOTE — Telephone Encounter (Signed)
Ok to fill both 

## 2016-12-01 DIAGNOSIS — R310 Gross hematuria: Secondary | ICD-10-CM | POA: Diagnosis not present

## 2016-12-04 ENCOUNTER — Other Ambulatory Visit: Payer: Self-pay | Admitting: Internal Medicine

## 2016-12-06 DIAGNOSIS — Z23 Encounter for immunization: Secondary | ICD-10-CM | POA: Diagnosis not present

## 2016-12-08 DIAGNOSIS — R310 Gross hematuria: Secondary | ICD-10-CM | POA: Diagnosis not present

## 2016-12-24 DIAGNOSIS — R310 Gross hematuria: Secondary | ICD-10-CM | POA: Diagnosis not present

## 2016-12-25 ENCOUNTER — Encounter: Payer: Self-pay | Admitting: Internal Medicine

## 2016-12-25 ENCOUNTER — Other Ambulatory Visit: Payer: Medicare Other

## 2016-12-25 ENCOUNTER — Encounter: Payer: Self-pay | Admitting: Emergency Medicine

## 2016-12-25 ENCOUNTER — Ambulatory Visit (INDEPENDENT_AMBULATORY_CARE_PROVIDER_SITE_OTHER): Payer: Medicare Other | Admitting: Internal Medicine

## 2016-12-25 VITALS — BP 156/84 | HR 86 | Temp 98.4°F | Resp 16 | Wt 107.0 lb

## 2016-12-25 DIAGNOSIS — G8929 Other chronic pain: Secondary | ICD-10-CM

## 2016-12-25 DIAGNOSIS — N3281 Overactive bladder: Secondary | ICD-10-CM | POA: Diagnosis not present

## 2016-12-25 DIAGNOSIS — M4802 Spinal stenosis, cervical region: Secondary | ICD-10-CM

## 2016-12-25 DIAGNOSIS — M5440 Lumbago with sciatica, unspecified side: Secondary | ICD-10-CM | POA: Diagnosis not present

## 2016-12-25 MED ORDER — HYDROCODONE-ACETAMINOPHEN 10-325 MG PO TABS: 2.0000 | ORAL_TABLET | Freq: Two times a day (BID) | ORAL | 0 refills | Status: DC

## 2016-12-25 MED ORDER — MIRABEGRON ER 25 MG PO TB24: 25.0000 mg | ORAL_TABLET | Freq: Every day | ORAL | 5 refills | Status: DC

## 2016-12-25 NOTE — Progress Notes (Signed)
Subjective:    Patient ID: Tonya [name], female    DOB: March 05, 1941, 76 y.o.   MRN: [id number]  HPI The patient is here for follow up for chronic pain management.  Indication for chronic opioid: chronic lower back pain with intermittent RLE radiculopathy, chronic neck pain s/p surgery  Medication and dose: Norco 10-325 mg 2 tabs twice daily, gabapentin as needed for RLE pain  # pills per month: 120 Last UDS date: to be done today, 12/25/16 Pain contract signed (Y/N): Y, 12/25/16 Date narcotic database last reviewed (include red flags): 12/25/16 - last filled 11/30/16, due for refill 12/28/16   Pain assessment:  Pain intensity: 5-6/10  With medication Amount of pain relief with medication: 50% reduction of pain Use of pain medications:   Takes it as prescribed, twice daily, sometimes tries to take only 1 tab at times Side effects:  ? Increased sleeping - does not want to get up at night - likely multifactorial Sleep: not always good, due to dog, need for urinating Mood: good overall, denies depression and anxiety Functional/social activities: continues to be active in home/work setting   Last took prescribed pain medication: this morning Alcohol use:    Minimal - 1 drink in past three months Tobacco use:   none Street Drug use:   none  Overactive bladder:  She is taking vesicare daily and is unsure how well it works.  She still has to get up and urinate.    Medications and allergies reviewed with patient and updated if appropriate.  Patient Active Problem List   Diagnosis Date Noted  . Gross hematuria 10/15/2016  . Dysuria 10/15/2016  . Hyperglycemia 09/21/2016  . Encounter for chronic pain management 05/27/2016  . Fatigue 01/15/2016  . Chronic lower back pain 01/15/2016  . Osteopenia, high FRAX 01/14/2016  . DDD (degenerative disc disease), cervical S/P Decompressive laminectomy and Fusion 11/15/2012  . Asthma 11/15/2012  . Hypothyroidism 11/15/2012  . Depression  11/15/2012  . Constipation 11/15/2012  . Overactive bladder 11/15/2012  . Anxiety 11/15/2012  . Cervical spinal stenosis 10/24/2012  . Fracture of cervical vertebra ([place]) 08/31/2012  . Scoliosis 07/05/2012  . Degeneration of intervertebral disc of lumbosacral region 07/04/2012  . HYPERTENSION, BENIGN 04/21/2007    Current Outpatient Prescriptions on File Prior to Visit  Medication Sig Dispense Refill  . budesonide-formoterol (SYMBICORT) 80-4.5 MCG/ACT inhaler Inhale 2 puffs into the lungs 2 (two) times daily.    . Calcium-Magnesium-Vitamin D [phone] MG-MG-UNIT TABS Take 5 tablets by mouth daily.    . cholecalciferol (VITAMIN D) 1000 UNITS tablet Take 2,000 Units by mouth daily.    . cycloSPORINE (RESTASIS) 0.05 % ophthalmic emulsion Place 1 drop into both eyes 2 (two) times daily.    . DULoxetine (CYMBALTA) 60 MG capsule Take 60 mg by mouth daily.    . fish oil-omega-3 fatty acids 1000 MG capsule Take 2 g by mouth daily.    Marland Kitchen gabapentin (NEURONTIN) 300 MG capsule Take 1 capsule (300 mg total) by mouth daily as needed (pain). 30 capsule 0  . glucosamine-chondroitin 500-400 MG tablet Take 1 tablet by mouth 3 (three) times daily.    Marland Kitchen levalbuterol (XOPENEX HFA) 45 MCG/ACT inhaler Inhale 1-2 puffs into the lungs every 4 (four) hours as needed for wheezing. 1 Inhaler 8  . multivitamin-lutein (OCUVITE-LUTEIN) CAPS capsule Take 1 capsule by mouth daily.    Marland Kitchen SYNTHROID 75 MCG tablet TAKE 1 TABLET BY MOUTH ONCE DAILY 30 tablet 2  . telmisartan-hydrochlorothiazide (  MICARDIS HCT) 40-12.5 MG tablet TAKE 1 TABLET BY MOUTH DAILY. 30 tablet 5  . VESICARE 10 MG tablet Take 10 mg by mouth daily.  6  . vitamin C (ASCORBIC ACID) 500 MG tablet Take 500 mg by mouth daily.     No current facility-administered medications on file prior to visit.     Past Medical History:  Diagnosis Date  . Anemia   . Asthma   . Back pain, chronic   . Diverticulosis   . H/O arthrodesis 11/22/2012  . History of  hysterectomy   . Hypertension   . Spinal stenosis   . Synovial cyst     Past Surgical History:  Procedure Laterality Date  . ABDOMINAL HYSTERECTOMY    . BACK SURGERY    . CYST REMOVAL TRUNK      Social History   Social History  . Marital status: Widowed    Spouse name: N/A  . Number of children: N/A  . Years of education: N/A   Social History Main Topics  . Smoking status: Never Smoker  . Smokeless tobacco: Never Used  . Alcohol use Yes     Comment: socially  . Drug use: No  . Sexual activity: Not Asked   Other Topics Concern  . None   Social History Narrative   Not currently exercising    Family History  Problem Relation Age of Onset  . Other Father        carotid artery disease  . Heart disease Brother   . Alcohol abuse Brother   . Breast cancer Neg Hx     Review of Systems  Constitutional: Negative for chills and fever.  Genitourinary: Positive for frequency.  Musculoskeletal: Positive for back pain and neck pain.  Neurological: Negative for weakness, light-headedness, numbness and headaches.  Psychiatric/Behavioral: Positive for sleep disturbance (interrupted sleep for many reasons). Negative for dysphoric mood. The patient is not nervous/anxious.        Objective:   Vitals:   12/25/16 1406  BP: (!) 156/84  Pulse: 86  Resp: 16  Temp: 98.4 F (36.9 C)  SpO2: 94%   Wt Readings from Last 3 Encounters:  12/25/16 107 lb (48.5 kg)  10/15/16 106 lb (48.1 kg)  09/22/16 104 lb (47.2 kg)   Body mass index is 23.15 kg/m.   Physical Exam    Constitutional: Appears well-developed and well-nourished. No distress.  HENT:  Head: Normocephalic and atraumatic.  Cardiovascular: Normal rate, regular rhythm and normal heart sounds.   No murmur heard. No carotid bruit .  No edema Pulmonary/Chest: Effort normal and breath sounds normal. No respiratory distress. No has no wheezes. No rales.  Neuro: normal sensation and strength in b/l UE and LE  Skin:  Skin is warm and dry. Not diaphoretic.  Psychiatric: Normal mood and affect. Behavior is normal.      Assessment & Plan:    See Problem List for Assessment and Plan of chronic medical problems.

## 2016-12-25 NOTE — Assessment & Plan Note (Addendum)
Pain in lower back and toward right lower back , occasional right lower extremity pain [place] controlled substance data base checked - no concerns for aberrant behavior Continue current pain medication

## 2016-12-25 NOTE — Patient Instructions (Signed)
Have a urine test done today.  Test(s) ordered today. Your results will be released to [place] (or called to you) after review, usually within 72hours after test completion. If any changes need to be made, you will be notified at that same time.   Medications reviewed and updated.  Changes include stopping vesicare and trying myretriq for your bladder.  Your prescription(s) have been submitted to your pharmacy. Please take as directed and contact our office if you believe you are having problem(s) with the medication(s).   Please followup in 3 months

## 2016-12-25 NOTE — Assessment & Plan Note (Signed)
[place] controlled substance database checked and there is no evidence of aberrant behavior.   Last filled 11/30/16, due for refill 12/28/16 Prescriptions given today for next 3 months and will follow up in 3 months UDS today, pain contract signed today

## 2016-12-26 NOTE — Assessment & Plan Note (Signed)
vesicare not working well - will d/c Trial of myrbetriq 25 mg daily - can increase if tolerated

## 2016-12-26 NOTE — Assessment & Plan Note (Signed)
Chronic neck pain Pain controlled with current pain regimen - will continue current dose

## 2016-12-28 ENCOUNTER — Other Ambulatory Visit: Payer: Self-pay | Admitting: Emergency Medicine

## 2016-12-28 MED ORDER — TELMISARTAN-HCTZ 40-12.5 MG PO TABS: 1.0000 | ORAL_TABLET | Freq: Every day | ORAL | 1 refills | Status: DC

## 2016-12-30 LAB — PAIN MGMT, PROFILE 8 W/CONF, U
6 Acetylmorphine: NEGATIVE ng/mL (ref <10)
ALCOHOL METABOLITES: NEGATIVE ng/mL (ref <500)
Amphetamines: NEGATIVE ng/mL (ref <500)
BUPRENORPHINE, URINE: NEGATIVE ng/mL (ref <5)
Benzodiazepines: NEGATIVE ng/mL (ref <100)
Cocaine Metabolite: NEGATIVE ng/mL (ref <150)
Codeine: NEGATIVE ng/mL (ref <50)
Creatinine: 206 mg/dL
HYDROCODONE: 4797 ng/mL — AB (ref <50)
Hydromorphone: 451 ng/mL — ABNORMAL HIGH (ref <50)
MDMA: NEGATIVE ng/mL (ref <500)
MORPHINE: NEGATIVE ng/mL (ref <50)
Marijuana Metabolite: NEGATIVE ng/mL (ref <20)
Norhydrocodone: 10797 ng/mL — ABNORMAL HIGH (ref <50)
Opiates: POSITIVE ng/mL — AB (ref <100)
Oxidant: NEGATIVE ug/mL (ref <200)
Oxycodone: NEGATIVE ng/mL (ref <100)
pH: 6.43 (ref 4.5–9.0)

## 2017-02-23 ENCOUNTER — Other Ambulatory Visit: Payer: Self-pay | Admitting: Emergency Medicine

## 2017-02-23 DIAGNOSIS — Z961 Presence of intraocular lens: Secondary | ICD-10-CM | POA: Diagnosis not present

## 2017-02-23 DIAGNOSIS — H35363 Drusen (degenerative) of macula, bilateral: Secondary | ICD-10-CM | POA: Diagnosis not present

## 2017-02-23 MED ORDER — SYNTHROID 75 MCG PO TABS: 75.0000 ug | ORAL_TABLET | Freq: Every day | ORAL | 1 refills | Status: DC

## 2017-03-15 ENCOUNTER — Telehealth: Payer: Self-pay | Admitting: Internal Medicine

## 2017-03-15 NOTE — Telephone Encounter (Signed)
Copied from [place] [phone]. Topic: Quick Communication - See Telephone Encounter >> Mar 15, 2017  2:45 PM Hewitt Shorts wrote: CRM for notification. See Telephone encounter for: pt is needing a refill on her symbocort   Inhaler she states she only has one more use out of it and needs it refilled   Best number  03/15/17.

## 2017-03-15 NOTE — Telephone Encounter (Signed)
Left message to call office back to confirm which pharmacy the pt wanted to use for the refill.

## 2017-03-16 MED ORDER — BUDESONIDE-FORMOTEROL FUMARATE 80-4.5 MCG/ACT IN AERO: 2.0000 | INHALATION_SPRAY | Freq: Two times a day (BID) | RESPIRATORY_TRACT | 5 refills | Status: DC

## 2017-03-16 NOTE — Addendum Note (Signed)
Addended by: Earnstine Regal on: 03/16/2017 12:45 PM   Modules accepted: Orders

## 2017-03-16 NOTE — Telephone Encounter (Signed)
Pls advise if ok to send../lmb 

## 2017-03-16 NOTE — Telephone Encounter (Signed)
Refill request for Symbicort inhaler  / LOV with Dr. Quay Burow on 12/25/16 / Medication is showing as a historical med with no recent refills / Will route to office for approval. / [facility]

## 2017-03-16 NOTE — Telephone Encounter (Signed)
Refill sent to [facility]../lmb 

## 2017-03-16 NOTE — Telephone Encounter (Signed)
Ok to send  with refills  

## 2017-03-28 NOTE — Progress Notes (Signed)
Subjective:    Patient ID: Tonya [name], female    DOB: Jun 08, 1940, 77 y.o.   MRN: [id number]  HPI The patient is here for follow up for chronic pain management.  Indication for chronic opioid: chronic lower back pain with occ RLE pain, chronic neck pain s/p surgery Medication and dose: norco 10-325 mg  - 1-2 tabs 2 times per day - often only takes 1 at night # pills per month: 120 Last UDS date: 12/25/16 Pain contract signed (Y/N): 12/25/16 Date narcotic database last reviewed (include red flags): 03/29/17 -  Last filled 03/23/17  Pain assessment:  Pain intensity: 4/10  Amount of pain relief with medication:  Significant relief of pain  Use of pain medications: uses them apropriately Side effects: none Sleep: good sleep - no difficulty - may sleep too much Mood: screening for depression negative Functional/social activities: continues to be active in home setting - does house and yard work, walks dog  Last took prescribed pain medication: today Alcohol use:   1 drink a month Tobacco use:    None  Street Drug use:   none   Hypertension: She is taking her medication daily. She is compliant with a low sodium diet.  She denies chest pain, palpitations, edema, shortness of breath and regular headaches. She is walking the dog for exercise.     Hypothyroidism:  She is taking her medication daily.  She denies any recent changes in energy or weight that are unexplained.   Prediabetes:  She is compliant with a low sugar/carbohydrate diet.  She is exercising regularly, but states she should get back to the gym.  Depression: She is taking her medication daily as prescribed. She denies any side effects from the medication. She was concerned she was depressed because she was sleeping too much.  She is unsure if her depression is controlled or not.  she often will stay up late and read - this past week she has been going to bed at 3am.       Medications and allergies reviewed with  patient and updated if appropriate.  Patient Active Problem List   Diagnosis Date Noted  . Gross hematuria 10/15/2016  . Dysuria 10/15/2016  . Hyperglycemia 09/21/2016  . Encounter for chronic pain management 05/27/2016  . Fatigue 01/15/2016  . Chronic lower back pain 01/15/2016  . Osteopenia, high FRAX 01/14/2016  . DDD (degenerative disc disease), cervical S/P Decompressive laminectomy and Fusion 11/15/2012  . Asthma 11/15/2012  . Hypothyroidism 11/15/2012  . Depression 11/15/2012  . Constipation 11/15/2012  . Overactive bladder 11/15/2012  . Anxiety 11/15/2012  . Cervical spinal stenosis 10/24/2012  . Fracture of cervical vertebra ([place]) 08/31/2012  . Scoliosis 07/05/2012  . Degeneration of intervertebral disc of lumbosacral region 07/04/2012  . HYPERTENSION, BENIGN 04/21/2007    Current Outpatient Medications on File Prior to Visit  Medication Sig Dispense Refill  . budesonide-formoterol (SYMBICORT) 80-4.5 MCG/ACT inhaler Inhale 2 puffs into the lungs 2 (two) times daily. 1 Inhaler 5  . Calcium-Magnesium-Vitamin D [phone] MG-MG-UNIT TABS Take 2 tablets by mouth daily.     . cholecalciferol (VITAMIN D) 1000 UNITS tablet Take 2,000 Units by mouth daily.    . cycloSPORINE (RESTASIS) 0.05 % ophthalmic emulsion Place 1 drop into both eyes 2 (two) times daily.    . DULoxetine (CYMBALTA) 60 MG capsule Take 60 mg by mouth daily.    . fish oil-omega-3 fatty acids 1000 MG capsule Take 2 g by mouth daily.    Marland Kitchen  gabapentin (NEURONTIN) 300 MG capsule Take 1 capsule (300 mg total) by mouth daily as needed (pain). 30 capsule 0  . glucosamine-chondroitin 500-400 MG tablet Take 2 tablets by mouth 3 (three) times daily.     Marland Kitchen HYDROcodone-acetaminophen (NORCO) 10-325 MG tablet Take 2 tablets by mouth 2 (two) times daily. For chronic lower back and neck pain 120 tablet 0  . levalbuterol (XOPENEX HFA) 45 MCG/ACT inhaler Inhale 1-2 puffs into the lungs every 4 (four) hours as needed for wheezing.  1 Inhaler 8  . mirabegron ER (MYRBETRIQ) 25 MG TB24 tablet Take 1 tablet (25 mg total) by mouth daily. 30 tablet 5  . multivitamin-lutein (OCUVITE-LUTEIN) CAPS capsule Take 1 capsule by mouth daily.    Marland Kitchen SYNTHROID 75 MCG tablet Take 1 tablet (75 mcg total) by mouth daily. 90 tablet 1  . telmisartan-hydrochlorothiazide (MICARDIS HCT) 40-12.5 MG tablet Take 1 tablet by mouth daily. 90 tablet 1  . vitamin C (ASCORBIC ACID) 500 MG tablet Take 500 mg by mouth daily.     No current facility-administered medications on file prior to visit.     Past Medical History:  Diagnosis Date  . Anemia   . Asthma   . Back pain, chronic   . Diverticulosis   . H/O arthrodesis 11/22/2012  . History of hysterectomy   . Hypertension   . Spinal stenosis   . Synovial cyst     Past Surgical History:  Procedure Laterality Date  . ABDOMINAL HYSTERECTOMY    . BACK SURGERY    . CYST REMOVAL TRUNK      Social History   Socioeconomic History  . Marital status: Widowed    Spouse name: None  . Number of children: None  . Years of education: None  . Highest education level: None  Social Needs  . Financial resource strain: None  . Food insecurity - worry: None  . Food insecurity - inability: None  . Transportation needs - medical: None  . Transportation needs - non-medical: None  Occupational History  . None  Tobacco Use  . Smoking status: Never Smoker  . Smokeless tobacco: Never Used  Substance and Sexual Activity  . Alcohol use: Yes    Comment: socially  . Drug use: No  . Sexual activity: None  Other Topics Concern  . None  Social History Narrative   Not currently exercising    Family History  Problem Relation Age of Onset  . Other Father        carotid artery disease  . Heart disease Brother   . Alcohol abuse Brother   . Breast cancer Neg Hx     Review of Systems  Constitutional: Negative for chills and fever.  Respiratory: Negative for cough, shortness of breath and wheezing.     Cardiovascular: Negative for chest pain, palpitations and leg swelling.  Gastrointestinal: Positive for constipation (controlled).  Neurological: Positive for light-headedness (occ). Negative for headaches.       Objective:   Vitals:   03/29/17 1136  BP: 130/84  Pulse: 77  Resp: 16  Temp: 98 F (36.7 C)  SpO2: 98%   Wt Readings from Last 3 Encounters:  03/29/17 111 lb (50.3 kg)  12/25/16 107 lb (48.5 kg)  10/15/16 106 lb (48.1 kg)   Body mass index is 24.02 kg/m.   Physical Exam    Constitutional: Appears well-developed and well-nourished. No distress.  HENT:  Head: Normocephalic and atraumatic.  Neck: Neck supple. No tracheal deviation present. No thyromegaly present.  No cervical lymphadenopathy Cardiovascular: Normal rate, regular rhythm and normal heart sounds.   No murmur heard. No carotid bruit .  No edema Pulmonary/Chest: Effort normal and breath sounds normal. No respiratory distress. No has no wheezes. No rales.  Skin: Skin is warm and dry. Not diaphoretic.  Psychiatric: Normal mood and affect. Behavior is normal.      Assessment & Plan:    See Problem List for Assessment and Plan of chronic medical problems.    FU in 3 months

## 2017-03-28 NOTE — Patient Instructions (Addendum)
  Test(s) ordered today. Your results will be released to MyChart (or called to you) after review, usually within 72hours after test completion. If any changes need to be made, you will be notified at that same time.   Medications reviewed and updated.  No changes recommended at this time.    Please followup in 3 months   

## 2017-03-29 ENCOUNTER — Other Ambulatory Visit (INDEPENDENT_AMBULATORY_CARE_PROVIDER_SITE_OTHER): Payer: BC Managed Care – PPO

## 2017-03-29 ENCOUNTER — Encounter: Payer: Self-pay | Admitting: Internal Medicine

## 2017-03-29 ENCOUNTER — Ambulatory Visit: Payer: BC Managed Care – PPO | Admitting: Internal Medicine

## 2017-03-29 VITALS — BP 130/84 | HR 77 | Temp 98.0°F | Resp 16 | Wt 111.0 lb

## 2017-03-29 DIAGNOSIS — M4802 Spinal stenosis, cervical region: Secondary | ICD-10-CM

## 2017-03-29 DIAGNOSIS — F3289 Other specified depressive episodes: Secondary | ICD-10-CM

## 2017-03-29 DIAGNOSIS — G8929 Other chronic pain: Secondary | ICD-10-CM | POA: Diagnosis not present

## 2017-03-29 DIAGNOSIS — R7303 Prediabetes: Secondary | ICD-10-CM

## 2017-03-29 DIAGNOSIS — M545 Low back pain: Secondary | ICD-10-CM

## 2017-03-29 DIAGNOSIS — E039 Hypothyroidism, unspecified: Secondary | ICD-10-CM

## 2017-03-29 DIAGNOSIS — I10 Essential (primary) hypertension: Secondary | ICD-10-CM

## 2017-03-29 LAB — COMPREHENSIVE METABOLIC PANEL
ALBUMIN: 4.2 g/dL (ref 3.5–5.2)
ALK PHOS: 61 U/L (ref 39–117)
ALT: 14 U/L (ref 0–35)
AST: 18 U/L (ref 0–37)
BUN: 33 mg/dL — AB (ref 6–23)
CALCIUM: 9.7 mg/dL (ref 8.4–10.5)
CHLORIDE: 98 meq/L (ref 96–112)
CO2: 33 mEq/L — ABNORMAL HIGH (ref 19–32)
CREATININE: 1.13 mg/dL (ref 0.40–1.20)
GFR: 49.86 mL/min — ABNORMAL LOW (ref >60.00)
Glucose, Bld: 82 mg/dL (ref 70–99)
Potassium: 4.1 mEq/L (ref 3.5–5.1)
SODIUM: 139 meq/L (ref 135–145)
TOTAL PROTEIN: 6.8 g/dL (ref 6.0–8.3)
Total Bilirubin: 0.5 mg/dL (ref 0.2–1.2)

## 2017-03-29 LAB — HEMOGLOBIN A1C: HEMOGLOBIN A1C: 6.2 % (ref 4.6–6.5)

## 2017-03-29 LAB — TSH: TSH: 0.68 u[IU]/mL (ref 0.35–4.50)

## 2017-03-29 NOTE — Assessment & Plan Note (Signed)
BP well controlled Current regimen effective and well tolerated Continue current medications at current doses cmp  

## 2017-03-29 NOTE — Assessment & Plan Note (Signed)
Chronic back pain Improved pain with current pain regimen - will continue current medication at current doses Follow u pin 3 months

## 2017-03-29 NOTE — Assessment & Plan Note (Signed)
Check a1c Low sugar / carb diet Stressed regular exercise   

## 2017-03-29 NOTE — Assessment & Plan Note (Signed)
Chronic neck pain Improved pain with current pain regimen - will continue current medication at current doses Follow u pin 3 months

## 2017-03-29 NOTE — Assessment & Plan Note (Addendum)
Screened for depression using the PHQ 9 scale.   Depression controlled, stable Discussed sleeping schedule - she has gotten into a bad habit of staying up too late and then sleeping in too late - she will start to go to bed earlier Continue cymbalta at current dose.

## 2017-03-29 NOTE — Assessment & Plan Note (Signed)
Check tsh  Titrate med dose if needed  

## 2017-03-29 NOTE — Assessment & Plan Note (Signed)
chronic lower back pain with occ RLE pain, chronic neck pain s/p surgery taking norco 10-325 mg  - 1-2 tabs 2 times per day - often only takes 1 at night # pills per month: 120 Last UDS date: 12/25/16 Pain contract signed (Y/N): 12/25/16 Date narcotic database last reviewed today,   Last filled 03/23/17, no refill today Pain well controlled, no evidence of aberrant behavior Follow up in 3 months

## 2017-03-31 ENCOUNTER — Encounter: Payer: Self-pay | Admitting: Internal Medicine

## 2017-04-30 ENCOUNTER — Other Ambulatory Visit: Payer: Self-pay | Admitting: Internal Medicine

## 2017-04-30 DIAGNOSIS — M5440 Lumbago with sciatica, unspecified side: Principal | ICD-10-CM

## 2017-04-30 DIAGNOSIS — G8929 Other chronic pain: Secondary | ICD-10-CM

## 2017-04-30 MED ORDER — HYDROCODONE-ACETAMINOPHEN 10-325 MG PO TABS: 2.0000 | ORAL_TABLET | Freq: Two times a day (BID) | ORAL | 0 refills | Status: DC

## 2017-04-30 NOTE — Telephone Encounter (Signed)
Copied from [place] [id number]. Topic: Quick Communication - Rx Refill/Question >> Apr 30, 2017  2:48 PM Percell Belt A wrote: Medication: HYDROcodone-acetaminophen ([place]) 10-325 MG tablet [[id number]   Has the patient contacted their pharmacy? no  (Agent: If no, request that the patient contact the pharmacy for the refill.)   Preferred Pharmacy (with phone number or street name[facility]    Agent: Please be advised that RX refills may take up to 3 business days. We ask that you follow-up with your pharmacy.

## 2017-04-30 NOTE — Telephone Encounter (Signed)
Norco  LOV 03/29/17  Dr. Quay Burow  Rx verified: [facility] [place] [place] - [address]

## 2017-04-30 NOTE — Telephone Encounter (Signed)
[place] Controlled Substance Database checked. Last filled on 03/23/17

## 2017-06-08 ENCOUNTER — Other Ambulatory Visit: Payer: Self-pay | Admitting: Internal Medicine

## 2017-06-08 DIAGNOSIS — G8929 Other chronic pain: Secondary | ICD-10-CM

## 2017-06-08 DIAGNOSIS — M5440 Lumbago with sciatica, unspecified side: Principal | ICD-10-CM

## 2017-06-08 NOTE — Telephone Encounter (Signed)
Last OV: 03/29/17 PCP: Quay Burow Pharmacy: [facility] [place] [place] [phone] (Phone) [phone] (Fax)

## 2017-06-08 NOTE — Telephone Encounter (Signed)
Check [place] registry last filled 04/30/2017.Marland KitchenJohny Chess

## 2017-06-08 NOTE — Telephone Encounter (Signed)
Copied from [place] [id number]. Topic: General - Other >> Jun 08, 2017  2:37 PM Darl Householder, RMA wrote: Reason for CRM: Medication refill request for HYDROcodone-acetaminophen (NORCO) 10-325 MG tablet [facility]

## 2017-06-09 MED ORDER — HYDROCODONE-ACETAMINOPHEN 10-325 MG PO TABS: 2.0000 | ORAL_TABLET | Freq: Two times a day (BID) | ORAL | 0 refills | Status: DC

## 2017-06-16 ENCOUNTER — Telehealth: Payer: Self-pay | Admitting: Internal Medicine

## 2017-06-16 MED ORDER — VESICARE 10 MG PO TABS: 10.0000 mg | ORAL_TABLET | Freq: Every day | ORAL | 5 refills | Status: DC

## 2017-06-16 NOTE — Telephone Encounter (Signed)
Reviewed chart pt takes med  For over active bladder. Pt is up-to-date sent rx to [facility].Marland KitchenJohny Chess

## 2017-06-16 NOTE — Telephone Encounter (Signed)
Copied from [place] [id number]. Topic: Quick Communication - Rx Refill/Question >> Jun 16, 2017 12:37 PM Bea Graff, NT wrote: Medication: VESICARE 10 MG tablet Has the patient contacted their pharmacy? Yes.   (Agent: If no, request that the patient contact the pharmacy for the refill.) Preferred Pharmacy (with phone number or street name): [facility] [place] [place] [facility] [address] [phone] (Phone) [phone] (Fax)  Agent: Please be advised that RX refills may take up to 3 business days. We ask that you follow-up with your pharmacy.

## 2017-06-16 NOTE — Telephone Encounter (Signed)
Request refill for vesicare 10 mg tab Provider  Billey Gosling, MD  Med has historical provider.  [id number]   [address]  Please review

## 2017-06-30 ENCOUNTER — Other Ambulatory Visit: Payer: Self-pay | Admitting: Internal Medicine

## 2017-07-02 NOTE — Patient Instructions (Addendum)
  Medications reviewed and updated.  No changes recommended at this time.  Your prescription(s) have been submitted to your pharmacy. Please take as directed and contact our office if you believe you are having problem(s) with the medication(s).    Please followup in 3 months   

## 2017-07-02 NOTE — Progress Notes (Signed)
Subjective:    Patient ID: Tonya [name], female    DOB: 1941/01/26, 77 y.o.   MRN: [id number]  HPI The patient is here for follow up for chronic pain management.  Indication for chronic opioid: chronic lower back pain with intermittent RLE pain and chronic neck pain s/p surgery Medication and dose: norco 10-325 mg  1-2 tabs BID - often takes 3/day # pills per month: 120 Last UDS date: 12/25/16  Pain contract signed (Y/N): Y, 12/25/16 Date narcotic database last reviewed (include red flags): 07/05/17, no red flags  Pain assessment:  Pain intensity:   7-8  /10  With increased yard work Amount of pain relief with medication:  significant Use of pain medications:  Taking it as prescribed Side effects:   none Sleep:  Needs to increase sleep  - working on going to bed earlier Mood: denies depression and anxiety - decreasing cymbalta Functional/social activities: continues to be active in home/work setting   Last took prescribed pain medication:  Alcohol use:   Rare, 1 drink/month Tobacco use:     none Street Drug use:    none   Hand osteoarthritis: Tonya [name] has significant bilateral arthritis in her hands.  Tonya [name] does have some weakness associated with arthritis.  Tonya [name] is taking hydrocodone for her chronic back and neck pain.  Tonya [name] takes occasional ibuprofen.  Tonya [name] was wondering if Tonya [name] should see anyone for this or if there are any other things that Tonya [name] could do.  Medications and allergies reviewed with patient and updated if appropriate.  Patient Active Problem List   Diagnosis Date Noted  . Gross hematuria 10/15/2016  . Prediabetes 09/21/2016  . Encounter for chronic pain management 05/27/2016  . Fatigue 01/15/2016  . Chronic lower back pain 01/15/2016  . Osteopenia, high FRAX 01/14/2016  . DDD (degenerative disc disease), cervical S/P Decompressive laminectomy and Fusion 11/15/2012  . Asthma 11/15/2012  . Hypothyroidism 11/15/2012  . Depression 11/15/2012  . Constipation  11/15/2012  . Overactive bladder 11/15/2012  . Anxiety 11/15/2012  . Cervical spinal stenosis 10/24/2012  . Fracture of cervical vertebra ([place]) 08/31/2012  . Scoliosis 07/05/2012  . Degeneration of intervertebral disc of lumbosacral region 07/04/2012  . HYPERTENSION, BENIGN 04/21/2007    Current Outpatient Medications on File Prior to Visit  Medication Sig Dispense Refill  . budesonide-formoterol (SYMBICORT) 80-4.5 MCG/ACT inhaler Inhale 2 puffs into the lungs 2 (two) times daily. 1 Inhaler 5  . cycloSPORINE (RESTASIS) 0.05 % ophthalmic emulsion Place 1 drop into both eyes 2 (two) times daily.    . DULoxetine (CYMBALTA) 20 MG capsule Take 20 mg by mouth daily.    . fish oil-omega-3 fatty acids 1000 MG capsule Take 2 g by mouth daily.    Marland Kitchen gabapentin (NEURONTIN) 300 MG capsule Take 1 capsule (300 mg total) by mouth daily as needed (pain). 30 capsule 0  . HYDROcodone-acetaminophen (NORCO) 10-325 MG tablet Take 2 tablets by mouth 2 (two) times daily. For chronic lower back and neck pain 120 tablet 0  . levalbuterol (XOPENEX HFA) 45 MCG/ACT inhaler Inhale 1-2 puffs into the lungs every 4 (four) hours as needed for wheezing. 1 Inhaler 8  . multivitamin-lutein (OCUVITE-LUTEIN) CAPS capsule Take 1 capsule by mouth daily.    Marland Kitchen SYNTHROID 75 MCG tablet Take 1 tablet (75 mcg total) by mouth daily. 90 tablet 1  . telmisartan-hydrochlorothiazide (MICARDIS HCT) 40-12.5 MG tablet TAKE 1 TABLET BY MOUTH EVERY DAY 90 tablet 1  . VESICARE 10 MG tablet Take  1 tablet (10 mg total) by mouth daily. 30 tablet 5  . vitamin C (ASCORBIC ACID) 500 MG tablet Take 500 mg by mouth daily.     No current facility-administered medications on file prior to visit.     Past Medical History:  Diagnosis Date  . Anemia   . Asthma   . Back pain, chronic   . Diverticulosis   . H/O arthrodesis 11/22/2012  . History of hysterectomy   . Hypertension   . Spinal stenosis   . Synovial cyst     Past Surgical History:    Procedure Laterality Date  . ABDOMINAL HYSTERECTOMY    . BACK SURGERY    . CYST REMOVAL TRUNK      Social History   Socioeconomic History  . Marital status: Widowed    Spouse name: Not on file  . Number of children: Not on file  . Years of education: Not on file  . Highest education level: Not on file  Occupational History  . Not on file  Social Needs  . Financial resource strain: Not on file  . Food insecurity:    Worry: Not on file    Inability: Not on file  . Transportation needs:    Medical: Not on file    Non-medical: Not on file  Tobacco Use  . Smoking status: Never Smoker  . Smokeless tobacco: Never Used  Substance and Sexual Activity  . Alcohol use: Yes    Comment: socially  . Drug use: No  . Sexual activity: Not on file  Lifestyle  . Physical activity:    Days per week: Not on file    Minutes per session: Not on file  . Stress: Not on file  Relationships  . Social connections:    Talks on phone: Not on file    Gets together: Not on file    Attends religious service: Not on file    Active member of club or organization: Not on file    Attends meetings of clubs or organizations: Not on file    Relationship status: Not on file  Other Topics Concern  . Not on file  Social History Narrative   Not currently exercising    Family History  Problem Relation Age of Onset  . Other Father        carotid artery disease  . Heart disease Brother   . Alcohol abuse Brother   . Breast cancer Neg Hx     Review of Systems  Constitutional: Negative for fever.  Respiratory: Negative for shortness of breath.   Cardiovascular: Negative for chest pain.  Musculoskeletal: Positive for arthralgias, back pain and neck pain.  Neurological: Negative for numbness and headaches.       Objective:   Vitals:   07/05/17 1008  BP: 134/64  Pulse: 84  Resp: 16  Temp: 98.4 F (36.9 C)  SpO2: 94%   BP Readings from Last 3 Encounters:  07/05/17 134/64  03/29/17 130/84   12/25/16 (!) 156/84   Wt Readings from Last 3 Encounters:  07/05/17 108 lb (49 kg)  03/29/17 111 lb (50.3 kg)  12/25/16 107 lb (48.5 kg)   Body mass index is 23.37 kg/m.   Physical Exam    Constitutional: Appears well-developed and well-nourished. No distress.  HENT:  Head: Normocephalic and atraumatic.  Neck: Neck supple. No tracheal deviation present. No thyromegaly present.  No cervical lymphadenopathy Cardiovascular: Normal rate, regular rhythm and normal heart sounds.   No murmur heard. No  carotid bruit .  No edema Pulmonary/Chest: Effort normal and breath sounds normal. No respiratory distress. No has no wheezes. No rales. Msk:  Arthritic deformities of b/l hands Neurological: Gait normal,  normal sensation bilateral lower extremities. Skin: Skin is warm and dry. Not diaphoretic.  Psychiatric: Normal mood and affect. Behavior is normal.      Assessment & Plan:    See Problem List for Assessment and Plan of chronic medical problems.

## 2017-07-05 ENCOUNTER — Ambulatory Visit: Payer: BC Managed Care – PPO | Admitting: Internal Medicine

## 2017-07-05 ENCOUNTER — Encounter: Payer: Self-pay | Admitting: Internal Medicine

## 2017-07-05 VITALS — BP 134/64 | HR 84 | Temp 98.4°F | Resp 16 | Wt 108.0 lb

## 2017-07-05 DIAGNOSIS — M19041 Primary osteoarthritis, right hand: Secondary | ICD-10-CM

## 2017-07-05 DIAGNOSIS — M545 Low back pain: Secondary | ICD-10-CM

## 2017-07-05 DIAGNOSIS — G8929 Other chronic pain: Secondary | ICD-10-CM

## 2017-07-05 DIAGNOSIS — M19049 Primary osteoarthritis, unspecified hand: Secondary | ICD-10-CM | POA: Insufficient documentation

## 2017-07-05 DIAGNOSIS — M5440 Lumbago with sciatica, unspecified side: Secondary | ICD-10-CM | POA: Diagnosis not present

## 2017-07-05 DIAGNOSIS — M4802 Spinal stenosis, cervical region: Secondary | ICD-10-CM

## 2017-07-05 DIAGNOSIS — M19042 Primary osteoarthritis, left hand: Secondary | ICD-10-CM | POA: Diagnosis not present

## 2017-07-05 MED ORDER — HYDROCODONE-ACETAMINOPHEN 10-325 MG PO TABS: 2.0000 | ORAL_TABLET | Freq: Two times a day (BID) | ORAL | 0 refills | Status: DC

## 2017-07-05 NOTE — Assessment & Plan Note (Signed)
B/l OA - with deformities Can take tylenol up to 3000 mg daily Keep advil to minimal given decreased kidney function Try otc topical arthritis medication Can refer to hand ortho or rheum if she wants

## 2017-07-05 NOTE — Assessment & Plan Note (Signed)
Chronic lower back pain-current pain regimen effective Taking the medication appropriately No change in medication-refilled today Follow-up in 3 months

## 2017-07-05 NOTE — Assessment & Plan Note (Signed)
Chronic neck pain Pain controlled with current pain regimen-continue Follow-up in 3 months

## 2017-07-05 NOTE — Assessment & Plan Note (Addendum)
Indication for chronic opioid: chronic lower back pain with intermittent RLE pain and chronic neck pain s/p surgery Medication and dose: norco 10-325 mg  1-2 tabs BID - often takes 3/day # pills per month: 120 Last UDS date: 12/25/16  Pain contract signed (Y/N): Y, 12/25/16 Date narcotic database last reviewed (include red flags): 07/05/17, no red flags-almost due for refill, prescription sent to pharmacy  Taking medication appropriately.  Overall pain controlled with current regimen Follow-up in 3 months for chronic pain management

## 2017-07-26 ENCOUNTER — Other Ambulatory Visit (INDEPENDENT_AMBULATORY_CARE_PROVIDER_SITE_OTHER): Payer: BC Managed Care – PPO

## 2017-07-26 ENCOUNTER — Ambulatory Visit (INDEPENDENT_AMBULATORY_CARE_PROVIDER_SITE_OTHER)
Admission: RE | Admit: 2017-07-26 | Discharge: 2017-07-26 | Disposition: A | Discharge status: Home / Self Care | Payer: BC Managed Care – PPO | Source: Ambulatory Visit | Attending: Family | Admitting: Family

## 2017-07-26 ENCOUNTER — Ambulatory Visit: Payer: BC Managed Care – PPO | Admitting: Family

## 2017-07-26 ENCOUNTER — Encounter: Payer: Self-pay | Admitting: Family

## 2017-07-26 ENCOUNTER — Ambulatory Visit: Payer: Self-pay | Admitting: *Deleted

## 2017-07-26 VITALS — BP 138/70 | HR 76 | Temp 98.1°F | Ht <= 58 in | Wt 104.1 lb

## 2017-07-26 DIAGNOSIS — R110 Nausea: Secondary | ICD-10-CM | POA: Diagnosis not present

## 2017-07-26 DIAGNOSIS — R634 Abnormal weight loss: Secondary | ICD-10-CM | POA: Diagnosis not present

## 2017-07-26 DIAGNOSIS — R350 Frequency of micturition: Secondary | ICD-10-CM | POA: Diagnosis not present

## 2017-07-26 DIAGNOSIS — M791 Myalgia, unspecified site: Secondary | ICD-10-CM

## 2017-07-26 LAB — CBC WITH DIFFERENTIAL/PLATELET
Basophils Absolute: 0 10*3/uL (ref 0.0–0.1)
Basophils Relative: 0.6 % (ref 0.0–3.0)
EOS ABS: 0.1 10*3/uL (ref 0.0–0.7)
Eosinophils Relative: 1 % (ref 0.0–5.0)
HCT: 36.8 % (ref 36.0–46.0)
Hemoglobin: 12.3 g/dL (ref 12.0–15.0)
LYMPHS ABS: 2.3 10*3/uL (ref 0.7–4.0)
Lymphocytes Relative: 28.5 % (ref 12.0–46.0)
MCHC: 33.3 g/dL (ref 30.0–36.0)
MCV: 91.6 fl (ref 78.0–100.0)
Monocytes Absolute: 0.6 10*3/uL (ref 0.1–1.0)
Monocytes Relative: 7.9 % (ref 3.0–12.0)
NEUTROS ABS: 4.9 10*3/uL (ref 1.4–7.7)
NEUTROS PCT: 62 % (ref 43.0–77.0)
PLATELETS: 219 10*3/uL (ref 150.0–400.0)
RBC: 4.02 Mil/uL (ref 3.87–5.11)
RDW: 13.9 % (ref 11.5–15.5)
WBC: 8 10*3/uL (ref 4.0–10.5)

## 2017-07-26 LAB — COMPREHENSIVE METABOLIC PANEL
ALT: 15 U/L (ref 0–35)
AST: 20 U/L (ref 0–37)
Albumin: 4.5 g/dL (ref 3.5–5.2)
Alkaline Phosphatase: 62 U/L (ref 39–117)
BUN: 26 mg/dL — AB (ref 6–23)
CO2: 28 meq/L (ref 19–32)
CREATININE: 1.06 mg/dL (ref 0.40–1.20)
Calcium: 10.1 mg/dL (ref 8.4–10.5)
Chloride: 97 mEq/L (ref 96–112)
GFR: 53.63 mL/min — ABNORMAL LOW (ref >60.00)
GLUCOSE: 107 mg/dL — AB (ref 70–99)
Potassium: 4 mEq/L (ref 3.5–5.1)
Sodium: 136 mEq/L (ref 135–145)
Total Bilirubin: 0.5 mg/dL (ref 0.2–1.2)
Total Protein: 7.6 g/dL (ref 6.0–8.3)

## 2017-07-26 LAB — VITAMIN B12: VITAMIN B 12: 578 pg/mL (ref 211–911)

## 2017-07-26 LAB — TSH: TSH: 0.58 u[IU]/mL (ref 0.35–4.50)

## 2017-07-26 MED ORDER — DULOXETINE HCL 20 MG PO CPEP
20.0000 mg | ORAL_CAPSULE | Freq: Every day | ORAL | 1 refills | Status: DC
Start: 2017-07-26 — End: 2017-10-07

## 2017-07-26 NOTE — Progress Notes (Signed)
Tonya [name] is a 77 y.o. female with the following history as recorded in Epic[employer]:  Patient Active Problem List   Diagnosis Date Noted  . Osteoarthritis of hand 07/05/2017  . Gross hematuria 10/15/2016  . Prediabetes 09/21/2016  . Encounter for chronic pain management 05/27/2016  . Fatigue 01/15/2016  . Chronic lower back pain 01/15/2016  . Osteopenia, high FRAX 01/14/2016  . DDD (degenerative disc disease), cervical S/P Decompressive laminectomy and Fusion 11/15/2012  . Asthma 11/15/2012  . Hypothyroidism 11/15/2012  . Depression 11/15/2012  . Constipation 11/15/2012  . Overactive bladder 11/15/2012  . Anxiety 11/15/2012  . Cervical spinal stenosis 10/24/2012  . Fracture of cervical vertebra ([place]) 08/31/2012  . Scoliosis 07/05/2012  . Degeneration of intervertebral disc of lumbosacral region 07/04/2012  . HYPERTENSION, BENIGN 04/21/2007    Current Outpatient Medications  Medication Sig Dispense Refill  . budesonide-formoterol (SYMBICORT) 80-4.5 MCG/ACT inhaler Inhale 2 puffs into the lungs 2 (two) times daily. 1 Inhaler 5  . cycloSPORINE (RESTASIS) 0.05 % ophthalmic emulsion Place 1 drop into both eyes 2 (two) times daily.    . DULoxetine (CYMBALTA) 20 MG capsule Take 20 mg by mouth daily.    . fish oil-omega-3 fatty acids 1000 MG capsule Take 2 g by mouth daily.    Marland Kitchen gabapentin (NEURONTIN) 300 MG capsule Take 1 capsule (300 mg total) by mouth daily as needed (pain). 30 capsule 0  . HYDROcodone-acetaminophen (NORCO) 10-325 MG tablet Take 2 tablets by mouth 2 (two) times daily. For chronic lower back and neck pain 120 tablet 0  . levalbuterol (XOPENEX HFA) 45 MCG/ACT inhaler Inhale 1-2 puffs into the lungs every 4 (four) hours as needed for wheezing. 1 Inhaler 8  . multivitamin-lutein (OCUVITE-LUTEIN) CAPS capsule Take 1 capsule by mouth daily.    Marland Kitchen SYNTHROID 75 MCG tablet Take 1 tablet (75 mcg total) by mouth daily. 90 tablet 1  . telmisartan-hydrochlorothiazide (MICARDIS  HCT) 40-12.5 MG tablet TAKE 1 TABLET BY MOUTH EVERY DAY 90 tablet 1  . VESICARE 10 MG tablet Take 1 tablet (10 mg total) by mouth daily. 30 tablet 5  . vitamin C (ASCORBIC ACID) 500 MG tablet Take 500 mg by mouth daily.    . DULoxetine (CYMBALTA) 20 MG capsule Take 1 capsule (20 mg total) by mouth daily. 30 capsule 1   No current facility-administered medications for this visit.     Allergies: Prochlorperazine edisylate and Other  Past Medical History:  Diagnosis Date  . Anemia   . Asthma   . Back pain, chronic   . Diverticulosis   . H/O arthrodesis 11/22/2012  . History of hysterectomy   . Hypertension   . Spinal stenosis   . Synovial cyst     Past Surgical History:  Procedure Laterality Date  . ABDOMINAL HYSTERECTOMY    . BACK SURGERY    . CYST REMOVAL TRUNK      Family History  Problem Relation Age of Onset  . Other Father        carotid artery disease  . Heart disease Brother   . Alcohol abuse Brother   . Breast cancer Neg Hx     Social History   Tobacco Use  . Smoking status: Never Smoker  . Smokeless tobacco: Never Used  Substance Use Topics  . Alcohol use: Yes    Comment: socially    Subjective:  Patient presents with concerns unexplained nausea which started last week; has been having increased burping/ belching- no diagnosed history of GERD;  admits that when bends over, "tasting bile." Having sensation of difficulty taking a "deep breath." Has history of asthma- taking Symbicort 2 puffs bid; not having to use rescue inhaler; notes that appetite is down; no changes in bowel movements; denies any burning on urination/ admits to increased urinary frequency; + night sweats- "off and on" for months; has lost 7 pounds since January 2019- unexplained; having some increased anxiety secondary to worrying about all of these symptoms;  Of note, patient had decreased to 20 mg of Cymbalta approximately 3 months ago; she stopped this medication within the past week but thinks  that the nausea was present while she on the Cymbalta. Notes that her head feels "heavy" and some ringing in her ears;   Objective:  Vitals:   07/26/17 1537  BP: 138/70  Pulse: 76  Temp: 98.1 F (36.7 C)  TempSrc: Oral  SpO2: 97%  Weight: 104 lb 1.9 oz (47.2 kg)  Height: '4\' 9"'  (1.448 m)    General: Well developed, well nourished, in no acute distress  Skin : Warm and dry.  Head: Normocephalic and atraumatic  Eyes: Sclera and conjunctiva clear; pupils round and reactive to light; extraocular movements intact  Ears: External normal; canals clear; tympanic membranes normal  Oropharynx: Pink, supple. No suspicious lesions  Neck: Supple without thyromegaly, adenopathy  Lungs: Respirations unlabored; clear to auscultation bilaterally without wheeze, rales, rhonchi  CVS exam: normal rate and regular rhythm.  Abdomen: Soft; nontender; nondistended; normoactive bowel sounds; no masses or hepatosplenomegaly  Musculoskeletal: No deformities; no active joint inflammation  Extremities: No edema, cyanosis, clubbing  Vessels: Symmetric bilaterally  Neurologic: Alert and oriented; speech intact; face symmetrical; moves all extremities well; CNII-XII intact without focal deficit  Assessment:  1. Nausea   2. Weight loss   3. Myalgia   4. Urinary frequency     Plan:  ? If some of her symptoms could be related to stopping Cymbalta last week- will re-start 20 mg for now and do a more extended taper once source of symptoms is found; will update CXR today as well as labs and urine culture; may also need to consider abdominal/ pelvic CT; follow-up to be determined.   Spent 30 minutes with patient; greater than 50% spent in counseling;    No follow-ups on file.  Orders Placed This Encounter  Procedures  . Urine Culture    Standing Status:   Future    Number of Occurrences:   1    Standing Expiration Date:   07/26/2018  . DG Chest 2 View    Standing Status:   Future    Number of Occurrences:    1    Standing Expiration Date:   09/26/2018    Order Specific Question:   Reason for Exam (SYMPTOM  OR DIAGNOSIS REQUIRED)    Answer:   night sweats    Order Specific Question:   Preferred imaging location?    Answer:   [facility]    Order Specific Question:   Radiology Contrast Protocol - do NOT remove file path    Answer:   [email or web address]  . CBC w/Diff    Standing Status:   Future    Number of Occurrences:   1    Standing Expiration Date:   07/26/2018  . Comp Met (CMET)    Standing Status:   Future    Number of Occurrences:   1    Standing Expiration Date:   07/26/2018  . TSH    Standing  Status:   Future    Number of Occurrences:   1    Standing Expiration Date:   07/26/2018  . B12    Standing Status:   Future    Number of Occurrences:   1    Standing Expiration Date:   07/26/2018  . Lyme Ab/Western Blot Reflex    Standing Status:   Future    Number of Occurrences:   1    Standing Expiration Date:   07/27/2018    Requested Prescriptions   Signed Prescriptions Disp Refills  . DULoxetine (CYMBALTA) 20 MG capsule 30 capsule 1    Sig: Take 1 capsule (20 mg total) by mouth daily.

## 2017-07-26 NOTE — Telephone Encounter (Signed)
Patient reports symptoms of nausea x 10 days.Patient report feeling somewhat fuzzy  - No Vertigo. She reports some weight loss recently as well.She reports her BP yesterday at church was 102/61.Appt made today with Jodi Mourning. Precautions advised to the patient.   Reason for Disposition . Nausea persists > 1 week  Answer Assessment - Initial Assessment Questions 1. NAUSEA SEVERITY: "How bad is the nausea?" (e.g., mild, moderate, severe; dehydration, weight loss)   - MILD: loss of appetite without change in eating habits   - MODERATE: decreased oral intake without significant weight loss, dehydration, or malnutrition   - SEVERE: inadequate caloric or fluid intake, significant weight loss, symptoms of dehydration     moderate 2. ONSET: "When did the nausea begin?"      10  Days  ago 3. VOMITING: "Any vomiting?" If so, ask: "How many times today?"       No 4. RECURRENT SYMPTOM: "Have you had nausea before?" If so, ask: "When was the last time?" "What happened that time?"       Yes   5. CAUSE: "What do you think is causing the nausea?"     Gerd   6. PREGNANCY: "Is there any chance you are pregnant?" (e.g., unprotected intercourse, missed birth control pill, broken condom)      n/a  Protocols used: NAUSEA-A-AH

## 2017-07-27 ENCOUNTER — Other Ambulatory Visit: Payer: Self-pay | Admitting: Family

## 2017-07-27 ENCOUNTER — Telehealth: Payer: Self-pay | Admitting: Internal Medicine

## 2017-07-27 LAB — LYME AB/WESTERN BLOT REFLEX: LYME DISEASE AB, QUANT, IGM: <0.8 index (ref 0.00–0.79)

## 2017-07-27 MED ORDER — ONDANSETRON 4 MG PO TBDP: 4.0000 mg | ORAL_TABLET | Freq: Three times a day (TID) | ORAL | 0 refills | Status: DC | PRN

## 2017-07-27 NOTE — Telephone Encounter (Signed)
Copied from [place] [phone]. Topic: Quick Communication - See Telephone Encounter >> Jul 27, 2017  4:06 PM Tonya [name] wrote: CRM for notification. See Telephone encounter for: 07/27/17.  Pt is asking for lab results and chest xray results

## 2017-07-28 ENCOUNTER — Telehealth: Payer: Self-pay | Admitting: Internal Medicine

## 2017-07-28 LAB — URINE CULTURE
MICRO NUMBER:: 90610466
SPECIMEN QUALITY:: ADEQUATE

## 2017-07-28 NOTE — Telephone Encounter (Signed)
Per Lab results, Angela Nevin spoke with pt at 4:36 pm to give results.

## 2017-07-28 NOTE — Telephone Encounter (Signed)
Copied from [place] [phone]. Topic: General - Other >> Jul 28, 2017  4:55 PM Yvette Rack wrote: Reason for CRM: Pt request call back to discuss lab results.

## 2017-07-29 NOTE — Telephone Encounter (Signed)
(  FYI)  Spoke with patient. She wants to check her pressure at home and check it again in the morning. If it is still running low she will call back in the am for appointment on nurse schedule for blood pressure recheck.

## 2017-07-29 NOTE — Telephone Encounter (Signed)
Contacted pt with information per Dr Quay Burow, "Please let her know that urine culture and Lyme titer were negative; I think we should get a picture of her belly if she is still having the symptoms- any change with re-starting Cymbalta?"; she verbalizes understanding and states that the cymbalta has helped some, and she would like to wait on the picture of her belly; will route to office for notification of this encounter.

## 2017-07-29 NOTE — Telephone Encounter (Signed)
(  FYI)  Patient returned call to clinic. She verbalized understanding from nurse giving her instructions. She wants to wait on the picture of her belly and Cymbalta has helped some.

## 2017-07-29 NOTE — Telephone Encounter (Signed)
(  FYI)  Spoke with patient today and info given. She will follow back up with [place] next Thurs/Fri with results. She also seems to think that Cymbalta may be causing her symptoms.  Also she said she saw physician that prescribed her the Cymbalta today and during the visit her BP was 77/44. She was curious as if she should be concerned with it being low because the provider she saw didn't seem to be. She also wanted to get your thoughts on the telmisartin. She wondered if it could be causing her pressure to be low also.

## 2017-07-29 NOTE — Telephone Encounter (Signed)
That sounds extremely low. Would question if that was a digital wrist cuff- reading sounds inaccurate to me. She needs to drink some Gatorade to get some fluids and a little salt to help get her pressure up. Can she come here tomorrow and just let one of the nurses check her pressure please?

## 2017-07-29 NOTE — Telephone Encounter (Signed)
Ask her to call back in about a week with update please; if the Cymbalta is the source which seems very likely, we can get her off the medication. We just need to do it differently to prevent this from happening again.

## 2017-08-25 ENCOUNTER — Other Ambulatory Visit: Payer: Self-pay | Admitting: Internal Medicine

## 2017-08-25 DIAGNOSIS — M5440 Lumbago with sciatica, unspecified side: Principal | ICD-10-CM

## 2017-08-25 DIAGNOSIS — G8929 Other chronic pain: Secondary | ICD-10-CM

## 2017-08-25 NOTE — Telephone Encounter (Signed)
Copied from [place] [phone]. Topic: Quick Communication - Rx Refill/Question >> Aug 25, 2017  4:40 PM Bea Graff, NT wrote: Medication: HYDROcodone-acetaminophen (NORCO) 10-325 MG tablet   Has the patient contacted their pharmacy? Yes.   (Agent: If no, request that the patient contact the pharmacy for the refill.) (Agent: If yes, when and what did the pharmacy advise?)  Preferred Pharmacy (with phone number or street name):   [facility] [place] [phone] (Phone) [phone] (Fax)      Agent: Please be advised that RX refills may take up to 3 business days. We ask that you follow-up with your pharmacy.

## 2017-08-26 MED ORDER — HYDROCODONE-ACETAMINOPHEN 10-325 MG PO TABS: 2.0000 | ORAL_TABLET | Freq: Two times a day (BID) | ORAL | 0 refills | Status: DC

## 2017-08-26 NOTE — Telephone Encounter (Signed)
Check [place] registry last filled 07/06/2017. Will hold until MD return tomorrow for approval../lmb

## 2017-08-26 NOTE — Telephone Encounter (Signed)
LOV 07/05/17 Dr. Quay Burow Last refill 07/05/17  # 120  With 0 refill

## 2017-08-27 MED ORDER — HYDROCODONE-ACETAMINOPHEN 10-325 MG PO TABS: 2.0000 | ORAL_TABLET | Freq: Two times a day (BID) | ORAL | 0 refills | Status: DC

## 2017-08-27 NOTE — Addendum Note (Signed)
Addended by: Terence Lux B on: 08/27/2017 01:15 PM   Modules accepted: Orders

## 2017-08-27 NOTE — Telephone Encounter (Signed)
[facility], can this be called into [facility]. Call back [phone]

## 2017-08-27 NOTE — Telephone Encounter (Signed)
lmovm that this had been resolved by Dr Quay Burow

## 2017-08-27 NOTE — Telephone Encounter (Signed)
Pt states the reason to move the Rx is that [facility] is out of this med.  They unable to order because the main pharmacist is on vacation.  [facility] needs [facility] this one [facility]

## 2017-09-06 ENCOUNTER — Other Ambulatory Visit: Payer: Self-pay | Admitting: Internal Medicine

## 2017-09-10 ENCOUNTER — Other Ambulatory Visit: Payer: Self-pay | Admitting: Internal Medicine

## 2017-10-02 NOTE — Progress Notes (Signed)
Subjective:    Patient ID: Tonya [name], female    DOB: Jan 30, 1941, 77 y.o.   MRN: [id number]  HPI    Medications and allergies reviewed with patient and updated if appropriate.  Patient Active Problem List   Diagnosis Date Noted  . Osteoarthritis of hand 07/05/2017  . Gross hematuria 10/15/2016  . Prediabetes 09/21/2016  . Encounter for chronic pain management 05/27/2016  . Fatigue 01/15/2016  . Chronic lower back pain 01/15/2016  . Osteopenia, high FRAX 01/14/2016  . DDD (degenerative disc disease), cervical S/P Decompressive laminectomy and Fusion 11/15/2012  . Asthma 11/15/2012  . Hypothyroidism 11/15/2012  . Depression 11/15/2012  . Constipation 11/15/2012  . Overactive bladder 11/15/2012  . Anxiety 11/15/2012  . Cervical spinal stenosis 10/24/2012  . Fracture of cervical vertebra ([place]) 08/31/2012  . Scoliosis 07/05/2012  . Degeneration of intervertebral disc of lumbosacral region 07/04/2012  . HYPERTENSION, BENIGN 04/21/2007    Current Outpatient Medications on File Prior to Visit  Medication Sig Dispense Refill  . cycloSPORINE (RESTASIS) 0.05 % ophthalmic emulsion Place 1 drop into both eyes 2 (two) times daily.    . DULoxetine (CYMBALTA) 20 MG capsule Take 20 mg by mouth daily.    . DULoxetine (CYMBALTA) 20 MG capsule Take 1 capsule (20 mg total) by mouth daily. 30 capsule 1  . fish oil-omega-3 fatty acids 1000 MG capsule Take 2 g by mouth daily.    Marland Kitchen gabapentin (NEURONTIN) 300 MG capsule Take 1 capsule (300 mg total) by mouth daily as needed (pain). 30 capsule 0  . HYDROcodone-acetaminophen (NORCO) 10-325 MG tablet Take 2 tablets by mouth 2 (two) times daily. For chronic lower back and neck pain 120 tablet 0  . levalbuterol (XOPENEX HFA) 45 MCG/ACT inhaler Inhale 1-2 puffs into the lungs every 4 (four) hours as needed for wheezing. 1 Inhaler 8  . multivitamin-lutein (OCUVITE-LUTEIN) CAPS capsule Take 1 capsule by mouth daily.    . ondansetron (ZOFRAN ODT)  4 MG disintegrating tablet Take 1 tablet (4 mg total) by mouth every 8 (eight) hours as needed for nausea or vomiting. 20 tablet 0  . SYMBICORT 80-4.5 MCG/ACT inhaler TAKE 2 PUFFS BY MOUTH TWICE A DAY 10.2 Inhaler 5  . SYNTHROID 75 MCG tablet TAKE 1 TABLET BY MOUTH EVERY DAY 90 tablet 0  . telmisartan-hydrochlorothiazide (MICARDIS HCT) 40-12.5 MG tablet TAKE 1 TABLET BY MOUTH EVERY DAY 90 tablet 1  . VESICARE 10 MG tablet Take 1 tablet (10 mg total) by mouth daily. 30 tablet 5  . vitamin C (ASCORBIC ACID) 500 MG tablet Take 500 mg by mouth daily.     No current facility-administered medications on file prior to visit.     Past Medical History:  Diagnosis Date  . Anemia   . Asthma   . Back pain, chronic   . Diverticulosis   . H/O arthrodesis 11/22/2012  . History of hysterectomy   . Hypertension   . Spinal stenosis   . Synovial cyst     Past Surgical History:  Procedure Laterality Date  . ABDOMINAL HYSTERECTOMY    . BACK SURGERY    . CYST REMOVAL TRUNK      Social History   Socioeconomic History  . Marital status: Widowed    Spouse name: Not on file  . Number of children: Not on file  . Years of education: Not on file  . Highest education level: Not on file  Occupational History  . Not on file  Social Needs  .  Financial resource strain: Not on file  . Food insecurity:    Worry: Not on file    Inability: Not on file  . Transportation needs:    Medical: Not on file    Non-medical: Not on file  Tobacco Use  . Smoking status: Never Smoker  . Smokeless tobacco: Never Used  Substance and Sexual Activity  . Alcohol use: Yes    Comment: socially  . Drug use: No  . Sexual activity: Not on file  Lifestyle  . Physical activity:    Days per week: Not on file    Minutes per session: Not on file  . Stress: Not on file  Relationships  . Social connections:    Talks on phone: Not on file    Gets together: Not on file    Attends religious service: Not on file    Active  member of club or organization: Not on file    Attends meetings of clubs or organizations: Not on file    Relationship status: Not on file  Other Topics Concern  . Not on file  Social History Narrative   Not currently exercising    Family History  Problem Relation Age of Onset  . Other Father        carotid artery disease  . Heart disease Brother   . Alcohol abuse Brother   . Breast cancer Neg Hx     Review of Systems     Objective:  There were no vitals filed for this visit. BP Readings from Last 3 Encounters:  07/26/17 138/70  07/05/17 134/64  03/29/17 130/84   Wt Readings from Last 3 Encounters:  07/26/17 104 lb 1.9 oz (47.2 kg)  07/05/17 108 lb (49 kg)  03/29/17 111 lb (50.3 kg)   There is no height or weight on file to calculate BMI.   Physical Exam          Assessment & Plan:    See Problem List for Assessment and Plan of chronic medical problems.   This encounter was created in error - please disregard.

## 2017-10-04 ENCOUNTER — Encounter: Payer: BC Managed Care – PPO | Admitting: Internal Medicine

## 2017-10-05 ENCOUNTER — Other Ambulatory Visit: Payer: Self-pay | Admitting: Internal Medicine

## 2017-10-05 DIAGNOSIS — G8929 Other chronic pain: Secondary | ICD-10-CM

## 2017-10-05 DIAGNOSIS — M5440 Lumbago with sciatica, unspecified side: Principal | ICD-10-CM

## 2017-10-05 MED ORDER — HYDROCODONE-ACETAMINOPHEN 10-325 MG PO TABS: 2.0000 | ORAL_TABLET | Freq: Two times a day (BID) | ORAL | 0 refills | Status: DC

## 2017-10-05 NOTE — Telephone Encounter (Signed)
Copied from [place] [id number]. Topic: Quick Communication - Rx Refill/Question >> Oct 05, 2017 10:44 AM Oliver Pila B wrote: Medication: HYDROcodone-acetaminophen (NORCO) 10-325 MG tablet [[id number]]   Pt has appt on Friday pt called to ask for a temporary refill until appt; contact to advise

## 2017-10-05 NOTE — Telephone Encounter (Signed)
[place] Controlled Substance Database checked. Last filled on 08/27/17 

## 2017-10-07 ENCOUNTER — Other Ambulatory Visit: Payer: Self-pay

## 2017-10-07 MED ORDER — DULOXETINE HCL 20 MG PO CPEP
20.0000 mg | ORAL_CAPSULE | Freq: Every day | ORAL | 1 refills | Status: DC
Start: 2017-10-07 — End: 2017-10-09

## 2017-10-07 NOTE — Progress Notes (Signed)
Subjective:    Patient ID: Tonya [name], female    DOB: 02/09/1941, 77 y.o.   MRN: [id number]  HPI The patient is here for follow up.  Osteopenia with high FRAX: She did not receive her results from last fall regarding her bone density-they were sent a MyChart.  She wonders if she needs to be on medication.  She is currently not taking any calcium or vitamin D.  She is not currently exercising regularly.  Hypertension: She saw Mickel Baas in May for low BP.  She is taking her medication daily. She is compliant with a low sodium diet.  She rarely gets edema.  She denies chest pain, palpitations, edema, shortness of breath and regular headaches. She is not exercising regularly due to her chronic pain.  She does monitor her blood pressure at home - 93/51, 112/61, 137/78, 118/68, 93/58, 114/73, 97/51, 117/64.  When her BP was on the low side she felt fuzzy headed and a little tired.    Hypothyroidism:  She is taking her medication daily.  She denies any recent changes in energy or weight that are unexplained.   Prediabetes:  She is compliant with a low sugar/carbohydrate diet.  She is exercising regularly.  Asthma:  She uses symbicort BID and xopenex occasionally.    Depression, anxiety: She is taking her medication daily as prescribed. She denies any side effects from the medication. She feels her depression and anxiety are well controlled and she is happy with her current dose of medication.   The patient is here for follow up for chronic pain management.  Indication for chronic opioid: Chronic lower back pain with intermittent right lower extremity pain, chronic neck pain Medication and dose: Norco 10-325 mg 1-2 tabs twice daily-typically takes 3 tabs per day # pills per month: 120 Last UDS date: 12/25/2016 Pain contract signed (Y/N): Signed, 12/24/2017 Date narcotic database last reviewed (include red flags): 10/08/2017  Pain assessment:  Pain intensity: 5/10  Amount of pain relief  with medication: Significant relief Use of pain medications: Takes medication twice a day as prescribed Side effects: None Sleep: Does not sleep good because of her dog Mood: Depression and anxiety controlled Functional/social activities: continues to be active in home setting   Last took prescribed pain medication: This morning Alcohol use: Rare Tobacco use: None Street Drug use: None    Medications and allergies reviewed with patient and updated if appropriate.  Patient Active Problem List   Diagnosis Date Noted  . Osteoarthritis of hand 07/05/2017  . Gross hematuria 10/15/2016  . Prediabetes 09/21/2016  . Encounter for chronic pain management 05/27/2016  . Fatigue 01/15/2016  . Chronic lower back pain 01/15/2016  . Osteopenia, high FRAX 01/14/2016  . DDD (degenerative disc disease), cervical S/P Decompressive laminectomy and Fusion 11/15/2012  . Asthma 11/15/2012  . Hypothyroidism 11/15/2012  . Depression 11/15/2012  . Constipation 11/15/2012  . Overactive bladder 11/15/2012  . Anxiety 11/15/2012  . Cervical spinal stenosis 10/24/2012  . Fracture of cervical vertebra ([place]) 08/31/2012  . Scoliosis 07/05/2012  . Degeneration of intervertebral disc of lumbosacral region 07/04/2012  . HYPERTENSION, BENIGN 04/21/2007    Current Outpatient Medications on File Prior to Visit  Medication Sig Dispense Refill  . cycloSPORINE (RESTASIS) 0.05 % ophthalmic emulsion Place 1 drop into both eyes 2 (two) times daily.    . DULoxetine (CYMBALTA) 20 MG capsule Take 1 capsule (20 mg total) by mouth daily. 30 capsule 1  . fish oil-omega-3 fatty acids 1000  MG capsule Take 2 g by mouth daily.    Marland Kitchen gabapentin (NEURONTIN) 300 MG capsule Take 1 capsule (300 mg total) by mouth daily as needed (pain). 30 capsule 0  . HYDROcodone-acetaminophen (NORCO) 10-325 MG tablet Take 2 tablets by mouth 2 (two) times daily. For chronic lower back and neck pain 60 tablet 0  . levalbuterol (XOPENEX HFA) 45  MCG/ACT inhaler Inhale 1-2 puffs into the lungs every 4 (four) hours as needed for wheezing. 1 Inhaler 8  . multivitamin-lutein (OCUVITE-LUTEIN) CAPS capsule Take 1 capsule by mouth daily.    . SYMBICORT 80-4.5 MCG/ACT inhaler TAKE 2 PUFFS BY MOUTH TWICE A DAY 10.2 Inhaler 5  . SYNTHROID 75 MCG tablet TAKE 1 TABLET BY MOUTH EVERY DAY 90 tablet 0  . telmisartan-hydrochlorothiazide (MICARDIS HCT) 40-12.5 MG tablet TAKE 1 TABLET BY MOUTH EVERY DAY 90 tablet 1  . VESICARE 10 MG tablet Take 1 tablet (10 mg total) by mouth daily. 30 tablet 5  . vitamin C (ASCORBIC ACID) 500 MG tablet Take 500 mg by mouth daily.    . ondansetron (ZOFRAN ODT) 4 MG disintegrating tablet Take 1 tablet (4 mg total) by mouth every 8 (eight) hours as needed for nausea or vomiting. (Patient not taking: Reported on 10/08/2017) 20 tablet 0   No current facility-administered medications on file prior to visit.     Past Medical History:  Diagnosis Date  . Anemia   . Asthma   . Back pain, chronic   . Diverticulosis   . H/O arthrodesis 11/22/2012  . History of hysterectomy   . Hypertension   . Spinal stenosis   . Synovial cyst     Past Surgical History:  Procedure Laterality Date  . ABDOMINAL HYSTERECTOMY    . BACK SURGERY    . CYST REMOVAL TRUNK      Social History   Socioeconomic History  . Marital status: Widowed    Spouse name: Not on file  . Number of children: Not on file  . Years of education: Not on file  . Highest education level: Not on file  Occupational History  . Not on file  Social Needs  . Financial resource strain: Not on file  . Food insecurity:    Worry: Not on file    Inability: Not on file  . Transportation needs:    Medical: Not on file    Non-medical: Not on file  Tobacco Use  . Smoking status: Never Smoker  . Smokeless tobacco: Never Used  Substance and Sexual Activity  . Alcohol use: Yes    Comment: socially  . Drug use: No  . Sexual activity: Not on file  Lifestyle  .  Physical activity:    Days per week: Not on file    Minutes per session: Not on file  . Stress: Not on file  Relationships  . Social connections:    Talks on phone: Not on file    Gets together: Not on file    Attends religious service: Not on file    Active member of club or organization: Not on file    Attends meetings of clubs or organizations: Not on file    Relationship status: Not on file  Other Topics Concern  . Not on file  Social History Narrative   Not currently exercising    Family History  Problem Relation Age of Onset  . Other Father        carotid artery disease  . Heart disease Brother   .  Alcohol abuse Brother   . Breast cancer Neg Hx     Review of Systems  Constitutional: Negative for chills and fever.  Respiratory: Negative for cough, shortness of breath and wheezing.   Cardiovascular: Negative for chest pain, palpitations and leg swelling.  Neurological: Negative for light-headedness and headaches.       Objective:   Vitals:   10/08/17 0817  BP: 138/70  Pulse: 84  SpO2: 98%   BP Readings from Last 3 Encounters:  10/08/17 138/70  07/26/17 138/70  07/05/17 134/64   Wt Readings from Last 3 Encounters:  10/08/17 102 lb (46.3 kg)  07/26/17 104 lb 1.9 oz (47.2 kg)  07/05/17 108 lb (49 kg)   Body mass index is 22.27 kg/m.   Physical Exam    Constitutional: Appears well-developed and well-nourished. No distress.  HENT:  Head: Normocephalic and atraumatic.  Neck: Neck supple. No tracheal deviation present. No thyromegaly present.  No cervical lymphadenopathy Cardiovascular: Normal rate, regular rhythm and normal heart sounds.   No murmur heard. No carotid bruit .  No edema Pulmonary/Chest: Effort normal and breath sounds normal. No respiratory distress. No has no wheezes. No rales.  Skin: Skin is warm and dry. Not diaphoretic.  Psychiatric: Normal mood and affect. Behavior is normal.      Assessment & Plan:    See Problem List for  Assessment and Plan of chronic medical problems.

## 2017-10-08 ENCOUNTER — Telehealth: Payer: Self-pay

## 2017-10-08 ENCOUNTER — Encounter: Payer: Self-pay | Admitting: Internal Medicine

## 2017-10-08 ENCOUNTER — Ambulatory Visit: Payer: BC Managed Care – PPO | Admitting: Internal Medicine

## 2017-10-08 VITALS — BP 138/70 | HR 84 | Ht <= 58 in | Wt 102.0 lb

## 2017-10-08 DIAGNOSIS — R7303 Prediabetes: Secondary | ICD-10-CM | POA: Diagnosis not present

## 2017-10-08 DIAGNOSIS — M5440 Lumbago with sciatica, unspecified side: Secondary | ICD-10-CM

## 2017-10-08 DIAGNOSIS — F419 Anxiety disorder, unspecified: Secondary | ICD-10-CM

## 2017-10-08 DIAGNOSIS — E039 Hypothyroidism, unspecified: Secondary | ICD-10-CM | POA: Diagnosis not present

## 2017-10-08 DIAGNOSIS — G8929 Other chronic pain: Secondary | ICD-10-CM

## 2017-10-08 DIAGNOSIS — M8580 Other specified disorders of bone density and structure, unspecified site: Secondary | ICD-10-CM

## 2017-10-08 DIAGNOSIS — I10 Essential (primary) hypertension: Secondary | ICD-10-CM | POA: Diagnosis not present

## 2017-10-08 DIAGNOSIS — F3289 Other specified depressive episodes: Secondary | ICD-10-CM | POA: Diagnosis not present

## 2017-10-08 DIAGNOSIS — M4802 Spinal stenosis, cervical region: Secondary | ICD-10-CM

## 2017-10-08 MED ORDER — HYDROCODONE-ACETAMINOPHEN 10-325 MG PO TABS: 2.0000 | ORAL_TABLET | Freq: Two times a day (BID) | ORAL | 0 refills | Status: DC

## 2017-10-08 MED ORDER — TELMISARTAN 40 MG PO TABS: 40.0000 mg | ORAL_TABLET | Freq: Every day | ORAL | 1 refills | Status: DC

## 2017-10-08 NOTE — Assessment & Plan Note (Signed)
Check tsh  Titrate med dose if needed  

## 2017-10-08 NOTE — Assessment & Plan Note (Signed)
Pain adequately controlled with current pain regimen Continue current medication Follow-up in 3 months

## 2017-10-08 NOTE — Assessment & Plan Note (Signed)
Indication for chronic opioid: Chronic lower back pain with intermittent right lower extremity pain, chronic neck pain Medication and dose: Norco 10-325 mg 1-2 tabs twice daily-typically takes 3 tabs per day # pills per month: 120 Last UDS date: 12/25/2016 Pain contract signed (Y/N): Signed, 12/24/2017 Date narcotic database last reviewed (include red flags): 10/08/2017  Taking medication appropriately and medication is effective Does need a refill now, but to be filled later this month-was only given half of a prescription with her last refill Follow-up in 3 months

## 2017-10-08 NOTE — Telephone Encounter (Signed)
-----   Message from [name], MD sent at 10/08/2017  9:02 AM EDT ----- Osteopenia with high frax - would like to take prolia - can we look into cost

## 2017-10-08 NOTE — Assessment & Plan Note (Addendum)
Reviewed dexa - she did not know results prior Discussed high risk of fracture Start calcium and vitamin daily-given the amounts that I would like her to take Exercise as much as possible She is interested in medication-was on believe in the past.  I think she would do well with Prolia we will check into the cost Discussed potential side effects

## 2017-10-08 NOTE — Assessment & Plan Note (Signed)
Controlled, stable Continue current dose of medication  

## 2017-10-08 NOTE — Assessment & Plan Note (Signed)
BP on low side Stop hctz 12.5 mg Continue telmisartan 40 mg daily Monitor BP daily

## 2017-10-08 NOTE — Assessment & Plan Note (Signed)
Eating fairly well Encouraged regular exercise A1c has been fairly controlled and we will hold off on checking this until her next visit

## 2017-10-08 NOTE — Assessment & Plan Note (Signed)
Indication for chronic opioid: Chronic lower back pain with intermittent right lower extremity pain, chronic neck pain Medication and dose: Norco 10-325 mg 1-2 tabs twice daily-typically takes 3 tabs per day # pills per month: 120 Last UDS date: 12/25/2016 Pain contract signed (Y/N): Signed, 12/24/2017 Date narcotic database last reviewed (include red flags): 10/08/2017  Taking medication appropriately without evidence of aberrant use Continue current pain regimen UDS due at next visit, will re-sign pain contract at next visit Follow-up in 3 months  Refill of pain medication given today-we will be due soon since she only received half of a prescription with her last refill

## 2017-10-08 NOTE — Telephone Encounter (Signed)
Insurance will be verified for prolia---I will call patient back to discuss summary of benefits

## 2017-10-08 NOTE — Patient Instructions (Addendum)
Take calcium 600 mg twice a day and 2000 units    Medications reviewed and updated.  Changes include stopping your BP medication and starting just telmisartan.  Monitor your BP.  Your prescription(s) have been submitted to your pharmacy. Please take as directed and contact our office if you believe you are having problem(s) with the medication(s).   Please followup in 3 months

## 2017-10-09 ENCOUNTER — Other Ambulatory Visit: Payer: Self-pay | Admitting: Emergency Medicine

## 2017-10-09 MED ORDER — DULOXETINE HCL 20 MG PO CPEP: 20.0000 mg | ORAL_CAPSULE | Freq: Every day | ORAL | 0 refills | Status: DC

## 2017-10-20 DIAGNOSIS — R69 Illness, unspecified: Secondary | ICD-10-CM | POA: Diagnosis not present

## 2017-11-02 ENCOUNTER — Other Ambulatory Visit: Payer: Self-pay | Admitting: Internal Medicine

## 2017-11-02 DIAGNOSIS — Z1231 Encounter for screening mammogram for malignant neoplasm of breast: Secondary | ICD-10-CM

## 2017-11-10 ENCOUNTER — Ambulatory Visit
Admission: RE | Admit: 2017-11-10 | Discharge: 2017-11-10 | Disposition: A | Discharge status: Home / Self Care | Payer: BC Managed Care – PPO | Source: Ambulatory Visit | Attending: Internal Medicine | Admitting: Internal Medicine

## 2017-11-10 DIAGNOSIS — Z1231 Encounter for screening mammogram for malignant neoplasm of breast: Secondary | ICD-10-CM

## 2017-11-21 DIAGNOSIS — R69 Illness, unspecified: Secondary | ICD-10-CM | POA: Diagnosis not present

## 2017-11-26 ENCOUNTER — Other Ambulatory Visit: Payer: Self-pay | Admitting: Internal Medicine

## 2017-11-26 DIAGNOSIS — G8929 Other chronic pain: Secondary | ICD-10-CM

## 2017-11-26 DIAGNOSIS — M5440 Lumbago with sciatica, unspecified side: Principal | ICD-10-CM

## 2017-11-26 NOTE — Addendum Note (Signed)
Addended by: Dimple Nanas on: 11/26/2017 12:41 PM   Modules accepted: Orders

## 2017-11-26 NOTE — Telephone Encounter (Signed)
Hydrocodone refill Last Refill:10/08/17 #120 Last OV: 10/08/17 PCP: Dr. Quay Burow [facility] [address]

## 2017-11-26 NOTE — Telephone Encounter (Signed)
Copied from [place] [phone]. Topic: Quick Communication - Rx Refill/Question >> Nov 26, 2017 10:57 AM Burchel, Abbi R wrote: Medication: HYDROcodone-acetaminophen ([place]) 10-325 MG tablet  [facility] [place] [place] - [address] [place] [address] Phone: [phone] Fax: [phone]  Pt was advised that RX refills may take up to 3 business days. We ask that you follow-up with your pharmacy.

## 2017-11-27 MED ORDER — HYDROCODONE-ACETAMINOPHEN 10-325 MG PO TABS: 2.0000 | ORAL_TABLET | Freq: Two times a day (BID) | ORAL | 0 refills | Status: DC

## 2017-12-13 ENCOUNTER — Other Ambulatory Visit: Payer: Self-pay | Admitting: Internal Medicine

## 2017-12-14 ENCOUNTER — Other Ambulatory Visit: Payer: Self-pay | Admitting: Internal Medicine

## 2017-12-22 ENCOUNTER — Telehealth: Payer: Self-pay

## 2017-12-22 NOTE — Telephone Encounter (Signed)
Per patient's insurance--they are refusing to cover prolia injections until patient has tried a biphosphonate---I don't think i'm seeing any history of biphosphonate usage in past---please advise if you want patient to begin this and which one---I can call patient to discuss, routing to dr burns, thanks

## 2017-12-22 NOTE — Telephone Encounter (Signed)
Yes she should try this -- I would recommend fosamax once a week.  Thanks.

## 2017-12-23 NOTE — Telephone Encounter (Signed)
Patient would like to talk further with dr burns about her options, she has office visit already scheduled in November/2019

## 2018-01-06 NOTE — Progress Notes (Signed)
Subjective:    Patient ID: Tonya [name], female    DOB: May 05, 1940, 77 y.o.   MRN: [id number]  HPI The patient is here for follow up for chronic pain management.  Indication for chronic opioid: chronic loer back pain with intermittent right leg pain, chronic neck pain Medication and dose: norco 10-325  1-2 tabs BID prn # pills per month: 120 Last UDS date: 01/07/18 Pain contract signed (Y/N): Y, resigned 01/07/2018 Date narcotic database last reviewed (include red flags): 01/07/18, no red flags  Pain assessment:  Pain intensity: 5/10  Amount of pain relief with medication:   Significant relief Use of pain medications: takes as prescribed Side effects: none Sleep:  Fair - except for dog Mood: some depression - due to low energy; anxiety controlled Functional/social activities: continues to be active in home setting   Last took prescribed pain medication:  Alcohol use:   rare Tobacco use:    none Street Drug use:    none  . SOB:  She gets out of breath going up stairs.  This has been going on for a while.  The stairs have gotten worse, but not walking on a flat surface.    Right shoulder pain:  Last week it was worse, but it is better this week.  She denies any known injury.  The shoulder hurts with laying on it.   Blood with a bowel movement:  She has seen blood in her stool 10 times over the past two weeks.  She denies constipation.  She feels the stool is too big when it comes out.  She had a colonoscopy 09/2016.  She has hemorrhoids.  She does have pain on the outside that feels irritated.    Hypertension: She is taking her medication daily. She is compliant with a low sodium diet.  She denies chest pain, palpitations, edema, and regular headaches.     Prediabetes:  She is compliant with a low sugar/carbohydrate diet.  She is not exercising regularly.  Hypothyroidism:  She is taking her medication daily.  She denies any recent changes in energy or weight that are  unexplained.    Medications and allergies reviewed with patient and updated if appropriate.  Patient Active Problem List   Diagnosis Date Noted  . Osteoarthritis of hand 07/05/2017  . Gross hematuria 10/15/2016  . Prediabetes 09/21/2016  . Encounter for chronic pain management 05/27/2016  . Fatigue 01/15/2016  . Chronic lower back pain 01/15/2016  . Osteopenia, high FRAX 01/14/2016  . DDD (degenerative disc disease), cervical S/P Decompressive laminectomy and Fusion 11/15/2012  . Asthma 11/15/2012  . Hypothyroidism 11/15/2012  . Depression 11/15/2012  . Constipation 11/15/2012  . Overactive bladder 11/15/2012  . Anxiety 11/15/2012  . Cervical spinal stenosis 10/24/2012  . Fracture of cervical vertebra ([place]) 08/31/2012  . Scoliosis 07/05/2012  . Degeneration of intervertebral disc of lumbosacral region 07/04/2012  . HYPERTENSION, BENIGN 04/21/2007    Current Outpatient Medications on File Prior to Visit  Medication Sig Dispense Refill  . Apoaequorin (PREVAGEN PO) Take by mouth.    . cycloSPORINE (RESTASIS) 0.05 % ophthalmic emulsion Place 1 drop into both eyes 2 (two) times daily.    . DULoxetine (CYMBALTA) 20 MG capsule Take 1 capsule (20 mg total) by mouth daily. 90 capsule 0  . fish oil-omega-3 fatty acids 1000 MG capsule Take 2 g by mouth daily.    Marland Kitchen gabapentin (NEURONTIN) 300 MG capsule Take 1 capsule (300 mg total) by mouth daily as  needed (pain). 30 capsule 0  . HYDROcodone-acetaminophen (NORCO) 10-325 MG tablet Take 2 tablets by mouth 2 (two) times daily. For chronic lower back and neck pain 120 tablet 0  . levalbuterol (XOPENEX HFA) 45 MCG/ACT inhaler Inhale 1-2 puffs into the lungs every 4 (four) hours as needed for wheezing. 1 Inhaler 8  . Magnesium 125 MG CAPS Take 150 mg by mouth 2 (two) times daily.    . multivitamin-lutein (OCUVITE-LUTEIN) CAPS capsule Take by mouth daily.     . SYMBICORT 80-4.5 MCG/ACT inhaler TAKE 2 PUFFS BY MOUTH TWICE A DAY 10.2 Inhaler 5  .  SYNTHROID 75 MCG tablet TAKE 1 TABLET BY MOUTH EVERY DAY 90 tablet 0  . telmisartan (MICARDIS) 40 MG tablet Take 1 tablet (40 mg total) by mouth daily. 90 tablet 1  . VESICARE 10 MG tablet TAKE 1 TABLET BY MOUTH EVERY DAY 90 tablet 1  . vitamin C (ASCORBIC ACID) 500 MG tablet Take 500 mg by mouth daily.     No current facility-administered medications on file prior to visit.     Past Medical History:  Diagnosis Date  . Anemia   . Asthma   . Back pain, chronic   . Diverticulosis   . H/O arthrodesis 11/22/2012  . History of hysterectomy   . Hypertension   . Spinal stenosis   . Synovial cyst     Past Surgical History:  Procedure Laterality Date  . ABDOMINAL HYSTERECTOMY    . BACK SURGERY    . CYST REMOVAL TRUNK      Social History   Socioeconomic History  . Marital status: Widowed    Spouse name: Not on file  . Number of children: Not on file  . Years of education: Not on file  . Highest education level: Not on file  Occupational History  . Not on file  Social Needs  . Financial resource strain: Not on file  . Food insecurity:    Worry: Not on file    Inability: Not on file  . Transportation needs:    Medical: Not on file    Non-medical: Not on file  Tobacco Use  . Smoking status: Never Smoker  . Smokeless tobacco: Never Used  Substance and Sexual Activity  . Alcohol use: Yes    Comment: socially  . Drug use: No  . Sexual activity: Not on file  Lifestyle  . Physical activity:    Days per week: Not on file    Minutes per session: Not on file  . Stress: Not on file  Relationships  . Social connections:    Talks on phone: Not on file    Gets together: Not on file    Attends religious service: Not on file    Active member of club or organization: Not on file    Attends meetings of clubs or organizations: Not on file    Relationship status: Not on file  Other Topics Concern  . Not on file  Social History Narrative   Not currently exercising    Family  History  Problem Relation Age of Onset  . Other Father        carotid artery disease  . Heart disease Brother   . Alcohol abuse Brother   . Breast cancer Neg Hx     Review of Systems  Constitutional: Negative for chills and fever.  Respiratory: Positive for shortness of breath. Negative for cough and wheezing.   Cardiovascular: Negative for chest pain, palpitations and leg  swelling.  Musculoskeletal: Positive for arthralgias (R shoulder, fingers) and back pain.  Neurological: Negative for headaches.       Objective:   Vitals:   01/07/18 0949  BP: 138/80  Pulse: 75  Resp: 16  Temp: 98.2 F (36.8 C)  SpO2: 98%   BP Readings from Last 3 Encounters:  01/07/18 138/80  10/08/17 138/70  07/26/17 138/70   Wt Readings from Last 3 Encounters:  01/07/18 106 lb (48.1 kg)  10/08/17 102 lb (46.3 kg)  07/26/17 104 lb 1.9 oz (47.2 kg)   Body mass index is 23.14 kg/m.   Physical Exam    Constitutional: Appears well-developed and well-nourished. No distress.  HENT:  Head: Normocephalic and atraumatic.  Neck: Neck supple. No tracheal deviation present. No thyromegaly present.  No cervical lymphadenopathy Cardiovascular: Normal rate, regular rhythm and normal heart sounds.   1/6 systolic murmur heard. No carotid bruit .  No edema Pulmonary/Chest: Effort normal and breath sounds normal. No respiratory distress. No has no wheezes. No rales.  Skin: Skin is warm and dry. Not diaphoretic.  Psychiatric: Normal mood and affect. Behavior is normal.      Assessment & Plan:    See Problem List for Assessment and Plan of chronic medical problems.

## 2018-01-07 ENCOUNTER — Ambulatory Visit: Payer: BC Managed Care – PPO | Admitting: Internal Medicine

## 2018-01-07 ENCOUNTER — Encounter: Payer: Self-pay | Admitting: Internal Medicine

## 2018-01-07 ENCOUNTER — Other Ambulatory Visit (INDEPENDENT_AMBULATORY_CARE_PROVIDER_SITE_OTHER): Payer: BC Managed Care – PPO

## 2018-01-07 ENCOUNTER — Other Ambulatory Visit: Payer: Self-pay | Admitting: Internal Medicine

## 2018-01-07 ENCOUNTER — Telehealth: Payer: Self-pay | Admitting: Internal Medicine

## 2018-01-07 VITALS — BP 138/80 | HR 75 | Temp 98.2°F | Resp 16 | Ht <= 58 in | Wt 106.0 lb

## 2018-01-07 DIAGNOSIS — M5440 Lumbago with sciatica, unspecified side: Secondary | ICD-10-CM | POA: Diagnosis not present

## 2018-01-07 DIAGNOSIS — R7303 Prediabetes: Secondary | ICD-10-CM

## 2018-01-07 DIAGNOSIS — K625 Hemorrhage of anus and rectum: Secondary | ICD-10-CM | POA: Insufficient documentation

## 2018-01-07 DIAGNOSIS — I10 Essential (primary) hypertension: Secondary | ICD-10-CM

## 2018-01-07 DIAGNOSIS — R0600 Dyspnea, unspecified: Secondary | ICD-10-CM

## 2018-01-07 DIAGNOSIS — G8929 Other chronic pain: Secondary | ICD-10-CM

## 2018-01-07 DIAGNOSIS — Z0289 Encounter for other administrative examinations: Secondary | ICD-10-CM

## 2018-01-07 DIAGNOSIS — M25511 Pain in right shoulder: Secondary | ICD-10-CM

## 2018-01-07 DIAGNOSIS — E039 Hypothyroidism, unspecified: Secondary | ICD-10-CM

## 2018-01-07 DIAGNOSIS — R0609 Other forms of dyspnea: Secondary | ICD-10-CM

## 2018-01-07 LAB — CBC WITH DIFFERENTIAL/PLATELET
Basophils Absolute: 0.1 10*3/uL (ref 0.0–0.1)
Basophils Relative: 0.9 % (ref 0.0–3.0)
EOS PCT: 2.1 % (ref 0.0–5.0)
Eosinophils Absolute: 0.2 10*3/uL (ref 0.0–0.7)
HEMATOCRIT: 35.9 % — AB (ref 36.0–46.0)
HEMOGLOBIN: 12.1 g/dL (ref 12.0–15.0)
LYMPHS ABS: 1.5 10*3/uL (ref 0.7–4.0)
LYMPHS PCT: 19.4 % (ref 12.0–46.0)
MCHC: 33.7 g/dL (ref 30.0–36.0)
MCV: 93.5 fl (ref 78.0–100.0)
MONOS PCT: 10.1 % (ref 3.0–12.0)
Monocytes Absolute: 0.8 10*3/uL (ref 0.1–1.0)
Neutro Abs: 5.1 10*3/uL (ref 1.4–7.7)
Neutrophils Relative %: 67.5 % (ref 43.0–77.0)
Platelets: 225 10*3/uL (ref 150.0–400.0)
RBC: 3.83 Mil/uL — AB (ref 3.87–5.11)
RDW: 13.5 % (ref 11.5–15.5)
WBC: 7.6 10*3/uL (ref 4.0–10.5)

## 2018-01-07 LAB — LIPID PANEL
Cholesterol: 194 mg/dL (ref 0–200)
HDL: 94.4 mg/dL (ref >39.00)
LDL Cholesterol: 91 mg/dL (ref 0–99)
NONHDL: 99.81
Total CHOL/HDL Ratio: 2
Triglycerides: 46 mg/dL (ref 0.0–149.0)
VLDL: 9.2 mg/dL (ref 0.0–40.0)

## 2018-01-07 LAB — COMPREHENSIVE METABOLIC PANEL
ALK PHOS: 61 U/L (ref 39–117)
ALT: 13 U/L (ref 0–35)
AST: 17 U/L (ref 0–37)
Albumin: 4.3 g/dL (ref 3.5–5.2)
BUN: 25 mg/dL — ABNORMAL HIGH (ref 6–23)
CO2: 28 mEq/L (ref 19–32)
Calcium: 9.5 mg/dL (ref 8.4–10.5)
Chloride: 103 mEq/L (ref 96–112)
Creatinine, Ser: 0.93 mg/dL (ref 0.40–1.20)
GFR: 62.29 mL/min (ref >60.00)
Glucose, Bld: 99 mg/dL (ref 70–99)
POTASSIUM: 4.6 meq/L (ref 3.5–5.1)
Sodium: 138 mEq/L (ref 135–145)
TOTAL PROTEIN: 6.9 g/dL (ref 6.0–8.3)
Total Bilirubin: 0.4 mg/dL (ref 0.2–1.2)

## 2018-01-07 LAB — TSH: TSH: 1.68 u[IU]/mL (ref 0.35–4.50)

## 2018-01-07 LAB — HEMOGLOBIN A1C: Hgb A1c MFr Bld: 5.9 % (ref 4.6–6.5)

## 2018-01-07 MED ORDER — FESOTERODINE FUMARATE ER 8 MG PO TB24: ORAL_TABLET | ORAL | 0 refills | Status: DC

## 2018-01-07 MED ORDER — HYDROCODONE-ACETAMINOPHEN 10-325 MG PO TABS: 2.0000 | ORAL_TABLET | Freq: Two times a day (BID) | ORAL | 0 refills | Status: DC

## 2018-01-07 NOTE — Telephone Encounter (Signed)
Rx resent.

## 2018-01-07 NOTE — Assessment & Plan Note (Addendum)
colonoscopy done 7/18, and has moderate internal hemorrhoids so bleeding is likely hemorrhoidal in nature She will monitor for now Avoid constipation - she may have some mild constipation Deferred prescription at this time cbc Call if no improvement

## 2018-01-07 NOTE — Assessment & Plan Note (Signed)
Indication for chronic opioid: chronic loer back pain with intermittent right leg pain, chronic neck pain Medication and dose: norco 10-325  1-2 tabs BID prn - effective and tolerated well Taking medication appropriately - no signs of abuse UDS today New pain contract signed  narcotic database reviewed today, refilled today  Fu in 3 months

## 2018-01-07 NOTE — Assessment & Plan Note (Signed)
No injury -pain is better than last week Deferred referral at this time If pain does not continue to improve will refer to sports med

## 2018-01-07 NOTE — Patient Instructions (Addendum)
  Tests ordered today. Your results will be released to MyChart (or called to you) after review, usually within 72hours after test completion. If any changes need to be made, you will be notified at that same time.   Medications reviewed and updated.  Changes include :   none  Your prescription(s) have been submitted to your pharmacy. Please take as directed and contact our office if you believe you are having problem(s) with the medication(s).   Please followup in 3 months   

## 2018-01-07 NOTE — Assessment & Plan Note (Signed)
Taking pain medication appropriately - continue current dose of pain medication norco 10-325 mg 3-4 per day F/u in 3 months

## 2018-01-07 NOTE — Assessment & Plan Note (Signed)
Check a1c Low sugar / carb diet Stressed regular exercise   

## 2018-01-07 NOTE — Assessment & Plan Note (Signed)
Clinically euthyroid Check tsh  Titrate med dose if needed  

## 2018-01-07 NOTE — Assessment & Plan Note (Signed)
Chronic but SOB with stairs has gotten worse  She is not exercising and deconditioning is likely contributing - increase exercise Deferred cardiology referral at this time - has had evaluation in the past

## 2018-01-07 NOTE — Telephone Encounter (Signed)
Copied from [place] [id number]. Topic: General - Other >> Jan 07, 2018  1:28 PM Wynetta Emery, [place] C wrote: Reason for CRM: pharmacy called in to be advised. They received Rx for TOVIAZ 8 MG TB24 tablet, Rx was sent with out Quantity and directions.   Please advise.    CB: [phone] Denny Peon

## 2018-01-09 ENCOUNTER — Encounter: Payer: Self-pay | Admitting: Internal Medicine

## 2018-01-10 LAB — PAIN MGMT, PROFILE 8 W/CONF, U
6 Acetylmorphine: NEGATIVE ng/mL (ref <10)
ALCOHOL METABOLITES: NEGATIVE ng/mL (ref <500)
Amphetamines: NEGATIVE ng/mL (ref <500)
BENZODIAZEPINES: NEGATIVE ng/mL (ref <100)
BUPRENORPHINE, URINE: NEGATIVE ng/mL (ref <5)
COCAINE METABOLITE: NEGATIVE ng/mL (ref <150)
CREATININE: 75.3 mg/dL
Codeine: NEGATIVE ng/mL (ref <50)
Hydrocodone: 2550 ng/mL — ABNORMAL HIGH (ref <50)
Hydromorphone: 431 ng/mL — ABNORMAL HIGH (ref <50)
MDMA: NEGATIVE ng/mL (ref <500)
MORPHINE: NEGATIVE ng/mL (ref <50)
Marijuana Metabolite: NEGATIVE ng/mL (ref <20)
NORHYDROCODONE: 1844 ng/mL — AB (ref <50)
Opiates: POSITIVE ng/mL — AB (ref <100)
Oxidant: NEGATIVE ug/mL (ref <200)
Oxycodone: NEGATIVE ng/mL (ref <100)
pH: 6.01 (ref 4.5–9.0)

## 2018-01-17 ENCOUNTER — Other Ambulatory Visit: Payer: Self-pay | Admitting: Internal Medicine

## 2018-01-17 MED ORDER — VESICARE 10 MG PO TABS: 10.0000 mg | ORAL_TABLET | Freq: Every day | ORAL | 1 refills | Status: DC

## 2018-02-16 ENCOUNTER — Other Ambulatory Visit: Payer: Self-pay | Admitting: Internal Medicine

## 2018-02-16 DIAGNOSIS — M5440 Lumbago with sciatica, unspecified side: Principal | ICD-10-CM

## 2018-02-16 DIAGNOSIS — G8929 Other chronic pain: Secondary | ICD-10-CM

## 2018-02-16 MED ORDER — HYDROCODONE-ACETAMINOPHEN 10-325 MG PO TABS: 2.0000 | ORAL_TABLET | Freq: Two times a day (BID) | ORAL | 0 refills | Status: DC

## 2018-02-16 NOTE — Telephone Encounter (Signed)
Last refill was 01/07/18 Last OV was 01/07/18 Next OV is 04/11/18

## 2018-02-16 NOTE — Telephone Encounter (Signed)
Copied from [place] [id number]. Topic: Quick Communication - Rx Refill/Question >> Feb 16, 2018  8:56 AM Alanda Slim E wrote: Medication: HYDROcodone-acetaminophen (NORCO) 10-325 MG tablet  Has the patient contacted their pharmacy? no    Preferred Pharmacy (with phone number or street name): [facility] [place] [address] [phone] (Phone) [phone] (Fax)    Agent: Please be advised that RX refills may take up to 3 business days. We ask that you follow-up with your pharmacy.

## 2018-02-16 NOTE — Telephone Encounter (Signed)
Requested medication (s) are due for refill today -yes  Requested medication (s) are on the active medication list -yes  Future visit scheduled -yes  Last refill: 01/17/18  Notes to clinic: Patient is requesting non delegated Rx- sent for provider review   Requested Prescriptions  Pending Prescriptions Disp Refills   HYDROcodone-acetaminophen (NORCO) 10-325 MG tablet 120 tablet 0    Sig: Take 2 tablets by mouth 2 (two) times daily. For chronic lower back and neck pain     Not Delegated - Analgesics:  Opioid Agonist Combinations Failed - 02/16/2018  9:00 AM      Failed - This refill cannot be delegated      Failed - Urine Drug Screen completed in last 360 days.      Passed - Valid encounter within last 6 months    Recent Outpatient Visits          1 month ago Encounter for chronic pain management   [place], Claudina Lick, MD   4 months ago Acquired hypothyroidism   [place], Claudina Lick, MD   6 months ago Nausea   [place], Marvis Repress, [place]   7 months ago Encounter for chronic pain management   [place], Claudina Lick, MD   10 months ago Encounter for chronic pain management   [place], MD      Future Appointments            In 1 month [place], Claudina Lick, MD [facility], [facility]            Requested Prescriptions  Pending Prescriptions Disp Refills   HYDROcodone-acetaminophen (NORCO) 10-325 MG tablet 120 tablet 0    Sig: Take 2 tablets by mouth 2 (two) times daily. For chronic lower back and neck pain     Not Delegated - Analgesics:  Opioid Agonist Combinations Failed - 02/16/2018  9:00 AM      Failed - This refill cannot be delegated      Failed - Urine Drug Screen completed in last 360 days.      Passed - Valid encounter within last 6 months    Recent Outpatient Visits           1 month ago Encounter for chronic pain management   [place], Claudina Lick, MD   4 months ago Acquired hypothyroidism   [place], MD   6 months ago Nausea   [place], Marvis Repress, [place]   7 months ago Encounter for chronic pain management   [place], Claudina Lick, MD   10 months ago Encounter for chronic pain management   [place], MD      Future Appointments            In 1 month Burns, Claudina Lick, MD [place], [facility]

## 2018-02-19 ENCOUNTER — Other Ambulatory Visit: Payer: Self-pay | Admitting: Internal Medicine

## 2018-02-26 ENCOUNTER — Other Ambulatory Visit: Payer: Self-pay | Admitting: Internal Medicine

## 2018-03-14 ENCOUNTER — Other Ambulatory Visit: Payer: Self-pay | Admitting: Internal Medicine

## 2018-03-25 ENCOUNTER — Telehealth: Payer: Self-pay | Admitting: Internal Medicine

## 2018-03-25 DIAGNOSIS — M5440 Lumbago with sciatica, unspecified side: Principal | ICD-10-CM

## 2018-03-25 DIAGNOSIS — G8929 Other chronic pain: Secondary | ICD-10-CM

## 2018-03-25 MED ORDER — HYDROCODONE-ACETAMINOPHEN 10-325 MG PO TABS: 2.0000 | ORAL_TABLET | Freq: Two times a day (BID) | ORAL | 0 refills | Status: DC

## 2018-03-25 NOTE — Telephone Encounter (Signed)
Medication not delegated for NT to refill. 

## 2018-03-25 NOTE — Addendum Note (Signed)
Addended by: [name] on: 03/25/2018 04:33 PM   Modules accepted: Orders

## 2018-03-25 NOTE — Telephone Encounter (Signed)
Copied from [place] [id number]. Topic: Quick Communication - Rx Refill/Question >> Mar 25, 2018  1:23 PM Judyann Munson wrote: Medication: HYDROcodone-acetaminophen ([place]) 10-325 MG tablet Has the patient contacted their pharmacy?no   Preferred Pharmacy (with phone number or street name): [facility] [place] [facility], [place] [address] [phone] (Phone) [phone] (Fax)    Agent: Please be advised that RX refills may take up to 3 business days. We ask that you follow-up with your pharmacy.

## 2018-03-25 NOTE — Telephone Encounter (Signed)
Last refill was 02/16/18 Last OV was 01/07/2018 Next OV is 04/11/18

## 2018-03-28 DIAGNOSIS — H35071 Retinal telangiectasis, right eye: Secondary | ICD-10-CM | POA: Diagnosis not present

## 2018-03-28 DIAGNOSIS — H353131 Nonexudative age-related macular degeneration, bilateral, early dry stage: Secondary | ICD-10-CM | POA: Diagnosis not present

## 2018-03-28 DIAGNOSIS — R0683 Snoring: Secondary | ICD-10-CM | POA: Diagnosis not present

## 2018-03-28 DIAGNOSIS — H3589 Other specified retinal disorders: Secondary | ICD-10-CM | POA: Diagnosis not present

## 2018-04-10 NOTE — Progress Notes (Signed)
Subjective:    Patient ID: CAROLEEN STOERMER, female    DOB: Jul 14, 1940, 78 y.o.   MRN: [id number]  HPI  The patient is here for follow up for chronic pain management.  Indication for chronic opioid: chronic lower back pain, chronic neck pain Medication and dose: norco 10-325 mg   1-2 tabs BID prn # pills per month: 120 Last UDS date: 01/07/2018 Pain contract signed (Y/N): 01/07/2018 Date narcotic database last reviewed (include red flags):04/11/2018 - last filled 03/25/2018, no red flags  Pain assessment:  Her back pain has been a little more but she is doing more so it makes sense.  Pain intensity: 6 /10 at most Amount of pain relief with medication: good relief Use of pain medications:  Taking medications as prescribed 3-4/day Side effects:    none Sleep: interrupted by dog Mood:  Denies depression, anxiety - taking cymbalta Functional/social activities: continues to be active in home setting   Last took prescribed pain medication:  Alcohol use:  rare Tobacco use:    none Street Drug use:    no   Her BP is higher since stopping the hctz - she is urinating less, which is 1 of the reasons why we discontinued it.  She is taking the telmisartan daily.  She denies chest pain, palpitation, leg swelling, shortness of breath, headaches and lightheadedness   Medications and allergies reviewed with patient and updated if appropriate.  Patient Active Problem List   Diagnosis Date Noted  . DOE (dyspnea on exertion) 01/07/2018  . BRBPR (bright red blood per rectum) 01/07/2018  . Acute pain of right shoulder 01/07/2018  . Pain management contract signed, 01/07/18 01/07/2018  . Osteoarthritis of hand 07/05/2017  . Gross hematuria 10/15/2016  . Prediabetes 09/21/2016  . Encounter for chronic pain management 05/27/2016  . Fatigue 01/15/2016  . Chronic lower back pain 01/15/2016  . Osteopenia, high FRAX 01/14/2016  . DDD (degenerative disc disease), cervical S/P Decompressive  laminectomy and Fusion 11/15/2012  . Asthma 11/15/2012  . Hypothyroidism 11/15/2012  . Depression 11/15/2012  . Constipation 11/15/2012  . Overactive bladder 11/15/2012  . Anxiety 11/15/2012  . Cervical spinal stenosis 10/24/2012  . Fracture of cervical vertebra ([place]) 08/31/2012  . Scoliosis 07/05/2012  . Degeneration of intervertebral disc of lumbosacral region 07/04/2012  . HYPERTENSION, BENIGN 04/21/2007    Current Outpatient Medications on File Prior to Visit  Medication Sig Dispense Refill  . Apoaequorin (PREVAGEN PO) Take by mouth.    . cycloSPORINE (RESTASIS) 0.05 % ophthalmic emulsion Place 1 drop into both eyes 2 (two) times daily.    . DULoxetine (CYMBALTA) 20 MG capsule Take 1 capsule (20 mg total) by mouth daily. 90 capsule 0  . fish oil-omega-3 fatty acids 1000 MG capsule Take 2 g by mouth daily.    Marland Kitchen gabapentin (NEURONTIN) 300 MG capsule Take 1 capsule (300 mg total) by mouth daily as needed (pain). 30 capsule 0  . HYDROcodone-acetaminophen (NORCO) 10-325 MG tablet Take 2 tablets by mouth 2 (two) times daily. For chronic lower back and neck pain 120 tablet 0  . levalbuterol (XOPENEX HFA) 45 MCG/ACT inhaler Inhale 1-2 puffs into the lungs every 4 (four) hours as needed for wheezing. 1 Inhaler 8  . Magnesium 125 MG CAPS Take 150 mg by mouth 2 (two) times daily.    . multivitamin-lutein (OCUVITE-LUTEIN) CAPS capsule Take by mouth daily.     . SYMBICORT 80-4.5 MCG/ACT inhaler TAKE 2 PUFFS BY MOUTH TWICE A DAY 30.6  Inhaler 1  . SYNTHROID 75 MCG tablet TAKE 1 TABLET BY MOUTH EVERY DAY 90 tablet 0  . telmisartan (MICARDIS) 40 MG tablet TAKE 1 TABLET BY MOUTH EVERY DAY 90 tablet 1  . VESICARE 10 MG tablet Take 1 tablet (10 mg total) by mouth daily. 90 tablet 1  . vitamin C (ASCORBIC ACID) 500 MG tablet Take 500 mg by mouth daily.     No current facility-administered medications on file prior to visit.     Past Medical History:  Diagnosis Date  . Anemia   . Asthma   . Back  pain, chronic   . Diverticulosis   . H/O arthrodesis 11/22/2012  . History of hysterectomy   . Hypertension   . Spinal stenosis   . Synovial cyst     Past Surgical History:  Procedure Laterality Date  . ABDOMINAL HYSTERECTOMY    . BACK SURGERY    . CYST REMOVAL TRUNK      Social History   Socioeconomic History  . Marital status: Widowed    Spouse name: Not on file  . Number of children: Not on file  . Years of education: Not on file  . Highest education level: Not on file  Occupational History  . Not on file  Social Needs  . Financial resource strain: Not on file  . Food insecurity:    Worry: Not on file    Inability: Not on file  . Transportation needs:    Medical: Not on file    Non-medical: Not on file  Tobacco Use  . Smoking status: Never Smoker  . Smokeless tobacco: Never Used  Substance and Sexual Activity  . Alcohol use: Yes    Comment: socially  . Drug use: No  . Sexual activity: Not on file  Lifestyle  . Physical activity:    Days per week: Not on file    Minutes per session: Not on file  . Stress: Not on file  Relationships  . Social connections:    Talks on phone: Not on file    Gets together: Not on file    Attends religious service: Not on file    Active member of club or organization: Not on file    Attends meetings of clubs or organizations: Not on file    Relationship status: Not on file  Other Topics Concern  . Not on file  Social History Narrative   Not currently exercising    Family History  Problem Relation Age of Onset  . Other Father        carotid artery disease  . Heart disease Brother   . Alcohol abuse Brother   . Breast cancer Neg Hx     Review of Systems  Respiratory: Negative for shortness of breath.   Cardiovascular: Negative for chest pain, palpitations and leg swelling.  Musculoskeletal: Positive for back pain and neck pain.  Neurological: Negative for light-headedness and headaches.       Objective:    Vitals:   04/11/18 1418  BP: (!) 164/80  Pulse: 75  Resp: 16  Temp: 98.2 F (36.8 C)  SpO2: 95%   BP Readings from Last 3 Encounters:  04/11/18 (!) 164/80  01/07/18 138/80  10/08/17 138/70   Wt Readings from Last 3 Encounters:  04/11/18 105 lb 6.4 oz (47.8 kg)  01/07/18 106 lb (48.1 kg)  10/08/17 102 lb (46.3 kg)   Body mass index is 23.01 kg/m.   Physical Exam    Constitutional: Appears  well-developed and well-nourished. No distress.  HENT:  Head: Normocephalic and atraumatic.  Neck: Neck supple. No tracheal deviation present. No thyromegaly present.  No cervical lymphadenopathy Cardiovascular: Normal rate, regular rhythm and normal heart sounds.  No murmur heard.  No edema Pulmonary/Chest: Effort normal and breath sounds normal. No respiratory distress. No has no wheezes. No rales.  Skin: Skin is warm and dry. Not diaphoretic.      Assessment & Plan:    See Problem List for Assessment and Plan of chronic medical problems.

## 2018-04-10 NOTE — Patient Instructions (Addendum)
   Medications reviewed and updated.  Changes include :   Start amlodipine for your BP.  Your prescription(s) have been submitted to your pharmacy. Please take as directed and contact our office if you believe you are having problem(s) with the medication(s).   Please followup in 3 months for pain management and CPE anytime.

## 2018-04-11 ENCOUNTER — Encounter: Payer: Self-pay | Admitting: Internal Medicine

## 2018-04-11 ENCOUNTER — Ambulatory Visit: Payer: BC Managed Care – PPO | Admitting: Internal Medicine

## 2018-04-11 VITALS — BP 164/80 | HR 75 | Temp 98.2°F | Resp 16 | Ht <= 58 in | Wt 105.4 lb

## 2018-04-11 DIAGNOSIS — M545 Low back pain, unspecified: Secondary | ICD-10-CM

## 2018-04-11 DIAGNOSIS — M4802 Spinal stenosis, cervical region: Secondary | ICD-10-CM | POA: Diagnosis not present

## 2018-04-11 DIAGNOSIS — G8929 Other chronic pain: Secondary | ICD-10-CM | POA: Diagnosis not present

## 2018-04-11 DIAGNOSIS — I10 Essential (primary) hypertension: Secondary | ICD-10-CM | POA: Diagnosis not present

## 2018-04-11 MED ORDER — AMLODIPINE BESYLATE 5 MG PO TABS: 5.0000 mg | ORAL_TABLET | Freq: Every day | ORAL | 5 refills | Status: DC

## 2018-04-11 MED ORDER — BUDESONIDE-FORMOTEROL FUMARATE 80-4.5 MCG/ACT IN AERO: INHALATION_SPRAY | RESPIRATORY_TRACT | 5 refills | Status: DC

## 2018-04-11 MED ORDER — LEVALBUTEROL TARTRATE 45 MCG/ACT IN AERO: 1.0000 | INHALATION_SPRAY | RESPIRATORY_TRACT | 8 refills | Status: DC | PRN

## 2018-04-11 NOTE — Assessment & Plan Note (Signed)
Overall pain controlled and taking pain medication appropriately Continue current dose of Norco Follow-up in 3 months

## 2018-04-11 NOTE — Assessment & Plan Note (Signed)
Slight increase in chronic lower back pain related to doing more yard work Overall pain controlled and taking pain medication appropriately Continue current dose of Norco Follow-up in 3 months

## 2018-04-11 NOTE — Assessment & Plan Note (Addendum)
BP high Continue telmisartan Will add amlodipine 5 mg daily since we would like to avoid hydrochlorothiazide because it increases urination and cause mild decreased kidney function F/u in 3 months

## 2018-04-11 NOTE — Assessment & Plan Note (Signed)
Chronic pain management for chronic lower back pain intermittent with chronic neck pain Taking Norco 10-325 mg 1-2 tabs twice daily-sometimes only takes 1 tab at night Has had some increased pain because she has been more active with yard work, but overall pain is controlled Taking medications appropriately without signs of aberrant use Pain contract and urine drug screen up-to-date [place] controlled substance database reviewed today-refill not needed Continue current pain regimen follow-up in 3 months

## 2018-04-17 ENCOUNTER — Other Ambulatory Visit: Payer: Self-pay | Admitting: Internal Medicine

## 2018-04-22 ENCOUNTER — Other Ambulatory Visit: Payer: Self-pay | Admitting: Internal Medicine

## 2018-05-06 ENCOUNTER — Other Ambulatory Visit: Payer: Self-pay | Admitting: Internal Medicine

## 2018-05-06 DIAGNOSIS — G8929 Other chronic pain: Secondary | ICD-10-CM

## 2018-05-06 DIAGNOSIS — M5440 Lumbago with sciatica, unspecified side: Principal | ICD-10-CM

## 2018-05-06 MED ORDER — HYDROCODONE-ACETAMINOPHEN 10-325 MG PO TABS: 2.0000 | ORAL_TABLET | Freq: Two times a day (BID) | ORAL | 0 refills | Status: DC

## 2018-05-06 NOTE — Telephone Encounter (Signed)
Check [place] registry last filled 03/25/2018.Marland KitchenJohny [name]

## 2018-05-06 NOTE — Telephone Encounter (Signed)
Copied from [place] [phone]. Topic: Quick Communication - Rx Refill/Question >> May 06, 2018 10:22 AM Scherrie Gerlach wrote: Medication: HYDROcodone-acetaminophen ([place]) 10-325 MG tablet  [id number] [place] [place] - [address] [phone] (Phone) [phone] (Fax)

## 2018-05-11 ENCOUNTER — Other Ambulatory Visit: Payer: Self-pay | Admitting: Internal Medicine

## 2018-06-07 ENCOUNTER — Other Ambulatory Visit: Payer: Self-pay | Admitting: Internal Medicine

## 2018-06-08 ENCOUNTER — Encounter: Payer: BC Managed Care – PPO | Admitting: Internal Medicine

## 2018-06-13 ENCOUNTER — Other Ambulatory Visit: Payer: Self-pay | Admitting: Internal Medicine

## 2018-06-13 DIAGNOSIS — G8929 Other chronic pain: Secondary | ICD-10-CM

## 2018-06-13 DIAGNOSIS — M5440 Lumbago with sciatica, unspecified side: Principal | ICD-10-CM

## 2018-06-13 NOTE — Telephone Encounter (Signed)
Please advise on refill request

## 2018-06-14 ENCOUNTER — Other Ambulatory Visit: Payer: Self-pay | Admitting: Internal Medicine

## 2018-06-14 DIAGNOSIS — M5440 Lumbago with sciatica, unspecified side: Principal | ICD-10-CM

## 2018-06-14 DIAGNOSIS — G8929 Other chronic pain: Secondary | ICD-10-CM

## 2018-06-15 MED ORDER — HYDROCODONE-ACETAMINOPHEN 10-325 MG PO TABS: 2.0000 | ORAL_TABLET | Freq: Two times a day (BID) | ORAL | 0 refills | Status: DC

## 2018-06-15 NOTE — Telephone Encounter (Signed)
[place] Controlled Database Checked Last filled: 05/06/18 # 120 LOV w/you: 04/11/18  Next appt w/you: 07/11/18

## 2018-06-15 NOTE — Telephone Encounter (Signed)
Per Database, last refilled Hydrocodone on 05/06/2018.  LOV with PCP was 04/11/2018

## 2018-06-22 ENCOUNTER — Encounter: Payer: BC Managed Care – PPO | Admitting: Internal Medicine

## 2018-07-10 ENCOUNTER — Other Ambulatory Visit: Payer: Self-pay | Admitting: Internal Medicine

## 2018-07-10 NOTE — Progress Notes (Signed)
Virtual Visit via Video Note failed  -Doxy unable to connect to camera --- so visit converted to telephone  I connected with Tonya [name] on 07/11/18 at  2:15 PM EDT by a video enabled telemedicine application and verified that I am speaking with the correct person using two identifiers.   I discussed the limitations of evaluation and management by telemedicine and the availability of in person appointments. The patient expressed understanding and agreed to proceed.  The patient is currently at home and I am in the office.    No referring provider.    History of Present Illness: The patient is here for follow up for chronic pain management and her chronic medical problems.   She has had several Tick bites in the past several days.  The one last week has a persistent small knot where the bite is - she has never had that was wanted to know if that was ok.  She denies any concerning symptoms c/w lyme disease.    Indication for chronic opioid: chronic lower back pain, chronic neck pain Medication and dose: norco 10-325 mg  1-2 tabs BID prn # pills per month: 120  Last UDS date: 01/07/2018 Pain contract signed (Y/N): 01/07/2018 Date narcotic database last reviewed:   07/11/18  - last filled 06/15/2018  Pain assessment:  Pain intensity: moderate - worse with increased yardwork  Amount of pain relief with medication:  Good relief Use of pain medications:   Takes 3-4 pills daily Side effects: none Sleep:  Sleeping more during the day - not sleeping well at night - urinating often and not turning light off before 1-3 am Mood:  Anxiety and depression controlled with cymbalta Functional/social activities: continues to be active in home setting   Last took prescribed pain medication:  This morning Alcohol use:   rare Tobacco use:    no Street Drug use:   No   Depression, anxiety: She is taking her medication daily as prescribed. She denies any side effects from the medication. She  thinks her depression is controlled, but not sure.  She is not getting everything she should.  She has some anxiety, but only regarding the tick situation but no other reason.    Hypertension: She is taking her medication daily. She is compliant with a [name] sodium diet.  She denies chest pain, palpitations, edema, shortness of breath and regular headaches.     Prediabetes:  She is not always compliant with a [name] sugar/carbohydrate diet.  She is exercising regularly.  Hypothyroidism:  She is taking her medication daily.  She denies any recent changes in energy or weight that are unexplained.   Asthma:  She takes the symbicort twice daily.  She takes xopenex prn    Overactive bladder:  She takes [place] daily.      She is sleeping more than she should in the daytime. She is not eating as she should, but has lost weight.  She is walking her dog.      Review of Systems  Constitutional: Negative for chills and fever.  Respiratory: Positive for shortness of breath (with inclines - not new). Negative for cough and wheezing.   Cardiovascular: Negative for chest pain, palpitations and leg swelling.  Genitourinary: Positive for frequency. Negative for dysuria and hematuria.       No urine odor  Neurological: Positive for dizziness (occ). Negative for headaches.      Social History   Socioeconomic History  . Marital status: Widowed  Spouse name: Not on file  . Number of children: Not on file  . Years of education: Not on file  . Highest education level: Not on file  Occupational History  . Not on file  Social Needs  . Financial resource strain: Not on file  . Food insecurity:    Worry: Not on file    Inability: Not on file  . Transportation needs:    Medical: Not on file    Non-medical: Not on file  Tobacco Use  . Smoking status: Never Smoker  . Smokeless tobacco: Never Used  Substance and Sexual Activity  . Alcohol use: Yes    Comment: socially  . Drug use: No  . Sexual  activity: Not on file  Lifestyle  . Physical activity:    Days per week: Not on file    Minutes per session: Not on file  . Stress: Not on file  Relationships  . Social connections:    Talks on phone: Not on file    Gets together: Not on file    Attends religious service: Not on file    Active member of club or organization: Not on file    Attends meetings of clubs or organizations: Not on file    Relationship status: Not on file  Other Topics Concern  . Not on file  Social History Narrative   Not currently exercising     Observations/Objective: Appears well in NAD   Assessment and Plan:  See Problem List for Assessment and Plan of chronic medical problems.   Follow Up Instructions:    I discussed the assessment and treatment plan with the patient. The patient was provided an opportunity to ask questions and all were answered. The patient agreed with the plan and demonstrated an understanding of the instructions.   The patient was advised to call back or seek an in-person evaluation if the symptoms worsen or if the condition fails to improve as anticipated.  Has appt in June for CPE   [name], MD

## 2018-07-11 ENCOUNTER — Encounter: Payer: Self-pay | Admitting: Internal Medicine

## 2018-07-11 ENCOUNTER — Ambulatory Visit (INDEPENDENT_AMBULATORY_CARE_PROVIDER_SITE_OTHER): Payer: BC Managed Care – PPO | Admitting: Internal Medicine

## 2018-07-11 DIAGNOSIS — M545 Low back pain, unspecified: Secondary | ICD-10-CM

## 2018-07-11 DIAGNOSIS — M4802 Spinal stenosis, cervical region: Secondary | ICD-10-CM | POA: Diagnosis not present

## 2018-07-11 DIAGNOSIS — M5440 Lumbago with sciatica, unspecified side: Secondary | ICD-10-CM

## 2018-07-11 DIAGNOSIS — R7303 Prediabetes: Secondary | ICD-10-CM

## 2018-07-11 DIAGNOSIS — J4530 Mild persistent asthma, uncomplicated: Secondary | ICD-10-CM

## 2018-07-11 DIAGNOSIS — E039 Hypothyroidism, unspecified: Secondary | ICD-10-CM

## 2018-07-11 DIAGNOSIS — G8929 Other chronic pain: Secondary | ICD-10-CM | POA: Diagnosis not present

## 2018-07-11 DIAGNOSIS — I10 Essential (primary) hypertension: Secondary | ICD-10-CM

## 2018-07-11 DIAGNOSIS — F419 Anxiety disorder, unspecified: Secondary | ICD-10-CM

## 2018-07-11 DIAGNOSIS — F3289 Other specified depressive episodes: Secondary | ICD-10-CM

## 2018-07-11 DIAGNOSIS — N3281 Overactive bladder: Secondary | ICD-10-CM

## 2018-07-11 MED ORDER — HYDROCODONE-ACETAMINOPHEN 10-325 MG PO TABS: 2.0000 | ORAL_TABLET | Freq: Two times a day (BID) | ORAL | 0 refills | Status: DC

## 2018-07-11 NOTE — Assessment & Plan Note (Signed)
BP Readings from Last 3 Encounters:  04/11/18 (!) 164/80  01/07/18 138/80  10/08/17 138/70   Not monitoring BP at home, but can start -- she will start taking BP at home Will consider stopping amlodipine - doubt causing increased urination at night, but we can try something different. She will let me know what her BP is at home

## 2018-07-11 NOTE — Assessment & Plan Note (Signed)
Controlled, stable Continue current dose of medication  

## 2018-07-11 NOTE — Assessment & Plan Note (Signed)
Clinically euthyroid Continue current dose of medication  

## 2018-07-11 NOTE — Assessment & Plan Note (Signed)
Taking 1-2 pills BID - typically 4/day when doing yardwork -- often only 3/day Overall pain controlled [place] controlled substance database checked.  Refill due - sent to pharmacy.  Continue current regimen F/u in 3 months

## 2018-07-11 NOTE — Assessment & Plan Note (Signed)
Pain controlled Continue current medication at current dose

## 2018-07-11 NOTE — Assessment & Plan Note (Signed)
Symbicort BID, xopenex prn - takes prior to taking a long walk Controlled overall Continue above

## 2018-07-11 NOTE — Assessment & Plan Note (Signed)
Lab Results  Component Value Date   HGBA1C 5.9 01/07/2018    Low sugar / carb diet Stressed regular exercise

## 2018-07-11 NOTE — Assessment & Plan Note (Signed)
?   Some depression - not sure if it is controlled or not Work on changing routine -to help improve motivation Discussed possibly increasing cymbalta - she will think about it and let me know if she wants a higher dose

## 2018-07-11 NOTE — Assessment & Plan Note (Signed)
Taking Lisbeth Ply - having increased urination at night - she wonders if this is related to amlodipine No symptoms c/w UTI Continue Lisbeth Ply - will consider switching amlodipine

## 2018-07-11 NOTE — Assessment & Plan Note (Signed)
Chronic lower back pain - controlled Continue current dose of vicodin Taking medication appropriately F/u in 3 months

## 2018-07-19 ENCOUNTER — Telehealth: Payer: Self-pay

## 2018-07-19 NOTE — Telephone Encounter (Signed)
KEY: [id number]  PA initiated.

## 2018-07-28 ENCOUNTER — Other Ambulatory Visit: Payer: Self-pay | Admitting: Internal Medicine

## 2018-08-05 ENCOUNTER — Telehealth: Payer: Self-pay | Admitting: Internal Medicine

## 2018-08-05 MED ORDER — AMLODIPINE BESYLATE 2.5 MG PO TABS: 2.5000 mg | ORAL_TABLET | Freq: Every day | ORAL | 0 refills | Status: DC

## 2018-08-05 NOTE — Telephone Encounter (Signed)
Does this medication cause this?

## 2018-08-05 NOTE — Telephone Encounter (Signed)
Copied from [place] [id number]. Topic: Quick Communication - Rx Refill/Question >> Aug 05, 2018  3:18 PM [name] wrote: Medication: amLODipine (NORVASC) 5 MG tablet [[id number]] pt called and stated that she would like the 2.5 mg instead of the 5mg . Pt states that she is going to the bathroom all night long. Pt would like a call back regarding. Please advise

## 2018-08-05 NOTE — Telephone Encounter (Signed)
Amlodipine simply does not cause increased urination.  We can go back to 2.5 mg if she wants to, but if her blood pressure is not controlled on this dose we will need to add another medication to help control it.  Okay to send in if she wants to.

## 2018-08-05 NOTE — Telephone Encounter (Signed)
Pt aware of response and still requested to decrease dose. Medication has been sent to her pharmacy. Told pt to keep check on BP.

## 2018-08-12 ENCOUNTER — Other Ambulatory Visit: Payer: Self-pay | Admitting: Internal Medicine

## 2018-08-23 DIAGNOSIS — I10 Essential (primary) hypertension: Secondary | ICD-10-CM | POA: Insufficient documentation

## 2018-08-23 NOTE — Progress Notes (Signed)
Subjective:    Patient ID: Tonya [name], female    DOB: 10-07-1940, 78 y.o.   MRN: [id number]  HPI She is here for a physical exam.    She has had 7 tick bites since the end of April.  She is concerned about Lyme disease.  She has had spontaneous vomiting when sitting up and reading at night on 2 occasions.  When otherwise episode she did feel some pain in her throat.  She had eaten a while ago.  She was bending over in the yard and had reflux.  She has living on Tums.  She denies any true heartburn.    Medications and allergies reviewed with patient and updated if appropriate.  Patient Active Problem List   Diagnosis Date Noted  . Hypertension 08/23/2018  . DOE (dyspnea on exertion) 01/07/2018  . BRBPR (bright red blood per rectum) 01/07/2018  . Acute pain of right shoulder 01/07/2018  . Pain management contract signed, 01/07/18 01/07/2018  . Osteoarthritis of hand 07/05/2017  . Gross hematuria 10/15/2016  . Prediabetes 09/21/2016  . Encounter for chronic pain management 05/27/2016  . Fatigue 01/15/2016  . Chronic lower back pain 01/15/2016  . Osteopenia, high FRAX 01/14/2016  . DDD (degenerative disc disease), cervical S/P Decompressive laminectomy and Fusion 11/15/2012  . Asthma 11/15/2012  . Hypothyroidism 11/15/2012  . Depression 11/15/2012  . Constipation 11/15/2012  . Overactive bladder 11/15/2012  . Anxiety 11/15/2012  . Cervical spinal stenosis 10/24/2012  . Fracture of cervical vertebra ([place]) 08/31/2012  . Scoliosis 07/05/2012  . Degeneration of intervertebral disc of lumbosacral region 07/04/2012    Current Outpatient Medications on File Prior to Visit  Medication Sig Dispense Refill  . amLODipine (NORVASC) 2.5 MG tablet Take 1 tablet (2.5 mg total) by mouth daily. 90 tablet 0  . Apoaequorin (PREVAGEN PO) Take by mouth.    . budesonide-formoterol (SYMBICORT) 80-4.5 MCG/ACT inhaler TAKE 2 PUFFS BY MOUTH TWICE A DAY 30.6 Inhaler 5  . Calcium Citrate  250 MG TABS Take by mouth.    . cycloSPORINE (RESTASIS) 0.05 % ophthalmic emulsion Place 1 drop into both eyes 2 (two) times daily.    . diphenhydrAMINE HCl (BENADRYL ALLERGY PO) Take by mouth at bedtime.    . DULoxetine (CYMBALTA) 20 MG capsule TAKE 1 CAPSULE BY MOUTH EVERY DAY 90 capsule 1  . fesoterodine (TOVIAZ) 8 MG TB24 tablet TAKE 1 TABLET (8 MG) DAILY. 30 tablet 2  . fish oil-omega-3 fatty acids 1000 MG capsule Take 2 g by mouth daily.    Marland Kitchen gabapentin (NEURONTIN) 300 MG capsule Take 1 capsule (300 mg total) by mouth daily as needed (pain). 30 capsule 0  . HYDROcodone-acetaminophen (NORCO) 10-325 MG tablet Take 2 tablets by mouth 2 (two) times daily. For chronic lower back and neck pain 120 tablet 0  . levalbuterol (XOPENEX HFA) 45 MCG/ACT inhaler Inhale 1-2 puffs into the lungs every 4 (four) hours as needed for wheezing. 1 Inhaler 8  . Magnesium 125 MG CAPS Take 150 mg by mouth 2 (two) times daily.    . multivitamin-lutein (OCUVITE-LUTEIN) CAPS capsule Take by mouth daily.     Marland Kitchen SYNTHROID 75 MCG tablet TAKE 1 TABLET BY MOUTH EVERY DAY 90 tablet 0  . telmisartan (MICARDIS) 40 MG tablet TAKE 1 TABLET BY MOUTH EVERY DAY 90 tablet 1  . vitamin C (ASCORBIC ACID) 500 MG tablet Take 500 mg by mouth daily.     No current facility-administered medications on file prior to  visit.     Past Medical History:  Diagnosis Date  . Anemia   . Asthma   . Back pain, chronic   . Diverticulosis   . H/O arthrodesis 11/22/2012  . History of hysterectomy   . Hypertension   . Spinal stenosis   . Synovial cyst     Past Surgical History:  Procedure Laterality Date  . ABDOMINAL HYSTERECTOMY    . BACK SURGERY    . CYST REMOVAL TRUNK      Social History   Socioeconomic History  . Marital status: Widowed    Spouse name: Not on file  . Number of children: Not on file  . Years of education: Not on file  . Highest education level: Not on file  Occupational History  . Not on file  Social Needs   . Financial resource strain: Not on file  . Food insecurity    Worry: Not on file    Inability: Not on file  . Transportation needs    Medical: Not on file    Non-medical: Not on file  Tobacco Use  . Smoking status: Never Smoker  . Smokeless tobacco: Never Used  Substance and Sexual Activity  . Alcohol use: Yes    Comment: socially  . Drug use: No  . Sexual activity: Not on file  Lifestyle  . Physical activity    Days per week: Not on file    Minutes per session: Not on file  . Stress: Not on file  Relationships  . Social Herbalist on phone: Not on file    Gets together: Not on file    Attends religious service: Not on file    Active member of club or organization: Not on file    Attends meetings of clubs or organizations: Not on file    Relationship status: Not on file  Other Topics Concern  . Not on file  Social History Narrative   Not currently exercising    Family History  Problem Relation Age of Onset  . Other Father        carotid artery disease  . Heart disease Brother   . Alcohol abuse Brother   . Breast cancer Neg Hx     Review of Systems  Constitutional: Negative for chills and fever.  Eyes: Negative for visual disturbance.  Respiratory: Positive for shortness of breath (with strenous activity). Negative for cough and wheezing.   Cardiovascular: Positive for chest pain (one episode - GERD). Negative for palpitations and leg swelling.  Gastrointestinal: Positive for nausea (two episode) and vomiting. Negative for abdominal pain, blood in stool, constipation and diarrhea.       Few episodes of reflux  Genitourinary: Negative for dysuria and hematuria.  Musculoskeletal: Positive for arthralgias and back pain.  Skin: Negative for color change and rash.  Neurological: Positive for light-headedness. Negative for headaches.  Psychiatric/Behavioral: Positive for dysphoric mood. The patient is nervous/anxious.        Objective:   Vitals:    08/24/18 1057  BP: 126/70  Pulse: 71  Resp: 16  Temp: 98.5 F (36.9 C)  SpO2: 98%   Filed Weights   08/24/18 1057  Weight: 101 lb 12.8 oz (46.2 kg)   Body mass index is 22.22 kg/m.  BP Readings from Last 3 Encounters:  08/24/18 126/70  04/11/18 (!) 164/80  01/07/18 138/80    Wt Readings from Last 3 Encounters:  08/24/18 101 lb 12.8 oz (46.2 kg)  04/11/18 105 lb  6.4 oz (47.8 kg)  01/07/18 106 lb (48.1 kg)     Physical Exam Constitutional: She appears well-developed and well-nourished. No distress.  HENT:  Head: Normocephalic and atraumatic.  Right Ear: External ear normal. Normal ear canal and TM Left Ear: External ear normal.  Normal ear canal and TM Mouth/Throat: Oropharynx is clear and moist.  Eyes: Conjunctivae and EOM are normal.  Neck: Neck supple. No tracheal deviation present. No thyromegaly present.  No carotid bruit  Cardiovascular: Normal rate, regular rhythm and normal heart sounds.   No murmur heard.  No edema. Pulmonary/Chest: Effort normal and breath sounds normal. No respiratory distress. She has no wheezes. She has no rales.  Breast: deferred   Abdominal: Soft. She exhibits no distension. There is no tenderness.  Lymphadenopathy: She has no cervical adenopathy.  Skin: Skin is warm and dry. She is not diaphoretic.  Psychiatric: She has a normal mood and affect. Her behavior is normal.        Assessment & Plan:   Physical exam: Screening blood work   ordered Immunizations   All up to date Colonoscopy  N/a due to age 30   Up to date - last 10/2017 Gyn  N/ A - no longer sees Dexa   Up to date - due august 2020 Eye exams   Up to date  Exercise  Yard work, some walking Weight  Normal BMI Skin    No concerns, sees dermatology Substance abuse   none  See Problem List for Assessment and Plan of chronic medical problems.    FU in 2 month for CPM

## 2018-08-23 NOTE — Patient Instructions (Addendum)
Tests ordered today. Your results will be released to [place] (or called to you) after review, usually within 72hours after test completion. If any changes need to be made, you will be notified at that same time.  All other Health Maintenance issues reviewed.   All recommended immunizations and age-appropriate screenings are up-to-date or discussed.  No immunizations administered today.   Medications reviewed and updated.  Changes include :   Start pepcid daily - we can increase this if needed.   Your prescription(s) have been submitted to your pharmacy. Please take as directed and contact our office if you believe you are having problem(s) with the medication(s).   Please followup in 2 months   Health Maintenance, Female Adopting a healthy lifestyle and getting preventive care can go a long way to promote health and wellness. Talk with your health care provider about what schedule of regular examinations is right for you. This is a good chance for you to check in with your provider about disease prevention and staying healthy. In between checkups, there are plenty of things you can do on your own. Experts have done a lot of research about which lifestyle changes and preventive measures are most likely to keep you healthy. Ask your health care provider for more information. Weight and diet Eat a healthy diet  Be sure to include plenty of vegetables, fruits, low-fat dairy products, and lean protein.  Do not eat a lot of foods high in solid fats, added sugars, or salt.  Get regular exercise. This is one of the most important things you can do for your health. ? Most adults should exercise for at least 150 minutes each week. The exercise should increase your heart rate and make you sweat (moderate-intensity exercise). ? Most adults should also do strengthening exercises at least twice a week. This is in addition to the moderate-intensity exercise. Maintain a healthy weight  Body mass  index (BMI) is a measurement that can be used to identify possible weight problems. It estimates body fat based on height and weight. Your health care provider can help determine your BMI and help you achieve or maintain a healthy weight.  For females 19 years of age and older: ? A BMI below 18.5 is considered underweight. ? A BMI of 18.5 to 24.9 is normal. ? A BMI of 25 to 29.9 is considered overweight. ? A BMI of 30 and above is considered obese. Watch levels of cholesterol and blood lipids  You should start having your blood tested for lipids and cholesterol at 77 years of age, then have this test every 5 years.  You may need to have your cholesterol levels checked more often if: ? Your lipid or cholesterol levels are high. ? You are older than 77 years of age. ? You are at high risk for heart disease. Cancer screening Lung Cancer  Lung cancer screening is recommended for adults 51-90 years old who are at high risk for lung cancer because of a history of smoking.  A yearly low-dose CT scan of the lungs is recommended for people who: ? Currently smoke. ? Have quit within the past 15 years. ? Have at least a 30-pack-year history of smoking. A pack year is smoking an average of one pack of cigarettes a day for 1 year.  Yearly screening should continue until it has been 15 years since you quit.  Yearly screening should stop if you develop a health problem that would prevent you from having lung cancer treatment.  Breast Cancer  Practice breast self-awareness. This means understanding how your breasts normally appear and feel.  It also means doing regular breast self-exams. Let your health care provider know about any changes, no matter how small.  If you are in your 20s or 30s, you should have a clinical breast exam (CBE) by a health care provider every 1-3 years as part of a regular health exam.  If you are 38 or older, have a CBE every year. Also consider having a breast X-ray  (mammogram) every year.  If you have a family history of breast cancer, talk to your health care provider about genetic screening.  If you are at high risk for breast cancer, talk to your health care provider about having an MRI and a mammogram every year.  Breast cancer gene (BRCA) assessment is recommended for women who have family members with BRCA-related cancers. BRCA-related cancers include: ? Breast. ? Ovarian. ? Tubal. ? Peritoneal cancers.  Results of the assessment will determine the need for genetic counseling and BRCA1 and BRCA2 testing. Cervical Cancer Your health care provider may recommend that you be screened regularly for cancer of the pelvic organs (ovaries, uterus, and vagina). This screening involves a pelvic examination, including checking for microscopic changes to the surface of your cervix (Pap test). You may be encouraged to have this screening done every 3 years, beginning at age 75.  For women ages 14-65, health care providers may recommend pelvic exams and Pap testing every 3 years, or they may recommend the Pap and pelvic exam, combined with testing for human papilloma virus (HPV), every 5 years. Some types of HPV increase your risk of cervical cancer. Testing for HPV may also be done on women of any age with unclear Pap test results.  Other health care providers may not recommend any screening for nonpregnant women who are considered low risk for pelvic cancer and who do not have symptoms. Ask your health care provider if a screening pelvic exam is right for you.  If you have had past treatment for cervical cancer or a condition that could lead to cancer, you need Pap tests and screening for cancer for at least 20 years after your treatment. If Pap tests have been discontinued, your risk factors (such as having a new sexual partner) need to be reassessed to determine if screening should resume. Some women have medical problems that increase the chance of getting  cervical cancer. In these cases, your health care provider may recommend more frequent screening and Pap tests. Colorectal Cancer  This type of cancer can be detected and often prevented.  Routine colorectal cancer screening usually begins at 77 years of age and continues through 77 years of age.  Your health care provider may recommend screening at an earlier age if you have risk factors for colon cancer.  Your health care provider may also recommend using home test kits to check for hidden blood in the stool.  A small camera at the end of a tube can be used to examine your colon directly (sigmoidoscopy or colonoscopy). This is done to check for the earliest forms of colorectal cancer.  Routine screening usually begins at age 51.  Direct examination of the colon should be repeated every 5-10 years through 77 years of age. However, you may need to be screened more often if early forms of precancerous polyps or small growths are found. Skin Cancer  Check your skin from head to toe regularly.  Tell your health care  provider about any new moles or changes in moles, especially if there is a change in a mole's shape or color.  Also tell your health care provider if you have a mole that is larger than the size of a pencil eraser.  Always use sunscreen. Apply sunscreen liberally and repeatedly throughout the day.  Protect yourself by wearing long sleeves, pants, a wide-brimmed hat, and sunglasses whenever you are outside. Heart disease, diabetes, and high blood pressure  High blood pressure causes heart disease and increases the risk of stroke. High blood pressure is more likely to develop in: ? People who have blood pressure in the high end of the normal range (130-139/85-89 mm Hg). ? People who are overweight or obese. ? People who are African American.  If you are 76-74 years of age, have your blood pressure checked every 3-5 years. If you are 41 years of age or older, have your blood  pressure checked every year. You should have your blood pressure measured twice-once when you are at a hospital or clinic, and once when you are not at a hospital or clinic. Record the average of the two measurements. To check your blood pressure when you are not at a hospital or clinic, you can use: ? An automated blood pressure machine at a pharmacy. ? A home blood pressure monitor.  If you are between 36 years and 74 years old, ask your health care provider if you should take aspirin to prevent strokes.  Have regular diabetes screenings. This involves taking a blood sample to check your fasting blood sugar level. ? If you are at a normal weight and have a low risk for diabetes, have this test once every three years after 77 years of age. ? If you are overweight and have a high risk for diabetes, consider being tested at a younger age or more often. Preventing infection Hepatitis B  If you have a higher risk for hepatitis B, you should be screened for this virus. You are considered at high risk for hepatitis B if: ? You were born in a country where hepatitis B is common. Ask your health care provider which countries are considered high risk. ? Your parents were born in a high-risk country, and you have not been immunized against hepatitis B (hepatitis B vaccine). ? You have HIV or AIDS. ? You use needles to inject street drugs. ? You live with someone who has hepatitis B. ? You have had sex with someone who has hepatitis B. ? You get hemodialysis treatment. ? You take certain medicines for conditions, including cancer, organ transplantation, and autoimmune conditions. Hepatitis C  Blood testing is recommended for: ? Everyone born from [birth year] through 1964. ? Anyone with known risk factors for hepatitis C. Sexually transmitted infections (STIs)  You should be screened for sexually transmitted infections (STIs) including gonorrhea and chlamydia if: ? You are sexually active and are younger  than 77 years of age. ? You are older than 77 years of age and your health care provider tells you that you are at risk for this type of infection. ? Your sexual activity has changed since you were last screened and you are at an increased risk for chlamydia or gonorrhea. Ask your health care provider if you are at risk.  If you do not have HIV, but are at risk, it may be recommended that you take a prescription medicine daily to prevent HIV infection. This is called pre-exposure prophylaxis (PrEP). You are considered at  risk if: ? You are sexually active and do not regularly use condoms or know the HIV status of your partner(s). ? You take drugs by injection. ? You are sexually active with a partner who has HIV. Talk with your health care provider about whether you are at high risk of being infected with HIV. If you choose to begin PrEP, you should first be tested for HIV. You should then be tested every 3 months for as long as you are taking PrEP. Pregnancy  If you are premenopausal and you may become pregnant, ask your health care provider about preconception counseling.  If you may become pregnant, take 400 to 800 micrograms (mcg) of folic acid every day.  If you want to prevent pregnancy, talk to your health care provider about birth control (contraception). Osteoporosis and menopause  Osteoporosis is a disease in which the bones lose minerals and strength with aging. This can result in serious bone fractures. Your risk for osteoporosis can be identified using a bone density scan.  If you are 4 years of age or older, or if you are at risk for osteoporosis and fractures, ask your health care provider if you should be screened.  Ask your health care provider whether you should take a calcium or vitamin D supplement to lower your risk for osteoporosis.  Menopause may have certain physical symptoms and risks.  Hormone replacement therapy may reduce some of these symptoms and risks. Talk  to your health care provider about whether hormone replacement therapy is right for you. Follow these instructions at home:  Schedule regular health, dental, and eye exams.  Stay current with your immunizations.  Do not use any tobacco products including cigarettes, chewing tobacco, or electronic cigarettes.  If you are pregnant, do not drink alcohol.  If you are breastfeeding, limit how much and how often you drink alcohol.  Limit alcohol intake to no more than 1 drink per day for nonpregnant women. One drink equals 12 ounces of beer, 5 ounces of wine, or 1 ounces of hard liquor.  Do not use street drugs.  Do not share needles.  Ask your health care provider for help if you need support or information about quitting drugs.  Tell your health care provider if you often feel depressed.  Tell your health care provider if you have ever been abused or do not feel safe at home. This information is not intended to replace advice given to you by your health care provider. Make sure you discuss any questions you have with your health care provider. Document Released: 09/08/2010 Document Revised: 08/01/2015 Document Reviewed: 11/27/2014 Elsevier Interactive Patient Education  2019 [employer].

## 2018-08-24 ENCOUNTER — Encounter: Payer: Self-pay | Admitting: Internal Medicine

## 2018-08-24 ENCOUNTER — Other Ambulatory Visit (INDEPENDENT_AMBULATORY_CARE_PROVIDER_SITE_OTHER): Payer: BC Managed Care – PPO

## 2018-08-24 ENCOUNTER — Other Ambulatory Visit: Payer: Self-pay

## 2018-08-24 ENCOUNTER — Ambulatory Visit (INDEPENDENT_AMBULATORY_CARE_PROVIDER_SITE_OTHER): Payer: BC Managed Care – PPO | Admitting: Internal Medicine

## 2018-08-24 VITALS — BP 126/70 | HR 71 | Temp 98.5°F | Resp 16 | Ht <= 58 in | Wt 101.8 lb

## 2018-08-24 DIAGNOSIS — M5440 Lumbago with sciatica, unspecified side: Secondary | ICD-10-CM

## 2018-08-24 DIAGNOSIS — M8580 Other specified disorders of bone density and structure, unspecified site: Secondary | ICD-10-CM | POA: Diagnosis not present

## 2018-08-24 DIAGNOSIS — I10 Essential (primary) hypertension: Secondary | ICD-10-CM

## 2018-08-24 DIAGNOSIS — Z0000 Encounter for general adult medical examination without abnormal findings: Secondary | ICD-10-CM

## 2018-08-24 DIAGNOSIS — R7303 Prediabetes: Secondary | ICD-10-CM

## 2018-08-24 DIAGNOSIS — E039 Hypothyroidism, unspecified: Secondary | ICD-10-CM | POA: Diagnosis not present

## 2018-08-24 DIAGNOSIS — F419 Anxiety disorder, unspecified: Secondary | ICD-10-CM

## 2018-08-24 DIAGNOSIS — M2550 Pain in unspecified joint: Secondary | ICD-10-CM | POA: Diagnosis not present

## 2018-08-24 DIAGNOSIS — G8929 Other chronic pain: Secondary | ICD-10-CM

## 2018-08-24 DIAGNOSIS — J4530 Mild persistent asthma, uncomplicated: Secondary | ICD-10-CM

## 2018-08-24 DIAGNOSIS — F3289 Other specified depressive episodes: Secondary | ICD-10-CM

## 2018-08-24 LAB — COMPREHENSIVE METABOLIC PANEL
ALT: 16 U/L (ref 0–35)
AST: 18 U/L (ref 0–37)
Albumin: 4.5 g/dL (ref 3.5–5.2)
Alkaline Phosphatase: 73 U/L (ref 39–117)
BUN: 34 mg/dL — ABNORMAL HIGH (ref 6–23)
CO2: 30 mEq/L (ref 19–32)
Calcium: 10 mg/dL (ref 8.4–10.5)
Chloride: 100 mEq/L (ref 96–112)
Creatinine, Ser: 1.04 mg/dL (ref 0.40–1.20)
GFR: 51.43 mL/min — ABNORMAL LOW (ref >60.00)
Glucose, Bld: 80 mg/dL (ref 70–99)
Potassium: 4.4 mEq/L (ref 3.5–5.1)
Sodium: 138 mEq/L (ref 135–145)
Total Bilirubin: 0.4 mg/dL (ref 0.2–1.2)
Total Protein: 7.1 g/dL (ref 6.0–8.3)

## 2018-08-24 LAB — LIPID PANEL
Cholesterol: 219 mg/dL — ABNORMAL HIGH (ref 0–200)
HDL: 94.5 mg/dL (ref >39.00)
LDL Cholesterol: 111 mg/dL — ABNORMAL HIGH (ref 0–99)
NonHDL: 124.08
Total CHOL/HDL Ratio: 2
Triglycerides: 65 mg/dL (ref 0.0–149.0)
VLDL: 13 mg/dL (ref 0.0–40.0)

## 2018-08-24 LAB — CBC WITH DIFFERENTIAL/PLATELET
Basophils Absolute: 0.1 10*3/uL (ref 0.0–0.1)
Basophils Relative: 0.8 % (ref 0.0–3.0)
Eosinophils Absolute: 0.2 10*3/uL (ref 0.0–0.7)
Eosinophils Relative: 2.9 % (ref 0.0–5.0)
HCT: 36.8 % (ref 36.0–46.0)
Hemoglobin: 12.3 g/dL (ref 12.0–15.0)
Lymphocytes Relative: 26.3 % (ref 12.0–46.0)
Lymphs Abs: 1.8 10*3/uL (ref 0.7–4.0)
MCHC: 33.3 g/dL (ref 30.0–36.0)
MCV: 94.2 fl (ref 78.0–100.0)
Monocytes Absolute: 0.7 10*3/uL (ref 0.1–1.0)
Monocytes Relative: 10.1 % (ref 3.0–12.0)
Neutro Abs: 4.2 10*3/uL (ref 1.4–7.7)
Neutrophils Relative %: 59.9 % (ref 43.0–77.0)
Platelets: 219 10*3/uL (ref 150.0–400.0)
RBC: 3.91 Mil/uL (ref 3.87–5.11)
RDW: 13.9 % (ref 11.5–15.5)
WBC: 7 10*3/uL (ref 4.0–10.5)

## 2018-08-24 LAB — TSH: TSH: 0.35 u[IU]/mL (ref 0.35–4.50)

## 2018-08-24 LAB — HEMOGLOBIN A1C: Hgb A1c MFr Bld: 5.9 % (ref 4.6–6.5)

## 2018-08-24 MED ORDER — HYDROCODONE-ACETAMINOPHEN 10-325 MG PO TABS: 2.0000 | ORAL_TABLET | Freq: Two times a day (BID) | ORAL | 0 refills | Status: DC

## 2018-08-24 MED ORDER — FAMOTIDINE 40 MG PO TABS: 40.0000 mg | ORAL_TABLET | Freq: Every day | ORAL | 1 refills | Status: DC

## 2018-08-24 NOTE — Assessment & Plan Note (Signed)
Has joint pain Has had multiple tick bites and she is concerned about the possibility of Lyme disease-we will check for Lyme Discussed tick prevention

## 2018-08-24 NOTE — Assessment & Plan Note (Signed)
Controlled, stable Continue current dose of medication  

## 2018-08-24 NOTE — Assessment & Plan Note (Signed)
Check a1c Low sugar / carb diet Stressed regular exercise   

## 2018-08-24 NOTE — Assessment & Plan Note (Addendum)
dexa up to date - due in August Taking calcium and vitamin D Exercising - walking and  yard work

## 2018-08-24 NOTE — Assessment & Plan Note (Signed)
BP well controlled Current regimen effective and well tolerated Continue current medications at current doses cmp  

## 2018-08-24 NOTE — Assessment & Plan Note (Signed)
Clinically euthyroid Check tsh  Titrate med dose if needed  

## 2018-08-24 NOTE — Assessment & Plan Note (Addendum)
Taking symbicort twice daily, albuterol prn ? ideally controlled-she does have some shortness of breath, but states that only with strenuous exercise-she goes walking with a friend who is able to walk much faster and this is when she gets short of breath.  She does not feel short of breath when she walks around place.  Taking albuterol prior to this has not helped Continue current dose of Symbicort and albuterol as needed

## 2018-08-25 ENCOUNTER — Other Ambulatory Visit: Payer: Self-pay | Admitting: Internal Medicine

## 2018-08-25 ENCOUNTER — Encounter: Payer: Self-pay | Admitting: Internal Medicine

## 2018-08-25 LAB — B. BURGDORFI ANTIBODIES: B burgdorferi Ab IgG+IgM: <0.9 index

## 2018-09-22 DIAGNOSIS — D485 Neoplasm of uncertain behavior of skin: Secondary | ICD-10-CM | POA: Diagnosis not present

## 2018-09-22 DIAGNOSIS — L821 Other seborrheic keratosis: Secondary | ICD-10-CM | POA: Diagnosis not present

## 2018-09-22 DIAGNOSIS — D0472 Carcinoma in situ of skin of left lower limb, including hip: Secondary | ICD-10-CM | POA: Diagnosis not present

## 2018-09-22 DIAGNOSIS — L814 Other melanin hyperpigmentation: Secondary | ICD-10-CM | POA: Diagnosis not present

## 2018-09-22 DIAGNOSIS — Z808 Family history of malignant neoplasm of other organs or systems: Secondary | ICD-10-CM | POA: Diagnosis not present

## 2018-09-22 DIAGNOSIS — D2272 Melanocytic nevi of left lower limb, including hip: Secondary | ICD-10-CM | POA: Diagnosis not present

## 2018-09-22 DIAGNOSIS — Z85828 Personal history of other malignant neoplasm of skin: Secondary | ICD-10-CM | POA: Diagnosis not present

## 2018-09-26 ENCOUNTER — Other Ambulatory Visit: Payer: Self-pay | Admitting: Internal Medicine

## 2018-10-03 ENCOUNTER — Inpatient Hospital Stay (HOSPITAL_COMMUNITY)
Admission: EM | Admit: 2018-10-03 | Discharge: 2018-10-06 | DRG: 641 | Disposition: A | Discharge status: Home / Self Care | Payer: Medicare HMO | Attending: Internal Medicine | Admitting: Internal Medicine

## 2018-10-03 ENCOUNTER — Telehealth: Payer: Self-pay

## 2018-10-03 ENCOUNTER — Other Ambulatory Visit: Payer: Self-pay

## 2018-10-03 ENCOUNTER — Encounter (HOSPITAL_COMMUNITY): Payer: Self-pay

## 2018-10-03 DIAGNOSIS — B349 Viral infection, unspecified: Secondary | ICD-10-CM | POA: Diagnosis present

## 2018-10-03 DIAGNOSIS — D696 Thrombocytopenia, unspecified: Secondary | ICD-10-CM | POA: Diagnosis present

## 2018-10-03 DIAGNOSIS — J4530 Mild persistent asthma, uncomplicated: Secondary | ICD-10-CM | POA: Diagnosis not present

## 2018-10-03 DIAGNOSIS — Z91013 Allergy to seafood: Secondary | ICD-10-CM | POA: Diagnosis not present

## 2018-10-03 DIAGNOSIS — M549 Dorsalgia, unspecified: Secondary | ICD-10-CM | POA: Diagnosis present

## 2018-10-03 DIAGNOSIS — Z888 Allergy status to other drugs, medicaments and biological substances status: Secondary | ICD-10-CM

## 2018-10-03 DIAGNOSIS — Z7951 Long term (current) use of inhaled steroids: Secondary | ICD-10-CM | POA: Diagnosis not present

## 2018-10-03 DIAGNOSIS — J45909 Unspecified asthma, uncomplicated: Secondary | ICD-10-CM | POA: Diagnosis present

## 2018-10-03 DIAGNOSIS — R1110 Vomiting, unspecified: Secondary | ICD-10-CM | POA: Diagnosis not present

## 2018-10-03 DIAGNOSIS — R112 Nausea with vomiting, unspecified: Secondary | ICD-10-CM | POA: Diagnosis not present

## 2018-10-03 DIAGNOSIS — D72819 Decreased white blood cell count, unspecified: Secondary | ICD-10-CM | POA: Diagnosis not present

## 2018-10-03 DIAGNOSIS — Z811 Family history of alcohol abuse and dependence: Secondary | ICD-10-CM | POA: Diagnosis not present

## 2018-10-03 DIAGNOSIS — Z79890 Hormone replacement therapy (postmenopausal): Secondary | ICD-10-CM

## 2018-10-03 DIAGNOSIS — K529 Noninfective gastroenteritis and colitis, unspecified: Secondary | ICD-10-CM | POA: Diagnosis not present

## 2018-10-03 DIAGNOSIS — E871 Hypo-osmolality and hyponatremia: Secondary | ICD-10-CM | POA: Diagnosis present

## 2018-10-03 DIAGNOSIS — K5790 Diverticulosis of intestine, part unspecified, without perforation or abscess without bleeding: Secondary | ICD-10-CM | POA: Diagnosis not present

## 2018-10-03 DIAGNOSIS — E039 Hypothyroidism, unspecified: Secondary | ICD-10-CM | POA: Diagnosis present

## 2018-10-03 DIAGNOSIS — Z8249 Family history of ischemic heart disease and other diseases of the circulatory system: Secondary | ICD-10-CM | POA: Diagnosis not present

## 2018-10-03 DIAGNOSIS — I10 Essential (primary) hypertension: Secondary | ICD-10-CM | POA: Diagnosis present

## 2018-10-03 DIAGNOSIS — Z20828 Contact with and (suspected) exposure to other viral communicable diseases: Secondary | ICD-10-CM | POA: Diagnosis not present

## 2018-10-03 DIAGNOSIS — M8580 Other specified disorders of bone density and structure, unspecified site: Secondary | ICD-10-CM | POA: Diagnosis not present

## 2018-10-03 DIAGNOSIS — M19049 Primary osteoarthritis, unspecified hand: Secondary | ICD-10-CM | POA: Diagnosis not present

## 2018-10-03 DIAGNOSIS — G8929 Other chronic pain: Secondary | ICD-10-CM | POA: Diagnosis present

## 2018-10-03 DIAGNOSIS — E860 Dehydration: Secondary | ICD-10-CM | POA: Diagnosis not present

## 2018-10-03 DIAGNOSIS — R197 Diarrhea, unspecified: Secondary | ICD-10-CM | POA: Diagnosis not present

## 2018-10-03 LAB — CBC
HCT: 41.4 % (ref 36.0–46.0)
Hemoglobin: 13.5 g/dL (ref 12.0–15.0)
MCH: 30.2 pg (ref 26.0–34.0)
MCHC: 32.6 g/dL (ref 30.0–36.0)
MCV: 92.6 fL (ref 80.0–100.0)
Platelets: 80 10*3/uL — ABNORMAL LOW (ref 150–400)
RBC: 4.47 MIL/uL (ref 3.87–5.11)
RDW: 12.8 % (ref 11.5–15.5)
WBC: 3 10*3/uL — ABNORMAL LOW (ref 4.0–10.5)
nRBC: 0 % (ref 0.0–0.2)

## 2018-10-03 LAB — COMPREHENSIVE METABOLIC PANEL
ALT: 83 U/L — ABNORMAL HIGH (ref 0–44)
AST: 98 U/L — ABNORMAL HIGH (ref 15–41)
Albumin: 3.7 g/dL (ref 3.5–5.0)
Alkaline Phosphatase: 113 U/L (ref 38–126)
Anion gap: 14 (ref 5–15)
BUN: 16 mg/dL (ref 8–23)
CO2: 23 mmol/L (ref 22–32)
Calcium: 8.7 mg/dL — ABNORMAL LOW (ref 8.9–10.3)
Chloride: 96 mmol/L — ABNORMAL LOW (ref 98–111)
Creatinine, Ser: 0.71 mg/dL (ref 0.44–1.00)
GFR calc Af Amer: >60 mL/min (ref >60)
GFR calc non Af Amer: >60 mL/min (ref >60)
Glucose, Bld: 93 mg/dL (ref 70–99)
Potassium: 3.9 mmol/L (ref 3.5–5.1)
Sodium: 133 mmol/L — ABNORMAL LOW (ref 135–145)
Total Bilirubin: 0.6 mg/dL (ref 0.3–1.2)
Total Protein: 6.6 g/dL (ref 6.5–8.1)

## 2018-10-03 LAB — URINALYSIS, ROUTINE W REFLEX MICROSCOPIC
Bacteria, UA: NONE SEEN
Bilirubin Urine: NEGATIVE
Glucose, UA: NEGATIVE mg/dL
Hgb urine dipstick: NEGATIVE
Ketones, ur: 80 mg/dL — AB
Leukocytes,Ua: NEGATIVE
Nitrite: NEGATIVE
Protein, ur: 100 mg/dL — AB
Specific Gravity, Urine: 1.023 (ref 1.005–1.030)
pH: 6 (ref 5.0–8.0)

## 2018-10-03 LAB — LIPASE, BLOOD: Lipase: 118 U/L — ABNORMAL HIGH (ref 11–51)

## 2018-10-03 MED ORDER — SODIUM CHLORIDE 0.9% FLUSH: 3.0000 mL | Freq: Once | INTRAVENOUS | Status: DC

## 2018-10-03 MED ORDER — IOHEXOL 300 MG/ML  SOLN
30.0000 mL | Freq: Once | INTRAMUSCULAR | Status: AC | PRN
  Administered 2018-10-03: 30 mL via ORAL

## 2018-10-03 MED ORDER — SODIUM CHLORIDE 0.9 % IV BOLUS
1000.0000 mL | Freq: Once | INTRAVENOUS | Status: AC
  Administered 2018-10-03: 1000 mL via INTRAVENOUS

## 2018-10-03 MED ORDER — SODIUM CHLORIDE (PF) 0.9 % IJ SOLN
INTRAMUSCULAR | Status: AC
  Administered 2018-10-04: 07:00:00
  Filled 2018-10-03: qty 50

## 2018-10-03 NOTE — ED Triage Notes (Addendum)
C/O N/V and diarrhea since Friday night.   2 occurences of emesis in past 24 hours.  3 occurences of diarrhea in past 24 hours.   Called PCP and told to come to ED for fluids.  Patient states she did have generalized abdomen pain but, not anymore.   Offered Nausea medication in triage. Patient states she is not nauscious at this moment but will call out if that changes. Patient states she just feels "awful".   A/Ox4 Ambulatory in triage.

## 2018-10-03 NOTE — Telephone Encounter (Signed)
Daughter aware of response below and expressed understanding.  

## 2018-10-03 NOTE — Telephone Encounter (Signed)
Should go to ED - sounds like she needs fluids

## 2018-10-03 NOTE — Telephone Encounter (Signed)
Copied from [place] [id number]. Topic: Appointment Scheduling - Scheduling Inquiry for Clinic >> Oct 03, 2018  2:09 PM Richardo Priest, [place] wrote: Reason for CRM: Patient's daughter called in stating she would like a virtual/telephone visit for mother due to her vomitting on and off since Saturday. States mother is very dehydrated but does not want to come into the office. Daughter has pushed fluids and soft/liquid foods but patient will not have any. Please advise. Call back is [phone].

## 2018-10-03 NOTE — Telephone Encounter (Signed)
Pts daughter stated pt could not sit up in bed. Would you prefer that she go to ED?

## 2018-10-03 NOTE — ED Provider Notes (Signed)
[place] DEPT Provider Note   CSN: [id number] Arrival date & time: 10/03/18  1642    History   Chief Complaint Chief Complaint  Patient presents with  . Emesis  . Diarrhea    HPI Tonya [name] is a 78 y.o. female with PMHx asthma, diverticulosis, HTN who presents to the ED complaining of nausea, NBNB emesis, and diarrhea x 2 days. Pt also complains of LLQ abdominal pain that resolved on the first day of her symptoms. She reports trying to keep fluids down but she has not had much of an appetite. No suspicious food intake. No recent abx. Pt reports very occasional alcohol use; states the last time she had any alcohol was 3-4 weeks ago. No known covid 19 positive exposures. Denies fever, chills, hematemesis, hematochezia, melena, dysuria, urinary frequency, flank pain, or any other associated symptoms.         Past Medical History:  Diagnosis Date  . Anemia   . Asthma   . Back pain, chronic   . Diverticulosis   . H/O arthrodesis 11/22/2012  . History of hysterectomy   . Hypertension   . Spinal stenosis   . Synovial cyst     Patient Active Problem List   Diagnosis Date Noted  . Arthralgia 08/24/2018  . Hypertension 08/23/2018  . DOE (dyspnea on exertion) 01/07/2018  . BRBPR (bright red blood per rectum) 01/07/2018  . Acute pain of right shoulder 01/07/2018  . Pain management contract signed, 01/07/18 01/07/2018  . Osteoarthritis of hand 07/05/2017  . Gross hematuria 10/15/2016  . Prediabetes 09/21/2016  . Encounter for chronic pain management 05/27/2016  . Fatigue 01/15/2016  . Chronic lower back pain 01/15/2016  . Osteopenia, high FRAX 01/14/2016  . DDD (degenerative disc disease), cervical S/P Decompressive laminectomy and Fusion 11/15/2012  . Asthma 11/15/2012  . Hypothyroidism 11/15/2012  . Depression 11/15/2012  . Constipation 11/15/2012  . Overactive bladder 11/15/2012  . Anxiety 11/15/2012  . Cervical spinal stenosis  10/24/2012  . Fracture of cervical vertebra ([place]) 08/31/2012  . Scoliosis 07/05/2012  . Degeneration of intervertebral disc of lumbosacral region 07/04/2012    Past Surgical History:  Procedure Laterality Date  . ABDOMINAL HYSTERECTOMY    . BACK SURGERY    . CYST REMOVAL TRUNK       OB History   No obstetric history on file.      Home Medications    Prior to Admission medications   Medication Sig Start Date End Date Taking? Authorizing Provider  amLODipine (NORVASC) 2.5 MG tablet Take 1 tablet (2.5 mg total) by mouth daily. 08/05/18  Yes Burns, Claudina Lick, MD  budesonide-formoterol (SYMBICORT) 80-4.5 MCG/ACT inhaler TAKE 2 PUFFS BY MOUTH TWICE A DAY 04/11/18  Yes Burns, Claudina Lick, MD  Calcium Citrate 250 MG TABS Take 1 tablet by mouth daily.    Yes [provider]  cycloSPORINE (RESTASIS) 0.05 % ophthalmic emulsion Place 1 drop into both eyes 2 (two) times daily.   Yes [provider]  dimenhyDRINATE (DRAMAMINE) 50 MG tablet Take 50 mg by mouth every 8 (eight) hours as needed for nausea.   Yes [provider]  diphenhydrAMINE HCl (BENADRYL ALLERGY PO) Take 1 tablet by mouth daily as needed (sleep).    Yes [provider]  DULoxetine (CYMBALTA) 20 MG capsule TAKE 1 CAPSULE BY MOUTH EVERY DAY 05/11/18  Yes Burns, Claudina Lick, MD  famotidine (PEPCID) 40 MG tablet Take 1 tablet (40 mg total) by mouth daily. 08/24/18  Yes [name], MD  fesoterodine (TOVIAZ) 8 MG TB24 tablet TAKE 1 TABLET (8 MG) DAILY. 09/27/18  Yes Burns, Claudina Lick, MD  HYDROcodone-acetaminophen (NORCO) 10-325 MG tablet Take 2 tablets by mouth 2 (two) times daily. For chronic lower back and neck pain 08/24/18  Yes Burns, Claudina Lick, MD  Magnesium 125 MG CAPS Take 125 mg by mouth every evening.    Yes [provider]  SYNTHROID 75 MCG tablet TAKE 1 TABLET BY MOUTH EVERY DAY 08/25/18  Yes Burns, Claudina Lick, MD  telmisartan (MICARDIS) 40 MG tablet TAKE 1 TABLET BY MOUTH EVERY DAY 08/12/18  Yes Burns,  Claudina Lick, MD  vitamin C (ASCORBIC ACID) 500 MG tablet Take 500 mg by mouth daily.   Yes [provider]  vitamin E 100 UNIT capsule Take 100 Units by mouth daily.   Yes [provider]  gabapentin (NEURONTIN) 300 MG capsule Take 1 capsule (300 mg total) by mouth daily as needed (pain). Patient not taking: Reported on 10/03/2018 11/30/16   [name], MD  levalbuterol [facility] HFA) 45 MCG/ACT inhaler Inhale 1-2 puffs into the lungs every 4 (four) hours as needed for wheezing. 04/11/18   [name], MD    Family History Family History  Problem Relation Age of Onset  . Other Father        carotid artery disease  . Heart disease Brother   . Alcohol abuse Brother   . Breast cancer Neg Hx     Social History Social History   Tobacco Use  . Smoking status: Never Smoker  . Smokeless tobacco: Never Used  Substance Use Topics  . Alcohol use: Yes    Comment: socially  . Drug use: No     Allergies   Prochlorperazine edisylate and Other   Review of Systems Review of Systems  Constitutional: Negative for chills and fever.  HENT: Negative for congestion.   Eyes: Negative for visual disturbance.  Respiratory: Negative for cough and shortness of breath.   Cardiovascular: Negative for chest pain.  Gastrointestinal: Positive for abdominal pain, diarrhea, nausea and vomiting. Negative for blood in stool and constipation.  Genitourinary: Negative for dysuria, flank pain and frequency.  Musculoskeletal: Negative for myalgias.  Skin: Negative for rash.  Neurological: Negative for syncope.     Physical Exam Updated Vital Signs BP 122/78 (BP Location: Left Arm)   Pulse 93   Temp 97.9 F (36.6 C) (Oral)   Resp 15   Ht '4\' 9"'  (1.448 m)   Wt 45.4 kg   SpO2 99%   BMI 21.64 kg/m   Physical Exam Vitals signs and nursing note reviewed.  Constitutional:      Appearance: She is not ill-appearing.  HENT:     Head: Normocephalic and atraumatic.     Mouth/Throat:      Mouth: Mucous membranes are dry.  Eyes:     Conjunctiva/sclera: Conjunctivae normal.  Neck:     Musculoskeletal: Neck supple.  Cardiovascular:     Rate and Rhythm: Normal rate and regular rhythm.     Pulses: Normal pulses.  Pulmonary:     Effort: Pulmonary effort is normal.     Breath sounds: Normal breath sounds. No wheezing, rhonchi or rales.  Abdominal:     Palpations: Abdomen is soft.     Tenderness: There is no abdominal tenderness. There is no right CVA tenderness, left CVA tenderness, guarding or rebound.  Skin:    General: Skin is warm and dry.  Neurological:     Mental Status: She is alert.      ED Treatments / Results  Labs (all labs ordered are listed, but only abnormal results are displayed) Labs Reviewed  LIPASE, BLOOD - Abnormal; Notable for the following components:      Result Value   Lipase 118 (*)    All other components within normal limits  COMPREHENSIVE METABOLIC PANEL - Abnormal; Notable for the following components:   Sodium 133 (*)    Chloride 96 (*)    Calcium 8.7 (*)    AST 98 (*)    ALT 83 (*)    All other components within normal limits  CBC - Abnormal; Notable for the following components:   WBC 3.0 (*)    Platelets 80 (*)    All other components within normal limits  URINALYSIS, ROUTINE W REFLEX MICROSCOPIC - Abnormal; Notable for the following components:   APPearance HAZY (*)    Ketones, ur 80 (*)    Protein, ur 100 (*)    All other components within normal limits  SARS CORONAVIRUS 2 (HOSPITAL ORDER, [facility])    EKG None  Radiology No results found.  Procedures Procedures (including critical care time)  Medications Ordered in ED Medications  sodium chloride flush (NS) 0.9 % injection 3 mL (has no administration in time range)  sodium chloride (PF) 0.9 % injection (has no administration in time range)  sodium chloride 0.9 % bolus 1,000 mL (0 mLs Intravenous Stopped 10/04/18 0018)  iohexol  (OMNIPAQUE) 300 MG/ML solution 30 mL (30 mLs Oral Contrast Given 10/03/18 2339)     Initial Impression / Assessment and Plan / ED Course  I have reviewed the triage vital signs and the nursing notes.  Pertinent labs & imaging results that were available during my care of the patient were reviewed by me and considered in my medical decision making (see chart for details).    Pt is a 78 year old female who presents to the ED with N/V/D x 2 days with LLQ abdominal pain that has resolved without medication. No known recent sick contacts/covid 19 exposures. No alcohol use. No suspicious food intake. No excessive NSAID use. Pt afebrile in the ED without tachycardia or tachypnea. She is nontender to her abdomen on exam; initially did not think patient needed CT scan given nontender and low suspicion of acute abdomen although given hx of diverticulosis and LLQ pain pt may be experiencing diverticulitis. Labs ordered and drawn prior to being seen - pt has leukopenia and thrombocytopenia; most recent labs 1 month ago which were normal. Pt denies recent weight loss. AST and ALT as well as lipase also elevated. No increase in alk phos. Pt again denies excessive alcohol use. Very low suspicion for pancreatitis today. She is without jaundice or scleral icterus today. Pt does report hx of tick bites, recently had her lyme titers drawn which were negative. Frequently checks herself for ticks and has not seen any new recently. Will send RMSF titer. Will swab for covid today despite no recent exposure. 1 L IVF given for comfort, pt declines antiemetics at this time.   WBC  Date Value Ref Range Status  10/03/2018 3.0 (L) 4.0 - 10.5 K/uL Final  08/24/2018 7.0 4.0 - 10.5 K/uL Final  01/07/2018 7.6 4.0 - 10.5 K/uL Final  07/26/2017 8.0 4.0 - 10.5 K/uL Final    Covid negative. Awaiting CT scan at this time.   At shift change case  signed out to Joline Maxcy, PA-C, who will dispo patient accordingly after CT A/P  returns.        Final Clinical Impressions(s) / ED Diagnoses   Final diagnoses:  Nausea vomiting and diarrhea  Dehydration  Thrombocytopenia (HCC)  Leukopenia, unspecified type    ED Discharge Orders    None       Eustaquio Maize, PA-C 10/04/18 0114    Hayden Rasmussen, MD 10/04/18 1315

## 2018-10-04 ENCOUNTER — Encounter (HOSPITAL_COMMUNITY): Payer: Self-pay

## 2018-10-04 ENCOUNTER — Emergency Department (HOSPITAL_COMMUNITY): Payer: Medicare HMO

## 2018-10-04 DIAGNOSIS — I10 Essential (primary) hypertension: Secondary | ICD-10-CM

## 2018-10-04 DIAGNOSIS — E860 Dehydration: Secondary | ICD-10-CM | POA: Diagnosis not present

## 2018-10-04 DIAGNOSIS — K529 Noninfective gastroenteritis and colitis, unspecified: Secondary | ICD-10-CM | POA: Diagnosis not present

## 2018-10-04 DIAGNOSIS — D72819 Decreased white blood cell count, unspecified: Secondary | ICD-10-CM | POA: Diagnosis present

## 2018-10-04 DIAGNOSIS — J4530 Mild persistent asthma, uncomplicated: Secondary | ICD-10-CM

## 2018-10-04 DIAGNOSIS — R112 Nausea with vomiting, unspecified: Secondary | ICD-10-CM | POA: Diagnosis not present

## 2018-10-04 DIAGNOSIS — D696 Thrombocytopenia, unspecified: Secondary | ICD-10-CM | POA: Diagnosis present

## 2018-10-04 DIAGNOSIS — R197 Diarrhea, unspecified: Secondary | ICD-10-CM

## 2018-10-04 DIAGNOSIS — E039 Hypothyroidism, unspecified: Secondary | ICD-10-CM

## 2018-10-04 LAB — CK: Total CK: 120 U/L (ref 38–234)

## 2018-10-04 LAB — SARS CORONAVIRUS 2 BY RT PCR (HOSPITAL ORDER, PERFORMED IN ~~LOC~~ HOSPITAL LAB): SARS Coronavirus 2: NEGATIVE

## 2018-10-04 MED ORDER — IOHEXOL 300 MG/ML  SOLN
100.0000 mL | Freq: Once | INTRAMUSCULAR | Status: AC | PRN
  Administered 2018-10-04: 80 mL via INTRAVENOUS

## 2018-10-04 MED ORDER — LOPERAMIDE HCL 2 MG PO CAPS
2.0000 mg | ORAL_CAPSULE | Freq: Once | ORAL | Status: AC
  Administered 2018-10-04: 05:00:00 2 mg via ORAL
  Filled 2018-10-04: qty 1

## 2018-10-04 MED ORDER — ACETAMINOPHEN 650 MG RE SUPP: 650.0000 mg | Freq: Four times a day (QID) | RECTAL | Status: DC | PRN

## 2018-10-04 MED ORDER — IRBESARTAN 150 MG PO TABS
150.0000 mg | ORAL_TABLET | Freq: Every day | ORAL | Status: DC
  Administered 2018-10-05 – 2018-10-06 (×2): 150 mg via ORAL
  Filled 2018-10-04 (×3): qty 1

## 2018-10-04 MED ORDER — HYDRALAZINE HCL 20 MG/ML IJ SOLN
10.0000 mg | INTRAMUSCULAR | Status: DC | PRN
  Administered 2018-10-04: 10 mg via INTRAVENOUS
  Filled 2018-10-04: qty 1

## 2018-10-04 MED ORDER — LEVALBUTEROL HCL 0.63 MG/3ML IN NEBU
0.6300 mg | INHALATION_SOLUTION | Freq: Four times a day (QID) | RESPIRATORY_TRACT | Status: DC | PRN
  Filled 2018-10-04: qty 3

## 2018-10-04 MED ORDER — AMLODIPINE BESYLATE 5 MG PO TABS
2.5000 mg | ORAL_TABLET | Freq: Every day | ORAL | Status: DC
  Administered 2018-10-05 – 2018-10-06 (×2): 2.5 mg via ORAL
  Filled 2018-10-04 (×3): qty 1

## 2018-10-04 MED ORDER — FESOTERODINE FUMARATE ER 8 MG PO TB24
8.0000 mg | ORAL_TABLET | Freq: Every day | ORAL | Status: DC
  Administered 2018-10-05 – 2018-10-06 (×2): 8 mg via ORAL
  Filled 2018-10-04 (×3): qty 1

## 2018-10-04 MED ORDER — MOMETASONE FURO-FORMOTEROL FUM 100-5 MCG/ACT IN AERO
2.0000 | INHALATION_SPRAY | Freq: Two times a day (BID) | RESPIRATORY_TRACT | Status: DC
  Administered 2018-10-04 – 2018-10-06 (×4): 2 via RESPIRATORY_TRACT
  Filled 2018-10-04: qty 8.8

## 2018-10-04 MED ORDER — SODIUM CHLORIDE 0.9 % IV SOLN: INTRAVENOUS | Status: AC

## 2018-10-04 MED ORDER — FAMOTIDINE 20 MG PO TABS
40.0000 mg | ORAL_TABLET | Freq: Every day | ORAL | Status: DC
  Administered 2018-10-05 – 2018-10-06 (×2): 40 mg via ORAL
  Filled 2018-10-04 (×3): qty 2

## 2018-10-04 MED ORDER — ONDANSETRON HCL 4 MG/2ML IJ SOLN: 4.0000 mg | Freq: Four times a day (QID) | INTRAMUSCULAR | Status: DC | PRN

## 2018-10-04 MED ORDER — SODIUM CHLORIDE 0.9 % IV BOLUS: 1500.0000 mL | Freq: Once | INTRAVENOUS | Status: AC

## 2018-10-04 MED ORDER — LEVOTHYROXINE SODIUM 75 MCG PO TABS
75.0000 ug | ORAL_TABLET | Freq: Every day | ORAL | Status: DC
  Administered 2018-10-05 – 2018-10-06 (×2): 75 ug via ORAL
  Filled 2018-10-04 (×3): qty 1

## 2018-10-04 MED ORDER — DULOXETINE HCL 20 MG PO CPEP
20.0000 mg | ORAL_CAPSULE | Freq: Every day | ORAL | Status: DC
  Administered 2018-10-05 – 2018-10-06 (×2): 20 mg via ORAL
  Filled 2018-10-04 (×3): qty 1

## 2018-10-04 MED ORDER — ACETAMINOPHEN 325 MG PO TABS: 650.0000 mg | ORAL_TABLET | Freq: Four times a day (QID) | ORAL | Status: DC | PRN

## 2018-10-04 MED ORDER — HYDROCODONE-ACETAMINOPHEN 5-325 MG PO TABS
2.0000 | ORAL_TABLET | Freq: Once | ORAL | Status: AC
  Administered 2018-10-04: 2 via ORAL
  Filled 2018-10-04: qty 2

## 2018-10-04 MED ORDER — ONDANSETRON HCL 4 MG PO TABS: 4.0000 mg | ORAL_TABLET | Freq: Four times a day (QID) | ORAL | Status: DC | PRN

## 2018-10-04 MED ORDER — SODIUM CHLORIDE 0.9 % IV SOLN
INTRAVENOUS | Status: DC
  Administered 2018-10-04 – 2018-10-06 (×2): via INTRAVENOUS

## 2018-10-04 MED ORDER — HYDROCODONE-ACETAMINOPHEN 5-325 MG PO TABS
1.0000 | ORAL_TABLET | Freq: Four times a day (QID) | ORAL | Status: DC | PRN
  Administered 2018-10-04 – 2018-10-06 (×4): 2 via ORAL
  Filled 2018-10-04 (×4): qty 2

## 2018-10-04 MED ORDER — HYDRALAZINE HCL 20 MG/ML IJ SOLN: 10.0000 mg | INTRAMUSCULAR | Status: DC | PRN

## 2018-10-04 MED ORDER — SODIUM CHLORIDE 0.9 % IV BOLUS
1000.0000 mL | Freq: Once | INTRAVENOUS | Status: AC
  Administered 2018-10-04: 04:00:00 1000 mL via INTRAVENOUS

## 2018-10-04 MED ORDER — LEVALBUTEROL TARTRATE 45 MCG/ACT IN AERO: 1.0000 | INHALATION_SPRAY | RESPIRATORY_TRACT | Status: DC | PRN

## 2018-10-04 NOTE — ED Provider Notes (Signed)
78 year old female received a signout from [facility] pending CT A/P. Per her HPI:   "NAINIKA NEWLUN is a 78 y.o. female with PMHx asthma, diverticulosis, HTN who presents to the ED complaining of nausea, NBNB emesis, and diarrhea x 2 days. Pt also complains of LLQ abdominal pain that resolved on the first day of her symptoms. She reports trying to keep fluids down but she has not had much of an appetite. No suspicious food intake. No recent abx. Pt reports very occasional alcohol use; states the last time she had any alcohol was 3-4 weeks ago. No known covid 19 positive exposures. Denies fever, chills, hematemesis, hematochezia, melena, dysuria, urinary frequency, flank pain, or any other associated symptoms."   Physical Exam  BP (!) 158/65 (BP Location: Right Arm)   Pulse 84   Temp 97.9 F (36.6 C) (Oral)   Resp 16   Ht 4\' 9"  (1.448 m)   Wt 45.4 kg   SpO2 97%   BMI 21.64 kg/m   Physical Exam Vitals signs and nursing note reviewed.  Constitutional:      General: She is not in acute distress. HENT:     Head: Normocephalic.  Eyes:     Conjunctiva/sclera: Conjunctivae normal.  Neck:     Musculoskeletal: Neck supple.  Cardiovascular:     Rate and Rhythm: Normal rate and regular rhythm.     Heart sounds: No murmur. No friction rub. No gallop.   Pulmonary:     Effort: Pulmonary effort is normal. No respiratory distress.     Breath sounds: No stridor. No wheezing, rhonchi or rales.  Chest:     Chest wall: No tenderness.  Abdominal:     General: There is no distension.     Palpations: Abdomen is soft.  Skin:    General: Skin is warm.     Findings: No rash.  Neurological:     Mental Status: She is alert.  Psychiatric:        Behavior: Behavior normal.     ED Course/Procedures     Procedures  MDM   78 year old female with PMHx asthma, diverticulosis, and HTN received at sign out from [facility] ending CT abdomen pelvis.  Patient was previously seen by Dr. Melina Copa in  conjunction with PA Alroy Bailiff.    Patient presented with 2 days of nausea, vomiting, and diarrhea as well as some generalized abdominal pain that is since resolved.  Labs were notable for mild transaminitis and mild increase in lipase.  She also has a new leukopenia and thrombocytopenia.  CT abdomen pelvis is unremarkable for acute process.  Could consider early pancreatitis versus viral gastroenteritis.  Urine did show significant ketonuria concerning for dehydration secondary to fluid loss after 2 L of IV fluids, the patient ambulated to the bathroom and is still generally feeling weak despite fluid resuscitation.  She has had multiple episodes of diarrhea since arrival in the ER.  I think it would be reasonable to admit the patient for observation given continued fluid loss.  Consulted the hospitalist team and spoke with Dr. Matilde Sprang who will accept the patient for admission. The patient appears reasonably stabilized for admission considering the current resources, flow, and capabilities available in the ED at this time, and I doubt any other [facility] requiring further screening and/or treatment in the ED prior to admission.     Joline Maxcy A, PA-C 10/04/18 2831    Rolland Porter, MD 10/08/18 [id number]

## 2018-10-04 NOTE — ED Notes (Signed)
ED TO INPATIENT HANDOFF REPORT  Name/Age/Gender Tonya [name] 78 y.o. female  Code Status    Code Status Orders  (From admission, onward)         Start     Ordered   10/04/18 0748  Full code  Continuous     10/04/18 0747        Code Status History    This patient has a current code status but no historical code status.   Advance Care Planning Activity    Advance Directive Documentation     Most Recent Value  Type of Advance Directive  Healthcare Power of Attorney, Living will, Out of facility DNR (pink MOST or yellow form)  Pre-existing out of facility DNR order (yellow form or pink MOST form)  -  "MOST" Form in Place?  -      Home/SNF/Other Home  Chief Complaint nausea; emesis; diarrhea  Level of Care/Admitting Diagnosis ED Disposition    ED Disposition Condition [place]: [facility] [100102]  Level of Care: Med-Surg [16]  Covid Evaluation: Confirmed COVID Negative  Diagnosis: Acute gastroenteritis [967591]  Admitting Physician: Vianne Bulls [[id number]]  Attending Physician: Vianne Bulls [[id number]]  PT Class (Do Not Modify): Observation [104]  PT Acc Code (Do Not Modify): Observation [10022]       Medical History Past Medical History:  Diagnosis Date  . Anemia   . Asthma   . Back pain, chronic   . Diverticulosis   . H/O arthrodesis 11/22/2012  . History of hysterectomy   . Hypertension   . Spinal stenosis   . Synovial cyst     Allergies Allergies  Allergen Reactions  . Prochlorperazine Edisylate Other (See Comments)    (aka Compazine) causes facial stiffness  . Other Nausea And Vomiting    [place] (shrimp is okay)    IV Location/Drains/Wounds Patient Lines/Drains/Airways Status   Active Line/Drains/Airways    Name:   Placement date:   Placement time:   Site:   Days:   Peripheral IV 10/03/18 Left Antecubital   10/03/18    2258    Antecubital   1          Labs/Imaging Results for  orders placed or performed during the hospital encounter of 10/03/18 (from the past 48 hour(s))  Lipase, blood     Status: Abnormal   Collection Time: 10/03/18  5:06 PM  Result Value Ref Range   Lipase 118 (H) 11 - 51 U/L    Comment: Performed at [facility], [place] [address], [place], [place] [address]  Comprehensive metabolic panel     Status: Abnormal   Collection Time: 10/03/18  5:06 PM  Result Value Ref Range   Sodium 133 (L) 135 - 145 mmol/L   Potassium 3.9 3.5 - 5.1 mmol/L   Chloride 96 (L) 98 - 111 mmol/L   CO2 23 22 - 32 mmol/L   Glucose, Bld 93 70 - 99 mg/dL   BUN 16 8 - 23 mg/dL   Creatinine, Ser 0.71 0.44 - 1.00 mg/dL   Calcium 8.7 (L) 8.9 - 10.3 mg/dL   Total Protein 6.6 6.5 - 8.1 g/dL   Albumin 3.7 3.5 - 5.0 g/dL   AST 98 (H) 15 - 41 U/L   ALT 83 (H) 0 - 44 U/L   Alkaline Phosphatase 113 38 - 126 U/L   Total Bilirubin 0.6 0.3 - 1.2 mg/dL   GFR calc non Af Amer >  60 >60 mL/min   GFR calc Af Amer >60 >60 mL/min   Anion gap 14 5 - 15    Comment: Performed at [facility], [place] [address], [place], [place] [address]  CBC     Status: Abnormal   Collection Time: 10/03/18  5:06 PM  Result Value Ref Range   WBC 3.0 (L) 4.0 - 10.5 K/uL   RBC 4.47 3.87 - 5.11 MIL/uL   Hemoglobin 13.5 12.0 - 15.0 g/dL   HCT 41.4 36.0 - 46.0 %   MCV 92.6 80.0 - 100.0 fL   MCH 30.2 26.0 - 34.0 pg   MCHC 32.6 30.0 - 36.0 g/dL   RDW 12.8 11.5 - 15.5 %   Platelets 80 (L) 150 - 400 K/uL    Comment: REPEATED TO VERIFY PLATELET COUNT CONFIRMED BY SMEAR SPECIMEN CHECKED FOR CLOTS Immature Platelet Fraction may be clinically indicated, consider ordering this additional test [id number]    nRBC 0.0 0.0 - 0.2 %    Comment: Performed at [facility] [place] [address], [place], [place] [address]  CK     Status: None   Collection Time: 10/03/18  5:06 PM  Result Value Ref Range   Total CK 120 38 - 234 U/L    Comment: Performed at [facility], [place] [address] [place], [place] [address]  Urinalysis, Routine w reflex microscopic     Status: Abnormal   Collection Time: 10/03/18 10:47 PM  Result Value Ref Range   Color, Urine YELLOW YELLOW   APPearance HAZY (A) CLEAR   Specific Gravity, Urine 1.023 1.005 - 1.030   pH 6.0 5.0 - 8.0   Glucose, UA NEGATIVE NEGATIVE mg/dL   Hgb urine dipstick NEGATIVE NEGATIVE   Bilirubin Urine NEGATIVE NEGATIVE   Ketones, ur 80 (A) NEGATIVE mg/dL   Protein, ur 100 (A) NEGATIVE mg/dL   Nitrite NEGATIVE NEGATIVE   Leukocytes,Ua NEGATIVE NEGATIVE   RBC / HPF 0-5 0 - 5 RBC/hpf   WBC, UA 0-5 0 - 5 WBC/hpf   Bacteria, UA NONE SEEN NONE SEEN   Squamous Epithelial / LPF 0-5 0 - 5   Mucus PRESENT    Hyaline Casts, UA PRESENT    Amorphous Crystal PRESENT     Comment: Performed at [facility], [place] [address] [place], [place] [address]  SARS Coronavirus 2 (CEPHEID- Performed in [place] hospital lab), Hosp Order     Status: None   Collection Time: 10/03/18 10:47 PM   Specimen: Nasopharyngeal Swab  Result Value Ref Range   SARS Coronavirus 2 NEGATIVE NEGATIVE    Comment: (NOTE) If result is NEGATIVE SARS-CoV-2 target nucleic acids are NOT DETECTED. The SARS-CoV-2 RNA is generally detectable in upper and lower  respiratory specimens during the acute phase of infection. The lowest  concentration of SARS-CoV-2 viral copies this assay can detect is 250  copies / mL. A negative result does not preclude SARS-CoV-2 infection  and should not be used as the sole basis for treatment or other  patient management decisions.  A negative result may occur with  improper specimen collection / handling, submission of specimen other  than nasopharyngeal swab, presence of viral mutation(s) within the  areas targeted by this assay, and inadequate number of viral copies  (<250 copies / mL). A negative result must be combined with clinical  observations, patient history, and  epidemiological information. If result is POSITIVE SARS-CoV-2 target nucleic acids are DETECTED. The SARS-CoV-2 RNA  is generally detectable in upper and lower  respiratory specimens dur ing the acute phase of infection.  Positive  results are indicative of active infection with SARS-CoV-2.  Clinical  correlation with patient history and other diagnostic information is  necessary to determine patient infection status.  Positive results do  not rule out bacterial infection or co-infection with other viruses. If result is PRESUMPTIVE POSTIVE SARS-CoV-2 nucleic acids MAY BE PRESENT.   A presumptive positive result was obtained on the submitted specimen  and confirmed on repeat testing.  While 2019 novel coronavirus  (SARS-CoV-2) nucleic acids may be present in the submitted sample  additional confirmatory testing may be necessary for epidemiological  and / or clinical management purposes  to differentiate between  SARS-CoV-2 and other Sarbecovirus currently known to infect humans.  If clinically indicated additional testing with an alternate test  methodology [phone]) is advised. The SARS-CoV-2 RNA is generally  detectable in upper and lower respiratory sp ecimens during the acute  phase of infection. The expected result is Negative. Fact Sheet for Patients:  StrictlyIdeas.no Fact Sheet for Healthcare Providers: BankingDealers.co.za This test is not yet approved or cleared by the [place] FDA and has been authorized for detection and/or diagnosis of SARS-CoV-2 by FDA under an Emergency Use Authorization (EUA).  This EUA will remain in effect (meaning this test can be used) for the duration of the COVID-19 declaration under Section 564(b)(1) of the Act, 21 U.S.C. section 360bbb-3(b)(1), unless the authorization is terminated or revoked sooner. Performed at [facility], [place] [address] [place], [place] [address]     Ct Abdomen Pelvis W Contrast  Result Date: 10/04/2018 CLINICAL DATA:  Nausea/vomiting/diarrhea x4 days, elevated lipase EXAM: CT ABDOMEN AND PELVIS WITH CONTRAST TECHNIQUE: Multidetector CT imaging of the abdomen and pelvis was performed using the standard protocol following bolus administration of intravenous contrast. CONTRAST:  60mL OMNIPAQUE IOHEXOL 300 MG/ML  SOLN COMPARISON:  12/08/2016 FINDINGS: Lower chest: Lung bases are clear. Hepatobiliary: Liver is within normal limits. Gallbladder is underdistended. No intrahepatic or extrahepatic ductal dilatation. Pancreas: Within normal limits. No pancreatic fluid or inflammatory changes. Spleen: Within normal limits. Adrenals/Urinary Tract: Adrenal glands are within normal limits. Kidneys are within normal limits.  No hydronephrosis. Bladder is within normal limits. Stomach/Bowel: Stomach is within normal limits. No evidence of bowel obstruction. Normal appendix (series 2/image 46). Mild sigmoid diverticulosis, without evidence of diverticulitis. Vascular/Lymphatic: No evidence of abdominal aortic aneurysm. Atherosclerotic calcifications of the abdominal aorta and branch vessels. No suspicious abdominopelvic lymphadenopathy. Reproductive: Status post hysterectomy. Bilateral ovaries are within normal limits. Other: No abdominopelvic ascites. Musculoskeletal: Lumbar dextroscoliosis with degenerative changes. IMPRESSION: No pancreatic inflammatory changes to confirm the diagnosis of acute pancreatitis. No evidence of bowel obstruction.  Normal appendix. Sigmoid diverticulosis, without evidence of diverticulitis. Electronically Signed   By: Julian Hy M.D.   On: 10/04/2018 02:27    Pending Labs Unresulted Labs (From admission, onward)    Start     Ordered   10/05/18 0500  Comprehensive metabolic panel  Tomorrow morning,   R     10/04/18 1222   10/05/18 0500  CBC WITH DIFFERENTIAL  Tomorrow morning,   R     10/04/18 1222   10/05/18 0500  Vitamin  B12  (Anemia Panel (PNL))  Tomorrow morning,   R     10/04/18 1222   10/05/18 0500  Folate  (Anemia Panel (PNL))  Tomorrow morning,   R     10/04/18 1222  10/05/18 0500  Iron and TIBC  (Anemia Panel (PNL))  Tomorrow morning,   R     10/04/18 1222   10/05/18 0500  Ferritin  (Anemia Panel (PNL))  Tomorrow morning,   R     10/04/18 1222   10/05/18 0500  Reticulocytes  (Anemia Panel (PNL))  Tomorrow morning,   R     10/04/18 1222   10/05/18 0500  Magnesium  Tomorrow morning,   R     10/04/18 1222   10/05/18 0500  Hepatitis panel, acute  Tomorrow morning,   R     10/04/18 0520   10/04/18 0550  Gastrointestinal Panel by PCR , Stool  (Gastrointestinal Panel by PCR, Stool)  Once,   STAT     10/04/18 0549   10/04/18 0114  Rocky mtn spotted fvr abs pnl(IgG+IgM)  Once,   STAT     10/04/18 0113          Vitals/Pain Today's Vitals   10/04/18 1054 10/04/18 1055 10/04/18 1130 10/04/18 1530  BP:   (!) 147/83 (!) 161/85  Pulse:   75 76  Resp:    16  Temp:      TempSrc:      SpO2:   96% 99%  Weight:      Height:      PainSc: 0-No pain 0-No pain      Isolation Precautions Enteric precautions (UV disinfection)  Medications Medications  sodium chloride flush (NS) 0.9 % injection 3 mL (3 mLs Intravenous Not Given 10/04/18 1223)  amLODipine (NORVASC) tablet 2.5 mg (has no administration in time range)  irbesartan (AVAPRO) tablet 150 mg (has no administration in time range)  DULoxetine (CYMBALTA) DR capsule 20 mg (has no administration in time range)  levothyroxine (SYNTHROID) tablet 75 mcg (has no administration in time range)  famotidine (PEPCID) tablet 40 mg (has no administration in time range)  fesoterodine (TOVIAZ) tablet 8 mg (has no administration in time range)  mometasone-formoterol (DULERA) 100-5 MCG/ACT inhaler 2 puff (has no administration in time range)  0.9 %  sodium chloride infusion (has no administration in time range)  acetaminophen (TYLENOL) tablet 650 mg (has no  administration in time range)    Or  acetaminophen (TYLENOL) suppository 650 mg (has no administration in time range)  HYDROcodone-acetaminophen (NORCO/VICODIN) 5-325 MG per tablet 1-2 tablet (has no administration in time range)  ondansetron (ZOFRAN) tablet 4 mg (has no administration in time range)    Or  ondansetron (ZOFRAN) injection 4 mg (has no administration in time range)  levalbuterol (XOPENEX) nebulizer solution 0.63 mg (has no administration in time range)  sodium chloride 0.9 % bolus 1,000 mL (0 mLs Intravenous Stopped 10/04/18 0018)  iohexol (OMNIPAQUE) 300 MG/ML solution 30 mL (30 mLs Oral Contrast Given 10/03/18 2339)  sodium chloride (PF) 0.9 % injection (  Given by Other 10/04/18 0710)  iohexol (OMNIPAQUE) 300 MG/ML solution 100 mL (80 mLs Intravenous Contrast Given 10/04/18 0134)  sodium chloride 0.9 % bolus 1,000 mL (0 mLs Intravenous Stopped 10/04/18 0508)  HYDROcodone-acetaminophen (NORCO/VICODIN) 5-325 MG per tablet 2 tablet (2 tablets Oral Given 10/04/18 0507)  loperamide (IMODIUM) capsule 2 mg (2 mg Oral Given 10/04/18 0507)    Mobility walks

## 2018-10-04 NOTE — Progress Notes (Signed)
Pt seen and examined.  She is feeling a little better after the 1L bolus in ED.  Was able to get up to the bathroom this afternoon.  Still looks dehydrated, will rebolus 1.5 L and start NS 0.9% at 75cc/ hr.  Exam is otherwise normal.    She is worried about RMSF and/or salmonella, these are being tested for.    Will add Cdif to stool tests, she is still having diarrhea.    AM labs already ordered.   Kelly Splinter MD  [facility]  pgr [phone] 10/04/2018, 5:28 PM

## 2018-10-04 NOTE — H&P (Signed)
History and Physical    Tonya [name] ATF:[id number] DOB: 1940-10-31 DOA: 10/03/2018  PCP: [name], MD   Patient coming from: Home   Chief Complaint: N/V/D, generalized weakness   HPI: Tonya [name] is a 78 y.o. female with medical history significant for hypertension, hypothyroidism, asthma, and chronic back pain, now presenting to emergency department for evaluation of nausea, vomiting, diarrhea, and generalized weakness.  Patient reports that she developed nausea and vomiting on 10/01/2018, has gone on to have multiple episodes daily as well as multiple episodes of diarrhea each day.  She was experiencing some chills and left lower quadrant discomfort initially, but that has since resolved.  Patient has become progressively weak in general, now to the point where she is having difficulty just sitting up in bed.  Family called her PCP for advice and was directed to the ED. She denies any sick contacts, travel, recent antibiotics, blood in her vomit or stool, cough, or shortness of breath.  ED Course: Upon arrival to the ED, patient is found to be afebrile, saturating well on room air, and with stable blood pressure.  Chemistry panel is notable for mild hyponatremia and mild elevation in transaminases.  CBC features a WBC of 3000 and platelets of 80,000.  Urinalysis notable for ketonuria and proteinuria.  CT of the abdomen and pelvis is negative for pancreatic inflammation, obstruction, appendicitis, or diverticulitis.  Patient was given 2 L normal saline in the ED, remains hemodynamically stable, but continues to be generally weak despite 2 L of IV fluids in the ED and hospitalists are consulted for admission.  Review of Systems:  All other systems reviewed and apart from HPI, are negative.  Past Medical History:  Diagnosis Date   Anemia    Asthma    Back pain, chronic    Diverticulosis    H/O arthrodesis 11/22/2012   History of hysterectomy    Hypertension     Spinal stenosis    Synovial cyst     Past Surgical History:  Procedure Laterality Date   ABDOMINAL HYSTERECTOMY     BACK SURGERY     CYST REMOVAL TRUNK       reports that she has never smoked. She has never used smokeless tobacco. She reports current alcohol use. She reports that she does not use drugs.  Allergies  Allergen Reactions   Prochlorperazine Edisylate Other (See Comments)    (aka Compazine) causes facial stiffness   Other Nausea And Vomiting    Mussells (shrimp is okay)    Family History  Problem Relation Age of Onset   Other Father        carotid artery disease   Heart disease Brother    Alcohol abuse Brother    Breast cancer Neg Hx      Prior to Admission medications   Medication Sig Start Date End Date Taking? Authorizing Provider  amLODipine (NORVASC) 2.5 MG tablet Take 1 tablet (2.5 mg total) by mouth daily. 08/05/18  Yes Burns, Claudina Lick, MD  budesonide-formoterol (SYMBICORT) 80-4.5 MCG/ACT inhaler TAKE 2 PUFFS BY MOUTH TWICE A DAY 04/11/18  Yes Burns, Claudina Lick, MD  Calcium Citrate 250 MG TABS Take 1 tablet by mouth daily.    Yes [provider]  cycloSPORINE (RESTASIS) 0.05 % ophthalmic emulsion Place 1 drop into both eyes 2 (two) times daily.   Yes [provider]  dimenhyDRINATE (DRAMAMINE) 50 MG tablet Take 50 mg by mouth every 8 (eight) hours as needed for nausea.  Yes [provider]  diphenhydrAMINE HCl (BENADRYL ALLERGY PO) Take 1 tablet by mouth daily as needed (sleep).    Yes [provider]  DULoxetine (CYMBALTA) 20 MG capsule TAKE 1 CAPSULE BY MOUTH EVERY DAY 05/11/18  Yes Burns, Claudina Lick, MD  famotidine (PEPCID) 40 MG tablet Take 1 tablet (40 mg total) by mouth daily. 08/24/18  Yes Burns, Claudina Lick, MD  fesoterodine (TOVIAZ) 8 MG TB24 tablet TAKE 1 TABLET (8 MG) DAILY. 09/27/18  Yes Burns, Claudina Lick, MD  HYDROcodone-acetaminophen (NORCO) 10-325 MG tablet Take 2 tablets by mouth 2 (two) times daily. For chronic  lower back and neck pain 08/24/18  Yes Burns, Claudina Lick, MD  Magnesium 125 MG CAPS Take 125 mg by mouth every evening.    Yes [provider]  SYNTHROID 75 MCG tablet TAKE 1 TABLET BY MOUTH EVERY DAY 08/25/18  Yes Burns, Claudina Lick, MD  telmisartan (MICARDIS) 40 MG tablet TAKE 1 TABLET BY MOUTH EVERY DAY 08/12/18  Yes Burns, Claudina Lick, MD  vitamin C (ASCORBIC ACID) 500 MG tablet Take 500 mg by mouth daily.   Yes [provider]  vitamin E 100 UNIT capsule Take 100 Units by mouth daily.   Yes [provider]  levalbuterol (XOPENEX HFA) 45 MCG/ACT inhaler Inhale 1-2 puffs into the lungs every 4 (four) hours as needed for wheezing. 04/11/18   [name], MD    Physical Exam: Vitals:   10/04/18 0200 10/04/18 0230 10/04/18 0450 10/04/18 0500  BP: (!) 148/74 (!) 154/80 (!) 166/87 (!) 162/90  Pulse:  78 73 71  Resp:  16 18 16   Temp:      TempSrc:      SpO2:  98% 96% 100%  Weight:      Height:        Constitutional: NAD, appears uncomfortable  Eyes: PERTLA, lids and conjunctivae normal ENMT: Mucous membranes are moist. Posterior pharynx clear of any exudate or lesions.   Neck: normal, supple, no masses, no thyromegaly Respiratory: clear to auscultation bilaterally, no wheezing, no crackles. No accessory muscle use.  Cardiovascular: S1 & S2 heard, regular rate and rhythm. No extremity edema.  Abdomen: No distension, soft, non-tender. Bowel sounds active.  Musculoskeletal: no clubbing / cyanosis. No joint deformity upper and lower extremities.    Skin: no significant rashes, lesions, ulcers. Warm, dry, well-perfused. Neurologic: CN 2-12 grossly intact. Sensation intact. Strength 5/5 in all 4 limbs.  Psychiatric: Alert and oriented x 3. Calm, cooperative.    Labs on Admission: I have personally reviewed following labs and imaging studies  CBC: Recent Labs  Lab 10/03/18 1706  WBC 3.0*  HGB 13.5  HCT 41.4  MCV 92.6  PLT 80*   Basic Metabolic Panel: Recent Labs   Lab 10/03/18 1706  NA 133*  K 3.9  CL 96*  CO2 23  GLUCOSE 93  BUN 16  CREATININE 0.71  CALCIUM 8.7*   GFR: Estimated Creatinine Clearance: 36.5 mL/min (by C-G formula based on SCr of 0.71 mg/dL). Liver Function Tests: Recent Labs  Lab 10/03/18 1706  AST 98*  ALT 83*  ALKPHOS 113  BILITOT 0.6  PROT 6.6  ALBUMIN 3.7   Recent Labs  Lab 10/03/18 1706  LIPASE 118*   No results for input(s): AMMONIA in the last 168 hours. Coagulation Profile: No results for input(s): INR, PROTIME in the last 168 hours. Cardiac Enzymes: No results for input(s): CKTOTAL, CKMB, CKMBINDEX, TROPONINI in the last 168 hours. BNP (last 3  results) No results for input(s): PROBNP in the last 8760 hours. HbA1C: No results for input(s): HGBA1C in the last 72 hours. CBG: No results for input(s): GLUCAP in the last 168 hours. Lipid Profile: No results for input(s): CHOL, HDL, LDLCALC, TRIG, CHOLHDL, LDLDIRECT in the last 72 hours. Thyroid Function Tests: No results for input(s): TSH, T4TOTAL, FREET4, T3FREE, THYROIDAB in the last 72 hours. Anemia Panel: No results for input(s): VITAMINB12, FOLATE, FERRITIN, TIBC, IRON, RETICCTPCT in the last 72 hours. Urine analysis:    Component Value Date/Time   COLORURINE YELLOW 10/03/2018 2247   APPEARANCEUR HAZY (A) 10/03/2018 2247   LABSPEC 1.023 10/03/2018 2247   PHURINE 6.0 10/03/2018 2247   GLUCOSEU NEGATIVE 10/03/2018 2247   GLUCOSEU NEGATIVE 10/15/2016 1635   HGBUR NEGATIVE 10/03/2018 2247   BILIRUBINUR NEGATIVE 10/03/2018 2247   KETONESUR 80 (A) 10/03/2018 2247   PROTEINUR 100 (A) 10/03/2018 2247   UROBILINOGEN 0.2 10/15/2016 1635   NITRITE NEGATIVE 10/03/2018 2247   LEUKOCYTESUR NEGATIVE 10/03/2018 2247   Sepsis Labs: @LABRCNTIP (procalcitonin:4,lacticidven:4) ) Recent Results (from the past 240 hour(s))  SARS Coronavirus 2 (CEPHEID- Performed in [place] hospital lab), Hosp Order     Status: None   Collection Time: 10/03/18 10:47 PM    Specimen: Nasopharyngeal Swab  Result Value Ref Range Status   SARS Coronavirus 2 NEGATIVE NEGATIVE Final    Comment: (NOTE) If result is NEGATIVE SARS-CoV-2 target nucleic acids are NOT DETECTED. The SARS-CoV-2 RNA is generally detectable in upper and lower  respiratory specimens during the acute phase of infection. The lowest  concentration of SARS-CoV-2 viral copies this assay can detect is 250  copies / mL. A negative result does not preclude SARS-CoV-2 infection  and should not be used as the sole basis for treatment or other  patient management decisions.  A negative result may occur with  improper specimen collection / handling, submission of specimen other  than nasopharyngeal swab, presence of viral mutation(s) within the  areas targeted by this assay, and inadequate number of viral copies  (<250 copies / mL). A negative result must be combined with clinical  observations, patient history, and epidemiological information. If result is POSITIVE SARS-CoV-2 target nucleic acids are DETECTED. The SARS-CoV-2 RNA is generally detectable in upper and lower  respiratory specimens dur ing the acute phase of infection.  Positive  results are indicative of active infection with SARS-CoV-2.  Clinical  correlation with patient history and other diagnostic information is  necessary to determine patient infection status.  Positive results do  not rule out bacterial infection or co-infection with other viruses. If result is PRESUMPTIVE POSTIVE SARS-CoV-2 nucleic acids MAY BE PRESENT.   A presumptive positive result was obtained on the submitted specimen  and confirmed on repeat testing.  While 2019 novel coronavirus  (SARS-CoV-2) nucleic acids may be present in the submitted sample  additional confirmatory testing may be necessary for epidemiological  and / or clinical management purposes  to differentiate between  SARS-CoV-2 and other Sarbecovirus currently known to infect humans.  If  clinically indicated additional testing with an alternate test  methodology [phone]) is advised. The SARS-CoV-2 RNA is generally  detectable in upper and lower respiratory sp ecimens during the acute  phase of infection. The expected result is Negative. Fact Sheet for Patients:  StrictlyIdeas.no Fact Sheet for Healthcare Providers: BankingDealers.co.za This test is not yet approved or cleared by the [place] FDA and has been authorized for detection and/or diagnosis of SARS-CoV-2 by FDA under  an Emergency Use Authorization (EUA).  This EUA will remain in effect (meaning this test can be used) for the duration of the COVID-19 declaration under Section 564(b)(1) of the Act, 21 U.S.C. section 360bbb-3(b)(1), unless the authorization is terminated or revoked sooner. Performed at [facility], [place] [address] [place], [place] [address]      Radiological Exams on Admission: Ct Abdomen Pelvis W Contrast  Result Date: 10/04/2018 CLINICAL DATA:  Nausea/vomiting/diarrhea x4 days, elevated lipase EXAM: CT ABDOMEN AND PELVIS WITH CONTRAST TECHNIQUE: Multidetector CT imaging of the abdomen and pelvis was performed using the standard protocol following bolus administration of intravenous contrast. CONTRAST:  14mL OMNIPAQUE IOHEXOL 300 MG/ML  SOLN COMPARISON:  12/08/2016 FINDINGS: Lower chest: Lung bases are clear. Hepatobiliary: Liver is within normal limits. Gallbladder is underdistended. No intrahepatic or extrahepatic ductal dilatation. Pancreas: Within normal limits. No pancreatic fluid or inflammatory changes. Spleen: Within normal limits. Adrenals/Urinary Tract: Adrenal glands are within normal limits. Kidneys are within normal limits.  No hydronephrosis. Bladder is within normal limits. Stomach/Bowel: Stomach is within normal limits. No evidence of bowel obstruction. Normal appendix (series 2/image 46). Mild sigmoid  diverticulosis, without evidence of diverticulitis. Vascular/Lymphatic: No evidence of abdominal aortic aneurysm. Atherosclerotic calcifications of the abdominal aorta and branch vessels. No suspicious abdominopelvic lymphadenopathy. Reproductive: Status post hysterectomy. Bilateral ovaries are within normal limits. Other: No abdominopelvic ascites. Musculoskeletal: Lumbar dextroscoliosis with degenerative changes. IMPRESSION: No pancreatic inflammatory changes to confirm the diagnosis of acute pancreatitis. No evidence of bowel obstruction.  Normal appendix. Sigmoid diverticulosis, without evidence of diverticulitis. Electronically Signed   By: Julian Hy M.D.   On: 10/04/2018 02:27    EKG: Not performed.   Assessment/Plan   1. Acute gastroenteritis  - Presents with 3-4 days of N/V/D and has become generally weak to the point where she has difficulty getting out of bed  - CT abd/pelvis with no acute findings, labs with mild hyponatremia, mild transaminase and lipase elevation, and leukopenia and thrombocytopenia  - Continue IVF hydration and antiemetics, monitor electrolytes, check GI pathogen panel, advance diet as she improves    2. Leukopenia; thrombocytopenia  - WBC is 3000 and platelets 80,000 on admission  - Likely secondary to viral infection  - Repeat CBC with diff in am, culture if febrile   3. Hypertension  - BP at goal, continue home regimen as tolerated    4. Asthma  - No cough or wheezing  - Continue ICS/LABA and as-needed SABA    5. Hypothyroidism  - Continue Synthroid     PPE: Mask, face shield  DVT prophylaxis: SCD's  Code Status: Full  Family Communication: Discussed with patient  Consults called: None  Admission status: Observation   Vianne Bulls, MD [facility] Pager [phone]  If 7PM-7AM, please contact night-coverage [email or web address] Password [email or web address]  10/04/2018, 5:18 AM

## 2018-10-05 DIAGNOSIS — Z91013 Allergy to seafood: Secondary | ICD-10-CM | POA: Diagnosis not present

## 2018-10-05 DIAGNOSIS — E860 Dehydration: Secondary | ICD-10-CM | POA: Diagnosis not present

## 2018-10-05 DIAGNOSIS — E039 Hypothyroidism, unspecified: Secondary | ICD-10-CM | POA: Diagnosis present

## 2018-10-05 DIAGNOSIS — D72819 Decreased white blood cell count, unspecified: Secondary | ICD-10-CM | POA: Diagnosis present

## 2018-10-05 DIAGNOSIS — G8929 Other chronic pain: Secondary | ICD-10-CM | POA: Diagnosis present

## 2018-10-05 DIAGNOSIS — D696 Thrombocytopenia, unspecified: Secondary | ICD-10-CM | POA: Diagnosis present

## 2018-10-05 DIAGNOSIS — M8580 Other specified disorders of bone density and structure, unspecified site: Secondary | ICD-10-CM | POA: Diagnosis present

## 2018-10-05 DIAGNOSIS — Z8249 Family history of ischemic heart disease and other diseases of the circulatory system: Secondary | ICD-10-CM | POA: Diagnosis not present

## 2018-10-05 DIAGNOSIS — E871 Hypo-osmolality and hyponatremia: Secondary | ICD-10-CM | POA: Diagnosis present

## 2018-10-05 DIAGNOSIS — Z20828 Contact with and (suspected) exposure to other viral communicable diseases: Secondary | ICD-10-CM | POA: Diagnosis present

## 2018-10-05 DIAGNOSIS — M19049 Primary osteoarthritis, unspecified hand: Secondary | ICD-10-CM | POA: Diagnosis present

## 2018-10-05 DIAGNOSIS — I10 Essential (primary) hypertension: Secondary | ICD-10-CM | POA: Diagnosis not present

## 2018-10-05 DIAGNOSIS — Z7951 Long term (current) use of inhaled steroids: Secondary | ICD-10-CM | POA: Diagnosis not present

## 2018-10-05 DIAGNOSIS — B349 Viral infection, unspecified: Secondary | ICD-10-CM | POA: Diagnosis present

## 2018-10-05 DIAGNOSIS — Z79890 Hormone replacement therapy (postmenopausal): Secondary | ICD-10-CM | POA: Diagnosis not present

## 2018-10-05 DIAGNOSIS — Z888 Allergy status to other drugs, medicaments and biological substances status: Secondary | ICD-10-CM | POA: Diagnosis not present

## 2018-10-05 DIAGNOSIS — J45909 Unspecified asthma, uncomplicated: Secondary | ICD-10-CM | POA: Diagnosis present

## 2018-10-05 DIAGNOSIS — M549 Dorsalgia, unspecified: Secondary | ICD-10-CM | POA: Diagnosis present

## 2018-10-05 DIAGNOSIS — Z811 Family history of alcohol abuse and dependence: Secondary | ICD-10-CM | POA: Diagnosis not present

## 2018-10-05 DIAGNOSIS — K5790 Diverticulosis of intestine, part unspecified, without perforation or abscess without bleeding: Secondary | ICD-10-CM | POA: Diagnosis present

## 2018-10-05 DIAGNOSIS — K529 Noninfective gastroenteritis and colitis, unspecified: Secondary | ICD-10-CM | POA: Diagnosis not present

## 2018-10-05 LAB — CBC WITH DIFFERENTIAL/PLATELET
Abs Immature Granulocytes: 0.02 10*3/uL (ref 0.00–0.07)
Basophils Absolute: 0.1 10*3/uL (ref 0.0–0.1)
Basophils Relative: 1 %
Eosinophils Absolute: 0 10*3/uL (ref 0.0–0.5)
Eosinophils Relative: 1 %
HCT: 36.6 % (ref 36.0–46.0)
Hemoglobin: 11.7 g/dL — ABNORMAL LOW (ref 12.0–15.0)
Immature Granulocytes: 0 %
Lymphocytes Relative: 60 %
Lymphs Abs: 3.4 10*3/uL (ref 0.7–4.0)
MCH: 29.7 pg (ref 26.0–34.0)
MCHC: 32 g/dL (ref 30.0–36.0)
MCV: 92.9 fL (ref 80.0–100.0)
Monocytes Absolute: 0.5 10*3/uL (ref 0.1–1.0)
Monocytes Relative: 9 %
Neutro Abs: 1.6 10*3/uL — ABNORMAL LOW (ref 1.7–7.7)
Neutrophils Relative %: 29 %
Platelets: 98 10*3/uL — ABNORMAL LOW (ref 150–400)
RBC: 3.94 MIL/uL (ref 3.87–5.11)
RDW: 13 % (ref 11.5–15.5)
WBC: 5.6 10*3/uL (ref 4.0–10.5)
nRBC: 0 % (ref 0.0–0.2)

## 2018-10-05 LAB — COMPREHENSIVE METABOLIC PANEL
ALT: 64 U/L — ABNORMAL HIGH (ref 0–44)
AST: 68 U/L — ABNORMAL HIGH (ref 15–41)
Albumin: 3.2 g/dL — ABNORMAL LOW (ref 3.5–5.0)
Alkaline Phosphatase: 94 U/L (ref 38–126)
Anion gap: 9 (ref 5–15)
BUN: 9 mg/dL (ref 8–23)
CO2: 21 mmol/L — ABNORMAL LOW (ref 22–32)
Calcium: 7.8 mg/dL — ABNORMAL LOW (ref 8.9–10.3)
Chloride: 108 mmol/L (ref 98–111)
Creatinine, Ser: 0.64 mg/dL (ref 0.44–1.00)
GFR calc Af Amer: >60 mL/min (ref >60)
GFR calc non Af Amer: >60 mL/min (ref >60)
Glucose, Bld: 83 mg/dL (ref 70–99)
Potassium: 3.4 mmol/L — ABNORMAL LOW (ref 3.5–5.1)
Sodium: 138 mmol/L (ref 135–145)
Total Bilirubin: 0.5 mg/dL (ref 0.3–1.2)
Total Protein: 5.7 g/dL — ABNORMAL LOW (ref 6.5–8.1)

## 2018-10-05 LAB — IRON AND TIBC
Iron: 37 ug/dL (ref 28–170)
Saturation Ratios: 14 % (ref 10.4–31.8)
TIBC: 267 ug/dL (ref 250–450)
UIBC: 230 ug/dL

## 2018-10-05 LAB — MAGNESIUM: Magnesium: 2.2 mg/dL (ref 1.7–2.4)

## 2018-10-05 LAB — RETICULOCYTES
Immature Retic Fract: 6.5 % (ref 2.3–15.9)
RBC.: 3.94 MIL/uL (ref 3.87–5.11)
Retic Count, Absolute: 17.3 10*3/uL — ABNORMAL LOW (ref 19.0–186.0)
Retic Ct Pct: 0.4 % (ref 0.4–3.1)

## 2018-10-05 LAB — VITAMIN B12: Vitamin B-12: 780 pg/mL (ref 180–914)

## 2018-10-05 LAB — FERRITIN: Ferritin: 449 ng/mL — ABNORMAL HIGH (ref 11–307)

## 2018-10-05 LAB — FOLATE: Folate: 17.7 ng/mL (ref >5.9)

## 2018-10-05 NOTE — Progress Notes (Signed)
PROGRESS NOTE    Tonya [name]  DXA:[id number] DOB: [birth year] DOA: 10/03/2018 PCP: [name], MD    Brief Narrative:  78 y.o. female with medical history significant for hypertension, hypothyroidism, asthma, and chronic back pain, now presenting to emergency department for evaluation of nausea, vomiting, diarrhea, and generalized weakness.  Patient reports that she developed nausea and vomiting on 10/01/2018, has gone on to have multiple episodes daily as well as multiple episodes of diarrhea each day.  She was experiencing some chills and left lower quadrant discomfort initially, but that has since resolved.  Patient has become progressively weak in general, now to the point where she is having difficulty just sitting up in bed.  Family called her PCP for advice and was directed to the ED. She denies any sick contacts, travel, recent antibiotics, blood in her vomit or stool, cough, or shortness of breath.  ED Course: Upon arrival to the ED, patient is found to be afebrile, saturating well on room air, and with stable blood pressure.  Chemistry panel is notable for mild hyponatremia and mild elevation in transaminases.  CBC features a WBC of 3000 and platelets of 80,000.  Urinalysis notable for ketonuria and proteinuria.  CT of the abdomen and pelvis is negative for pancreatic inflammation, obstruction, appendicitis, or diverticulitis.  Patient was given 2 L normal saline in the ED, remains hemodynamically stable, but continues to be generally weak despite 2 L of IV fluids in the ED and hospitalists are consulted for admission.   Assessment & Plan:   Principal Problem:   Acute gastroenteritis Active Problems:   Asthma   Hypothyroidism   Hypertension   Leukopenia   Thrombocytopenia ([place])  1. Acute gastroenteritis  - Presents with 3-4 days of N/V/D and has become generally weak to the point where she has difficulty getting out of bed  - CT abd/pelvis with no acute findings, labs  with mild hyponatremia, mild transaminase and lipase elevation, and leukopenia and thrombocytopenia  - Diarrhea has resolved. No stool this AM for stool study. Very unlikely Cdiff -Advance diet as tolerated. Pt reports feeling better today  2. Leukopenia; thrombocytopenia  - WBC is 3000 and platelets 80,000 on admission  - Likely secondary to viral infection  - repeat cbc in AM  3. Hypertension  - BP at goal, continue home regimen as tolerated   -Stable at present  4. Asthma  - No cough or wheezing  - Continue ICS/LABA and as-needed SABA as tolerated  5. Hypothyroidism  - Continue Synthroid as tolerated  DVT prophylaxis: SCD's Code Status: Full Family Communication: Pt in room, family not at bedside Disposition Plan: Possible home in 24hrs if stable  Consultants:     Procedures:     Antimicrobials: Anti-infectives (From admission, onward)   None       Subjective: Reports feelnig better  Objective: Vitals:   10/04/18 2202 10/05/18 0635 10/05/18 0908 10/05/18 1500  BP: (!) 171/83 (!) 148/83  140/76  Pulse: 84 72  82  Resp: 18 16  16   Temp: 97.7 F (36.5 C) 98 F (36.7 C)  97.8 F (36.6 C)  TempSrc: Oral Oral  Oral  SpO2: 98% 98% 96% 96%  Weight:  45.5 kg    Height:        Intake/Output Summary (Last 24 hours) at 10/05/2018 1905 Last data filed at 10/05/2018 0443 Gross per 24 hour  Intake 1332.23 ml  Output --  Net 1332.23 ml   [employer]  10/03/18 1647 10/05/18 0635  Weight: 45.4 kg 45.5 kg    Examination:  General exam: Appears calm and comfortable  Respiratory system: Clear to auscultation. Respiratory effort normal. Cardiovascular system: S1 & S2 heard, RRR Gastrointestinal system: Abdomen is nondistended, soft and nontender. No organomegaly or masses felt. Central nervous system: Alert and oriented. No focal neurological deficits. Extremities: Symmetric 5 x 5 power. Skin: No rashes, lesions Psychiatry: Judgement and insight  appear normal. Mood & affect appropriate.   Data Reviewed: I have personally reviewed following labs and imaging studies  CBC: Recent Labs  Lab 10/03/18 1706 10/05/18 0701  WBC 3.0* 5.6  NEUTROABS  --  1.6*  HGB 13.5 11.7*  HCT 41.4 36.6  MCV 92.6 92.9  PLT 80* 98*   Basic Metabolic Panel: Recent Labs  Lab 10/03/18 1706 10/05/18 0701  NA 133* 138  K 3.9 3.4*  CL 96* 108  CO2 23 21*  GLUCOSE 93 83  BUN 16 9  CREATININE 0.71 0.64  CALCIUM 8.7* 7.8*  MG  --  2.2   GFR: Estimated Creatinine Clearance: 36.5 mL/min (by C-G formula based on SCr of 0.64 mg/dL). Liver Function Tests: Recent Labs  Lab 10/03/18 1706 10/05/18 0701  AST 98* 68*  ALT 83* 64*  ALKPHOS 113 94  BILITOT 0.6 0.5  PROT 6.6 5.7*  ALBUMIN 3.7 3.2*   Recent Labs  Lab 10/03/18 1706  LIPASE 118*   No results for input(s): AMMONIA in the last 168 hours. Coagulation Profile: No results for input(s): INR, PROTIME in the last 168 hours. Cardiac Enzymes: Recent Labs  Lab 10/03/18 1706  CKTOTAL 120   BNP (last 3 results) No results for input(s): PROBNP in the last 8760 hours. HbA1C: No results for input(s): HGBA1C in the last 72 hours. CBG: No results for input(s): GLUCAP in the last 168 hours. Lipid Profile: No results for input(s): CHOL, HDL, LDLCALC, TRIG, CHOLHDL, LDLDIRECT in the last 72 hours. Thyroid Function Tests: No results for input(s): TSH, T4TOTAL, FREET4, T3FREE, THYROIDAB in the last 72 hours. Anemia Panel: Recent Labs    10/05/18 0701  VITAMINB12 780  FOLATE 17.7  FERRITIN 449*  TIBC 267  IRON 37  RETICCTPCT 0.4   Sepsis Labs: No results for input(s): PROCALCITON, LATICACIDVEN in the last 168 hours.  Recent Results (from the past 240 hour(s))  SARS Coronavirus 2 (CEPHEID- Performed in [place] hospital lab), Hosp Order     Status: None   Collection Time: 10/03/18 10:47 PM   Specimen: Nasopharyngeal Swab  Result Value Ref Range Status   SARS Coronavirus 2  NEGATIVE NEGATIVE Final    Comment: (NOTE) If result is NEGATIVE SARS-CoV-2 target nucleic acids are NOT DETECTED. The SARS-CoV-2 RNA is generally detectable in upper and lower  respiratory specimens during the acute phase of infection. The lowest  concentration of SARS-CoV-2 viral copies this assay can detect is 250  copies / mL. A negative result does not preclude SARS-CoV-2 infection  and should not be used as the sole basis for treatment or other  patient management decisions.  A negative result may occur with  improper specimen collection / handling, submission of specimen other  than nasopharyngeal swab, presence of viral mutation(s) within the  areas targeted by this assay, and inadequate number of viral copies  (<250 copies / mL). A negative result must be combined with clinical  observations, patient history, and epidemiological information. If result is POSITIVE SARS-CoV-2 target nucleic acids are DETECTED. The SARS-CoV-2 RNA is  generally detectable in upper and lower  respiratory specimens dur ing the acute phase of infection.  Positive  results are indicative of active infection with SARS-CoV-2.  Clinical  correlation with patient history and other diagnostic information is  necessary to determine patient infection status.  Positive results do  not rule out bacterial infection or co-infection with other viruses. If result is PRESUMPTIVE POSTIVE SARS-CoV-2 nucleic acids MAY BE PRESENT.   A presumptive positive result was obtained on the submitted specimen  and confirmed on repeat testing.  While 2019 novel coronavirus  (SARS-CoV-2) nucleic acids may be present in the submitted sample  additional confirmatory testing may be necessary for epidemiological  and / or clinical management purposes  to differentiate between  SARS-CoV-2 and other Sarbecovirus currently known to infect humans.  If clinically indicated additional testing with an alternate test  methodology [phone])  is advised. The SARS-CoV-2 RNA is generally  detectable in upper and lower respiratory sp ecimens during the acute  phase of infection. The expected result is Negative. Fact Sheet for Patients:  StrictlyIdeas.no Fact Sheet for Healthcare Providers: BankingDealers.co.za This test is not yet approved or cleared by the [place] FDA and has been authorized for detection and/or diagnosis of SARS-CoV-2 by FDA under an Emergency Use Authorization (EUA).  This EUA will remain in effect (meaning this test can be used) for the duration of the COVID-19 declaration under Section 564(b)(1) of the Act, 21 U.S.C. section 360bbb-3(b)(1), unless the authorization is terminated or revoked sooner. Performed at [facility], [place] [address], [place], [place] [address]      Radiology Studies: Ct Abdomen Pelvis W Contrast  Result Date: 10/04/2018 CLINICAL DATA:  Nausea/vomiting/diarrhea x4 days, elevated lipase EXAM: CT ABDOMEN AND PELVIS WITH CONTRAST TECHNIQUE: Multidetector CT imaging of the abdomen and pelvis was performed using the standard protocol following bolus administration of intravenous contrast. CONTRAST:  4mL OMNIPAQUE IOHEXOL 300 MG/ML  SOLN COMPARISON:  12/08/2016 FINDINGS: Lower chest: Lung bases are clear. Hepatobiliary: Liver is within normal limits. Gallbladder is underdistended. No intrahepatic or extrahepatic ductal dilatation. Pancreas: Within normal limits. No pancreatic fluid or inflammatory changes. Spleen: Within normal limits. Adrenals/Urinary Tract: Adrenal glands are within normal limits. Kidneys are within normal limits.  No hydronephrosis. Bladder is within normal limits. Stomach/Bowel: Stomach is within normal limits. No evidence of bowel obstruction. Normal appendix (series 2/image 46). Mild sigmoid diverticulosis, without evidence of diverticulitis. Vascular/Lymphatic: No evidence of abdominal aortic aneurysm.  Atherosclerotic calcifications of the abdominal aorta and branch vessels. No suspicious abdominopelvic lymphadenopathy. Reproductive: Status post hysterectomy. Bilateral ovaries are within normal limits. Other: No abdominopelvic ascites. Musculoskeletal: Lumbar dextroscoliosis with degenerative changes. IMPRESSION: No pancreatic inflammatory changes to confirm the diagnosis of acute pancreatitis. No evidence of bowel obstruction.  Normal appendix. Sigmoid diverticulosis, without evidence of diverticulitis. Electronically Signed   By: Julian Hy M.D.   On: 10/04/2018 02:27    Scheduled Meds:  amLODipine  2.5 mg Oral Daily   DULoxetine  20 mg Oral Daily   famotidine  40 mg Oral Daily   fesoterodine  8 mg Oral Daily   irbesartan  150 mg Oral Daily   levothyroxine  75 mcg Oral Q0600   mometasone-formoterol  2 puff Inhalation BID   sodium chloride flush  3 mL Intravenous Once   Continuous Infusions:  sodium chloride 75 mL/hr at 10/05/18 0443     LOS: 0 days   Marylu Lund, MD [facility] Pager On Amion  If 7PM-7AM, please  contact night-coverage 10/05/2018, 7:05 PM

## 2018-10-06 LAB — GASTROINTESTINAL PANEL BY PCR, STOOL (REPLACES STOOL CULTURE)

## 2018-10-06 LAB — BASIC METABOLIC PANEL
Anion gap: 6 (ref 5–15)
BUN: 6 mg/dL — ABNORMAL LOW (ref 8–23)
CO2: 23 mmol/L (ref 22–32)
Calcium: 8 mg/dL — ABNORMAL LOW (ref 8.9–10.3)
Chloride: 110 mmol/L (ref 98–111)
Creatinine, Ser: 0.63 mg/dL (ref 0.44–1.00)
GFR calc Af Amer: >60 mL/min (ref >60)
GFR calc non Af Amer: >60 mL/min (ref >60)
Glucose, Bld: 101 mg/dL — ABNORMAL HIGH (ref 70–99)
Potassium: 3.6 mmol/L (ref 3.5–5.1)
Sodium: 139 mmol/L (ref 135–145)

## 2018-10-06 LAB — CBC
HCT: 34.4 % — ABNORMAL LOW (ref 36.0–46.0)
Hemoglobin: 11.1 g/dL — ABNORMAL LOW (ref 12.0–15.0)
MCH: 30.2 pg (ref 26.0–34.0)
MCHC: 32.3 g/dL (ref 30.0–36.0)
MCV: 93.5 fL (ref 80.0–100.0)
Platelets: 111 10*3/uL — ABNORMAL LOW (ref 150–400)
RBC: 3.68 MIL/uL — ABNORMAL LOW (ref 3.87–5.11)
RDW: 13.1 % (ref 11.5–15.5)
WBC: 7.1 10*3/uL (ref 4.0–10.5)
nRBC: 0 % (ref 0.0–0.2)

## 2018-10-06 LAB — C DIFFICILE QUICK SCREEN W PCR REFLEX
C Diff antigen: NEGATIVE
C Diff interpretation: NOT DETECTED
C Diff toxin: NEGATIVE

## 2018-10-06 LAB — HEPATITIS PANEL, ACUTE
HCV Ab: <0.1 s/co ratio (ref 0.0–0.9)
Hep A IgM: NEGATIVE
Hep B C IgM: NEGATIVE
Hepatitis B Surface Ag: NEGATIVE

## 2018-10-06 LAB — ROCKY MTN SPOTTED FVR ABS PNL(IGG+IGM)
RMSF IgG: POSITIVE — AB
RMSF IgM: 0.66 index (ref 0.00–0.89)

## 2018-10-06 LAB — RMSF, IGG, IFA: RMSF, IGG, IFA: 1:128 {titer} — ABNORMAL HIGH

## 2018-10-06 NOTE — Discharge Summary (Signed)
Physician Discharge Summary  Tonya [name]:[id number] DOB: June 17, 1940 DOA: 10/03/2018  PCP: Tonya Rail, Tonya [name]  Admit date: 10/03/2018 Discharge date: 10/06/2018  Admitted From: Home Disposition:  Home  Recommendations for Outpatient Follow-up:  1. Follow up with PCP in 1-2 weeks  Discharge Condition:Improved CODE STATUS:Full Diet recommendation: Regular as tolerated   Brief/Interim Summary: 78 y.o.femalewith medical history significant forhypertension, hypothyroidism, asthma, and chronic back pain, now presenting to emergency department for evaluation of nausea, vomiting, diarrhea, and generalized weakness. Patient reports that she developed nausea and vomiting on 10/01/2018, has gone on to have multiple episodes daily as well as multiple episodes of diarrhea each day. She was experiencing some chills andleft lower quadrant discomfort initially, but that has since resolved. Patient has become progressively weak in general, now to the point where she is having difficulty just sitting up in bed. Family called her PCP for advice and was directed to the ED. She denies any sick contacts, travel, recent antibiotics, blood in her vomit or stool, cough, or shortness of breath.  ED Course:Upon arrival to the ED, patient is found to be afebrile, saturating well on room air, and with stable blood pressure. Chemistry panel is notable for mild hyponatremia and mild elevation in transaminases. CBC features a WBC of 3000 and platelets of 80,000. Urinalysis notable for ketonuria and proteinuria. CT of the abdomen and pelvis is negative for pancreatic inflammation, obstruction, appendicitis, or diverticulitis. Patient was given 2 L normal saline in the ED, remains hemodynamically stable, but continues to be generally weak despite 2 L of IV fluids in the ED and hospitalists are consulted for admission.  Discharge Diagnoses:  Principal Problem:   Acute gastroenteritis Active Problems:  Asthma   Hypothyroidism   Hypertension   Leukopenia   Thrombocytopenia ([place])  1.Acute gastroenteritis -Presents with 3-4 days of N/V/D and has become generally weak to the point where she has difficulty getting out of bed -CT abd/pelvis with no acute findings, labs with mild hyponatremia, mild transaminase and lipase elevation, and leukopenia and thrombocytopenia -Diarrhea has resolved.  -Stool pathogen panel negative. Cdiff negative  2.Leukopenia; thrombocytopenia -WBC is 3000 and platelets 80,000 on admission -Likely secondary to viral infection -WBC normalized and platelet count trended up towards normal  3.Hypertension -BP at goal, continue home regimen as tolerated - Remains stable  4.Asthma -No cough or wheezing -Continued ICS/LABA and as-needed SABAas tolerated  5.Hypothyroidism -Continued Synthroidas tolerated   Discharge Instructions   Allergies as of 10/06/2018      Reactions   Prochlorperazine Edisylate Other (See Comments)   (aka Compazine) causes facial stiffness   Other Nausea And Vomiting   Mussells (shrimp is okay)      Medication List    TAKE these medications   amLODipine 2.5 MG tablet Commonly known as: NORVASC Take 1 tablet (2.5 mg total) by mouth daily.   BENADRYL ALLERGY PO Take 1 tablet by mouth daily as needed (sleep).   budesonide-formoterol 80-4.5 MCG/ACT inhaler Commonly known as: Symbicort TAKE 2 PUFFS BY MOUTH TWICE A DAY   Calcium Citrate 250 MG Tabs Take 1 tablet by mouth daily.   cycloSPORINE 0.05 % ophthalmic emulsion Commonly known as: RESTASIS Place 1 drop into both eyes 2 (two) times daily.   dimenhyDRINATE 50 MG tablet Commonly known as: DRAMAMINE Take 50 mg by mouth every 8 (eight) hours as needed for nausea.   DULoxetine 20 MG capsule Commonly known as: CYMBALTA TAKE 1 CAPSULE BY MOUTH EVERY DAY   famotidine 40  MG tablet Commonly known as: Pepcid Take 1 tablet (40 mg total)  by mouth daily.   HYDROcodone-acetaminophen 10-325 MG tablet Commonly known as: NORCO Take 2 tablets by mouth 2 (two) times daily. For chronic lower back and neck pain   levalbuterol 45 MCG/ACT inhaler Commonly known as: Xopenex HFA Inhale 1-2 puffs into the lungs every 4 (four) hours as needed for wheezing.   Magnesium 125 MG Caps Take 125 mg by mouth every evening.   Synthroid 75 MCG tablet Generic drug: levothyroxine TAKE 1 TABLET BY MOUTH EVERY DAY   telmisartan 40 MG tablet Commonly known as: MICARDIS TAKE 1 TABLET BY MOUTH EVERY DAY   Toviaz 8 MG Tb24 tablet Generic drug: fesoterodine TAKE 1 TABLET (8 MG) DAILY.   vitamin C 500 MG tablet Commonly known as: ASCORBIC ACID Take 500 mg by mouth daily.   vitamin E 100 UNIT capsule Take 100 Units by mouth daily.      Follow-up Information    Tonya Rail, Tonya [name]. Schedule an appointment as soon as possible for a visit in 1 week(s).   Specialty: Internal Medicine Contact information: [place] [address] [phone]          Allergies  Allergen Reactions  . Prochlorperazine Edisylate Other (See Comments)    (aka Compazine) causes facial stiffness  . Other Nausea And Vomiting    Mussells (shrimp is okay)    Procedures/Studies: Ct Abdomen Pelvis W Contrast  Result Date: 10/04/2018 CLINICAL DATA:  Nausea/vomiting/diarrhea x4 days, elevated lipase EXAM: CT ABDOMEN AND PELVIS WITH CONTRAST TECHNIQUE: Multidetector CT imaging of the abdomen and pelvis was performed using the standard protocol following bolus administration of intravenous contrast. CONTRAST:  39mL OMNIPAQUE IOHEXOL 300 MG/ML  SOLN COMPARISON:  12/08/2016 FINDINGS: Lower chest: Lung bases are clear. Hepatobiliary: Liver is within normal limits. Gallbladder is underdistended. No intrahepatic or extrahepatic ductal dilatation. Pancreas: Within normal limits. No pancreatic fluid or inflammatory changes. Spleen: Within normal limits.  Adrenals/Urinary Tract: Adrenal glands are within normal limits. Kidneys are within normal limits.  No hydronephrosis. Bladder is within normal limits. Stomach/Bowel: Stomach is within normal limits. No evidence of bowel obstruction. Normal appendix (series 2/image 46). Mild sigmoid diverticulosis, without evidence of diverticulitis. Vascular/Lymphatic: No evidence of abdominal aortic aneurysm. Atherosclerotic calcifications of the abdominal aorta and branch vessels. No suspicious abdominopelvic lymphadenopathy. Reproductive: Status post hysterectomy. Bilateral ovaries are within normal limits. Other: No abdominopelvic ascites. Musculoskeletal: Lumbar dextroscoliosis with degenerative changes. IMPRESSION: No pancreatic inflammatory changes to confirm the diagnosis of acute pancreatitis. No evidence of bowel obstruction.  Normal appendix. Sigmoid diverticulosis, without evidence of diverticulitis. Electronically Signed   By: Julian Hy M.D.   On: 10/04/2018 02:27     Subjective: Reports feeling better today, tolerating regular diet  Discharge Exam: Vitals:   10/05/18 2050 10/06/18 1102  BP: (!) 143/84   Pulse: 82   Resp: 17   Temp: 98.4 F (36.9 C)   SpO2: 97% 97%   Vitals:   10/05/18 1950 10/05/18 2050 10/06/18 0700 10/06/18 1102  BP:  (!) 143/84    Pulse:  82    Resp:  17    Temp:  98.4 F (36.9 C)    TempSrc:  Oral    SpO2: 97% 97%  97%  Weight:   44.3 kg   Height:        General: Pt is alert, awake, not in acute distress Cardiovascular: RRR, S1/S2 +, no rubs, no gallops Respiratory: CTA bilaterally,  no wheezing, no rhonchi Abdominal: Soft, NT, ND, bowel sounds + Extremities: no edema, no cyanosis   The results of significant diagnostics from this hospitalization (including imaging, microbiology, ancillary and laboratory) are listed below for reference.     Microbiology: Recent Results (from the past 240 hour(s))  SARS Coronavirus 2 (CEPHEID- Performed in [place] hospital lab), Hosp Order     Status: None   Collection Time: 10/03/18 10:47 PM   Specimen: Nasopharyngeal Swab  Result Value Ref Range Status   SARS Coronavirus 2 NEGATIVE NEGATIVE Final    Comment: (NOTE) If result is NEGATIVE SARS-CoV-2 target nucleic acids are NOT DETECTED. The SARS-CoV-2 RNA is generally detectable in upper and lower  respiratory specimens during the acute phase of infection. The lowest  concentration of SARS-CoV-2 viral copies this assay can detect is 250  copies / mL. A negative result does not preclude SARS-CoV-2 infection  and should not be used as the sole basis for treatment or other  patient management decisions.  A negative result may occur with  improper specimen collection / handling, submission of specimen other  than nasopharyngeal swab, presence of viral mutation(s) within the  areas targeted by this assay, and inadequate number of viral copies  (<250 copies / mL). A negative result must be combined with clinical  observations, patient history, and epidemiological information. If result is POSITIVE SARS-CoV-2 target nucleic acids are DETECTED. The SARS-CoV-2 RNA is generally detectable in upper and lower  respiratory specimens dur ing the acute phase of infection.  Positive  results are indicative of active infection with SARS-CoV-2.  Clinical  correlation with patient history and other diagnostic information is  necessary to determine patient infection status.  Positive results do  not rule out bacterial infection or co-infection with other viruses. If result is PRESUMPTIVE POSTIVE SARS-CoV-2 nucleic acids MAY BE PRESENT.   A presumptive positive result was obtained on the submitted specimen  and confirmed on repeat testing.  While 2019 novel coronavirus  (SARS-CoV-2) nucleic acids may be present in the submitted sample  additional confirmatory testing may be necessary for epidemiological  and / or clinical management purposes  to  differentiate between  SARS-CoV-2 and other Sarbecovirus currently known to infect humans.  If clinically indicated additional testing with an alternate test  methodology [phone]) is advised. The SARS-CoV-2 RNA is generally  detectable in upper and lower respiratory sp ecimens during the acute  phase of infection. The expected result is Negative. Fact Sheet for Patients:  StrictlyIdeas.no Fact Sheet for Healthcare Providers: BankingDealers.co.za This test is not yet approved or cleared by the [place] FDA and has been authorized for detection and/or diagnosis of SARS-CoV-2 by FDA under an Emergency Use Authorization (EUA).  This EUA will remain in effect (meaning this test can be used) for the duration of the COVID-19 declaration under Section 564(b)(1) of the Act, 21 U.S.C. section 360bbb-3(b)(1), unless the authorization is terminated or revoked sooner. Performed at [facility], [place] [address] [place], [place] [address]   Gastrointestinal Panel by PCR , Stool     Status: None   Collection Time: 10/04/18  5:50 AM   Specimen: STOOL  Result Value Ref Range Status   Campylobacter species NOT DETECTED NOT DETECTED Final   Plesimonas shigelloides NOT DETECTED NOT DETECTED Final   Salmonella species NOT DETECTED NOT DETECTED Final   Yersinia enterocolitica NOT DETECTED NOT DETECTED Final   Vibrio species NOT DETECTED NOT DETECTED Final   Vibrio cholerae  NOT DETECTED NOT DETECTED Final   Enteroaggregative E coli (EAEC) NOT DETECTED NOT DETECTED Final   Enteropathogenic E coli (EPEC) NOT DETECTED NOT DETECTED Final   Enterotoxigenic E coli (ETEC) NOT DETECTED NOT DETECTED Final   Shiga like toxin producing E coli (STEC) NOT DETECTED NOT DETECTED Final   Shigella/Enteroinvasive E coli (EIEC) NOT DETECTED NOT DETECTED Final   Cryptosporidium NOT DETECTED NOT DETECTED Final   Cyclospora cayetanensis NOT DETECTED NOT  DETECTED Final   Entamoeba histolytica NOT DETECTED NOT DETECTED Final   Giardia lamblia NOT DETECTED NOT DETECTED Final   Adenovirus F40/41 NOT DETECTED NOT DETECTED Final   Astrovirus NOT DETECTED NOT DETECTED Final   Norovirus GI/GII NOT DETECTED NOT DETECTED Final   Rotavirus A NOT DETECTED NOT DETECTED Final   Sapovirus (I, II, IV, and V) NOT DETECTED NOT DETECTED Final    Comment: Performed at [facility], [address] [place], [place] [address]  C difficile quick scan w PCR reflex     Status: None   Collection Time: 10/04/18  5:25 PM   Specimen: STOOL  Result Value Ref Range Status   C Diff antigen NEGATIVE NEGATIVE Final   C Diff toxin NEGATIVE NEGATIVE Final   C Diff interpretation No C. difficile detected.  Final    Comment: Performed at [facility], [place] [address], [place], [place] [address]     Labs: BNP (last 3 results) No results for input(s): BNP in the last 8760 hours. Basic Metabolic Panel: Recent Labs  Lab 10/03/18 1706 10/05/18 0701 10/06/18 0602  NA 133* 138 139  K 3.9 3.4* 3.6  CL 96* 108 110  CO2 23 21* 23  GLUCOSE 93 83 101*  BUN 16 9 6*  CREATININE 0.71 0.64 0.63  CALCIUM 8.7* 7.8* 8.0*  MG  --  2.2  --    Liver Function Tests: Recent Labs  Lab 10/03/18 1706 10/05/18 0701  AST 98* 68*  ALT 83* 64*  ALKPHOS 113 94  BILITOT 0.6 0.5  PROT 6.6 5.7*  ALBUMIN 3.7 3.2*   Recent Labs  Lab 10/03/18 1706  LIPASE 118*   No results for input(s): AMMONIA in the last 168 hours. CBC: Recent Labs  Lab 10/03/18 1706 10/05/18 0701 10/06/18 0602  WBC 3.0* 5.6 7.1  NEUTROABS  --  1.6*  --   HGB 13.5 11.7* 11.1*  HCT 41.4 36.6 34.4*  MCV 92.6 92.9 93.5  PLT 80* 98* 111*   Cardiac Enzymes: Recent Labs  Lab 10/03/18 1706  CKTOTAL 120   BNP: Invalid input(s): POCBNP CBG: No results for input(s): GLUCAP in the last 168 hours. D-Dimer No results for input(s): DDIMER in the last 72 hours. Hgb A1c No  results for input(s): HGBA1C in the last 72 hours. Lipid Profile No results for input(s): CHOL, HDL, LDLCALC, TRIG, CHOLHDL, LDLDIRECT in the last 72 hours. Thyroid function studies No results for input(s): TSH, T4TOTAL, T3FREE, THYROIDAB in the last 72 hours.  Invalid input(s): FREET3 Anemia work up Recent Labs    10/05/18 0701  VITAMINB12 780  FOLATE 17.7  FERRITIN 449*  TIBC 267  IRON 37  RETICCTPCT 0.4   Urinalysis    Component Value Date/Time   COLORURINE YELLOW 10/03/2018 2247   APPEARANCEUR HAZY (A) 10/03/2018 2247   LABSPEC 1.023 10/03/2018 2247   PHURINE 6.0 10/03/2018 [place] 10/03/2018 2247   GLUCOSEU NEGATIVE 10/15/2016 1635   HGBUR NEGATIVE 10/03/2018 2247   BILIRUBINUR NEGATIVE  10/03/2018 2247   KETONESUR 80 (A) 10/03/2018 2247   PROTEINUR 100 (A) 10/03/2018 2247   UROBILINOGEN 0.2 10/15/2016 1635   NITRITE NEGATIVE 10/03/2018 2247   LEUKOCYTESUR NEGATIVE 10/03/2018 2247   Sepsis Labs Invalid input(s): PROCALCITONIN,  WBC,  LACTICIDVEN Microbiology Recent Results (from the past 240 hour(s))  SARS Coronavirus 2 (CEPHEID- Performed in [place] hospital lab), Hosp Order     Status: None   Collection Time: 10/03/18 10:47 PM   Specimen: Nasopharyngeal Swab  Result Value Ref Range Status   SARS Coronavirus 2 NEGATIVE NEGATIVE Final    Comment: (NOTE) If result is NEGATIVE SARS-CoV-2 target nucleic acids are NOT DETECTED. The SARS-CoV-2 RNA is generally detectable in upper and lower  respiratory specimens during the acute phase of infection. The lowest  concentration of SARS-CoV-2 viral copies this assay can detect is 250  copies / mL. A negative result does not preclude SARS-CoV-2 infection  and should not be used as the sole basis for treatment or other  patient management decisions.  A negative result may occur with  improper specimen collection / handling, submission of specimen other  than nasopharyngeal swab, presence of viral  mutation(s) within the  areas targeted by this assay, and inadequate number of viral copies  (<250 copies / mL). A negative result must be combined with clinical  observations, patient history, and epidemiological information. If result is POSITIVE SARS-CoV-2 target nucleic acids are DETECTED. The SARS-CoV-2 RNA is generally detectable in upper and lower  respiratory specimens dur ing the acute phase of infection.  Positive  results are indicative of active infection with SARS-CoV-2.  Clinical  correlation with patient history and other diagnostic information is  necessary to determine patient infection status.  Positive results do  not rule out bacterial infection or co-infection with other viruses. If result is PRESUMPTIVE POSTIVE SARS-CoV-2 nucleic acids MAY BE PRESENT.   A presumptive positive result was obtained on the submitted specimen  and confirmed on repeat testing.  While 2019 novel coronavirus  (SARS-CoV-2) nucleic acids may be present in the submitted sample  additional confirmatory testing may be necessary for epidemiological  and / or clinical management purposes  to differentiate between  SARS-CoV-2 and other Sarbecovirus currently known to infect humans.  If clinically indicated additional testing with an alternate test  methodology [phone]) is advised. The SARS-CoV-2 RNA is generally  detectable in upper and lower respiratory sp ecimens during the acute  phase of infection. The expected result is Negative. Fact Sheet for Patients:  StrictlyIdeas.no Fact Sheet for Healthcare Providers: BankingDealers.co.za This test is not yet approved or cleared by the [place] FDA and has been authorized for detection and/or diagnosis of SARS-CoV-2 by FDA under an Emergency Use Authorization (EUA).  This EUA will remain in effect (meaning this test can be used) for the duration of the COVID-19 declaration under Section 564(b)(1)  of the Act, 21 U.S.C. section 360bbb-3(b)(1), unless the authorization is terminated or revoked sooner. Performed at [facility], [place] [address], [place], [place] [address]   Gastrointestinal Panel by PCR , Stool     Status: None   Collection Time: 10/04/18  5:50 AM   Specimen: STOOL  Result Value Ref Range Status   Campylobacter species NOT DETECTED NOT DETECTED Final   Plesimonas shigelloides NOT DETECTED NOT DETECTED Final   Salmonella species NOT DETECTED NOT DETECTED Final   Yersinia enterocolitica NOT DETECTED NOT DETECTED Final   Vibrio species NOT DETECTED NOT DETECTED Final  Vibrio cholerae NOT DETECTED NOT DETECTED Final   Enteroaggregative E coli (EAEC) NOT DETECTED NOT DETECTED Final   Enteropathogenic E coli (EPEC) NOT DETECTED NOT DETECTED Final   Enterotoxigenic E coli (ETEC) NOT DETECTED NOT DETECTED Final   Shiga like toxin producing E coli (STEC) NOT DETECTED NOT DETECTED Final   Shigella/Enteroinvasive E coli (EIEC) NOT DETECTED NOT DETECTED Final   Cryptosporidium NOT DETECTED NOT DETECTED Final   Cyclospora cayetanensis NOT DETECTED NOT DETECTED Final   Entamoeba histolytica NOT DETECTED NOT DETECTED Final   Giardia lamblia NOT DETECTED NOT DETECTED Final   Adenovirus F40/41 NOT DETECTED NOT DETECTED Final   Astrovirus NOT DETECTED NOT DETECTED Final   Norovirus GI/GII NOT DETECTED NOT DETECTED Final   Rotavirus A NOT DETECTED NOT DETECTED Final   Sapovirus (I, II, IV, and V) NOT DETECTED NOT DETECTED Final    Comment: Performed at [facility], [place] [address] [place], [place] [address]  C difficile quick scan w PCR reflex     Status: None   Collection Time: 10/04/18  5:25 PM   Specimen: STOOL  Result Value Ref Range Status   C Diff antigen NEGATIVE NEGATIVE Final   C Diff toxin NEGATIVE NEGATIVE Final   C Diff interpretation No C. difficile detected.  Final    Comment: Performed at [facility], [place]  [address], [place], [place] [address]   Time spent: 35min  SIGNED:   Marylu Lund, Tonya [name]  [facility] 10/06/2018, 2:15 PM  If 7PM-7AM, please contact night-coverage

## 2018-10-07 ENCOUNTER — Telehealth: Payer: Self-pay | Admitting: *Deleted

## 2018-10-07 NOTE — Telephone Encounter (Signed)
Transition Care Management Follow-up Telephone Call   Date discharged? 10/06/18   How have you been since you were released from the hospital? Pt states she is doing alright. Just got out the shower   Do you understand why you were in the hospital? YES   Do you understand the discharge instructions? YES   Where were you discharged to? Home   Items Reviewed:  Medications reviewed: YES,pt states there was no changes on her medications  Allergies reviewed: YES  Dietary changes reviewed: YES  Referrals reviewed: No referral recommeded   Functional Questionnaire:   Activities of Daily Living (ADLs):   She states she are independent in the following: ambulation, bathing and hygiene, feeding, continence, grooming, toileting and dressing States she doesn't require assistance she's able to get around   Any transportation issues/concerns?: NO   Any patient concerns? NO   Confirmed importance and date/time of follow-up visits scheduled YES, appt 10/18/18  Provider Appointment booked with Dr. Quay Burow  Confirmed with patient if condition begins to worsen call PCP or go to the ER.  Patient was given the office number and encouraged to call back with question or concerns.  : YES

## 2018-10-10 ENCOUNTER — Telehealth: Payer: Self-pay | Admitting: Internal Medicine

## 2018-10-10 DIAGNOSIS — G8929 Other chronic pain: Secondary | ICD-10-CM

## 2018-10-10 DIAGNOSIS — M5440 Lumbago with sciatica, unspecified side: Secondary | ICD-10-CM

## 2018-10-10 MED ORDER — HYDROCODONE-ACETAMINOPHEN 10-325 MG PO TABS: 2.0000 | ORAL_TABLET | Freq: Two times a day (BID) | ORAL | 0 refills | Status: DC

## 2018-10-10 NOTE — Telephone Encounter (Signed)
Pt request refill  HYDROcodone-acetaminophen ([place]) 10-325 MG tablet    [facility] [place], [place] - [address] [phone] (Phone) [phone] (Fax)

## 2018-10-10 NOTE — Telephone Encounter (Signed)
Last refill was 08/29/18 Last OV 08/24/18 Next OV 10/24/18

## 2018-10-17 NOTE — Patient Instructions (Addendum)
  Medications reviewed and updated.  Changes include :   Stop amlodipine and start bystolic 2.5 mg daily.  Your prescription(s) have been submitted to your pharmacy. Please take as directed and contact our office if you believe you are having problem(s) with the medication(s).

## 2018-10-17 NOTE — Progress Notes (Signed)
Subjective:    Patient ID: Tonya [name], female    DOB: 1940-08-03, 78 y.o.   MRN: [id number]  HPI The patient is here for follow up from the hospital.   Admitted 10/03/2018-10/06/2018.  Diagnosis acute gastroenteritis  She went to the emergency room for nausea, vomiting, diarrhea and generalized weakness.  Her symptoms started 2 days prior.  She had multiple episodes of vomiting and diarrhea daily.  She was experiencing chills, generalized weakness and initially had some left lower quadrant discomfort.  She was so weak the day she went to the ED she was having difficulty sitting up.  She denied any sick contacts, travel, recent antibiotics, blood in her stool or vomit, cough and shortness of breath.  In the ED she was afebrile and vitals were stable.  She had mild hyponatremia, mild elevation of LFTs, WBC 3000, platelets 80,000.  UA showed ketonuria and proteinuria.  CT of the abdomen and pelvis was unremarkable.  She received 2 L normal saline, but continued to remain weak and was admitted for gastroenteritis.  Acute gastroenteritis: CT abdomen pelvis-unremarkable, mild hyponatremia, mild LFT elevation, mild leukopenia and thrombocytopenia Diarrhea resolved during her hospital stay Stool pathogen panel negative, C. difficile negative She had diarrhea the end of last week and took one dose of imodium and her stools have been solid since.   She denies abdominal pain, but her epigastric region just does not feel right.  She is burping when eating which is new She denies nausea and fever She is eating an drinking normally.  She is drinking a lot of Gatorade.    Leukopenia, thrombocytopenia: In ED WBC 3000, platelets 80,000 Likely secondary to viral infection WBC normalized, platelet count trended toward normal  Hypertension:   BP at goal Continue home regimen It is elevated here and she thinks it was rushing to get here  Asthma: No cough, wheeze or shortness of breath  Continued inhalers as per home regimen  Hypothyroidism: Continued on Synthroid    urinary frequency:  She is getting up every two hours to urinate.  It has not improved.  This started when she started the amlodipine and she feels it is that medication.  There was no improvement after decreasing the dose. She wonders if we can try a different medication.   GERD:  When she is eating she is burping.  This is new since the GI illness.  She has some GERD and has used Tums, which is not new.  She does get reflux if she bends over.  She takes the pepcid daily.    Medications and allergies reviewed with patient and updated if appropriate.  Patient Active Problem List   Diagnosis Date Noted  . Leukopenia 10/04/2018  . Thrombocytopenia ([place]) 10/04/2018  . Acute gastroenteritis 10/04/2018  . Dehydration   . Nausea vomiting and diarrhea   . Arthralgia 08/24/2018  . Hypertension 08/23/2018  . DOE (dyspnea on exertion) 01/07/2018  . BRBPR (bright red blood per rectum) 01/07/2018  . Acute pain of right shoulder 01/07/2018  . Pain management contract signed, 01/07/18 01/07/2018  . Osteoarthritis of hand 07/05/2017  . Gross hematuria 10/15/2016  . Prediabetes 09/21/2016  . Encounter for chronic pain management 05/27/2016  . Fatigue 01/15/2016  . Chronic lower back pain 01/15/2016  . Osteopenia, high FRAX 01/14/2016  . DDD (degenerative disc disease), cervical S/P Decompressive laminectomy and Fusion 11/15/2012  . Asthma 11/15/2012  . Hypothyroidism 11/15/2012  . Depression 11/15/2012  . Constipation 11/15/2012  .  Overactive bladder 11/15/2012  . Anxiety 11/15/2012  . Cervical spinal stenosis 10/24/2012  . Fracture of cervical vertebra ([place]) 08/31/2012  . Scoliosis 07/05/2012  . Degeneration of intervertebral disc of lumbosacral region 07/04/2012    Current Outpatient Medications on File Prior to Visit  Medication Sig Dispense Refill  . amLODipine (NORVASC) 2.5 MG tablet Take 1 tablet  (2.5 mg total) by mouth daily. 90 tablet 0  . budesonide-formoterol (SYMBICORT) 80-4.5 MCG/ACT inhaler TAKE 2 PUFFS BY MOUTH TWICE A DAY 30.6 Inhaler 5  . Calcium Citrate 250 MG TABS Take 1 tablet by mouth daily.     . cycloSPORINE (RESTASIS) 0.05 % ophthalmic emulsion Place 1 drop into both eyes 2 (two) times daily.    Marland Kitchen dimenhyDRINATE (DRAMAMINE) 50 MG tablet Take 50 mg by mouth every 8 (eight) hours as needed for nausea.    . diphenhydrAMINE HCl (BENADRYL ALLERGY PO) Take 1 tablet by mouth daily as needed (sleep).     . DULoxetine (CYMBALTA) 20 MG capsule TAKE 1 CAPSULE BY MOUTH EVERY DAY 90 capsule 1  . famotidine (PEPCID) 40 MG tablet Take 1 tablet (40 mg total) by mouth daily. 90 tablet 1  . fesoterodine (TOVIAZ) 8 MG TB24 tablet TAKE 1 TABLET (8 MG) DAILY. 30 tablet 2  . HYDROcodone-acetaminophen (NORCO) 10-325 MG tablet Take 2 tablets by mouth 2 (two) times daily. For chronic lower back and neck pain 120 tablet 0  . levalbuterol (XOPENEX HFA) 45 MCG/ACT inhaler Inhale 1-2 puffs into the lungs every 4 (four) hours as needed for wheezing. 1 Inhaler 8  . Magnesium 125 MG CAPS Take 125 mg by mouth every evening.     Marland Kitchen SYNTHROID 75 MCG tablet TAKE 1 TABLET BY MOUTH EVERY DAY 30 tablet 2  . telmisartan (MICARDIS) 40 MG tablet TAKE 1 TABLET BY MOUTH EVERY DAY 90 tablet 1  . vitamin C (ASCORBIC ACID) 500 MG tablet Take 500 mg by mouth daily.    . vitamin E 100 UNIT capsule Take 100 Units by mouth daily.     No current facility-administered medications on file prior to visit.     Past Medical History:  Diagnosis Date  . Anemia   . Asthma   . Back pain, chronic   . Diverticulosis   . H/O arthrodesis 11/22/2012  . History of hysterectomy   . Hypertension   . Spinal stenosis   . Synovial cyst     Past Surgical History:  Procedure Laterality Date  . ABDOMINAL HYSTERECTOMY    . BACK SURGERY    . CYST REMOVAL TRUNK      Social History   Socioeconomic History  . Marital status:  Widowed    Spouse name: Not on file  . Number of children: Not on file  . Years of education: Not on file  . Highest education level: Not on file  Occupational History  . Not on file  Social Needs  . Financial resource strain: Not on file  . Food insecurity    Worry: Not on file    Inability: Not on file  . Transportation needs    Medical: Not on file    Non-medical: Not on file  Tobacco Use  . Smoking status: Never Smoker  . Smokeless tobacco: Never Used  Substance and Sexual Activity  . Alcohol use: Yes    Comment: socially  . Drug use: No  . Sexual activity: Not on file  Lifestyle  . Physical activity    Days per week:  Not on file    Minutes per session: Not on file  . Stress: Not on file  Relationships  . Social Herbalist on phone: Not on file    Gets together: Not on file    Attends religious service: Not on file    Active member of club or organization: Not on file    Attends meetings of clubs or organizations: Not on file    Relationship status: Not on file  Other Topics Concern  . Not on file  Social History Narrative   Not currently exercising    Family History  Problem Relation Age of Onset  . Other Father        carotid artery disease  . Heart disease Brother   . Alcohol abuse Brother   . Breast cancer Neg Hx     Review of Systems  Constitutional: Negative for fever.  Respiratory: Negative for shortness of breath.   Cardiovascular: Negative for chest pain, palpitations and leg swelling.  Gastrointestinal: Negative for abdominal pain, blood in stool, diarrhea, nausea and vomiting.       Feeling of "not right" in her epigastric region  Genitourinary: Positive for frequency. Negative for dysuria.  Neurological: Negative for light-headedness and headaches.       Objective:   Vitals:   10/18/18 1302  BP: (!) 152/64  Pulse: 95  Resp: 16  Temp: 99.4 F (37.4 C)  SpO2: 97%   BP Readings from Last 3 Encounters:  10/18/18 (!)  152/64  10/06/18 (!) 148/93  08/24/18 126/70   Wt Readings from Last 3 Encounters:  10/18/18 97 lb 12.8 oz (44.4 kg)  10/06/18 97 lb 10.6 oz (44.3 kg)  08/24/18 101 lb 12.8 oz (46.2 kg)   Body mass index is 21.16 kg/m.   Physical Exam    Constitutional: Appears well-developed and well-nourished. No distress.  HENT:  Head: Normocephalic and atraumatic.  Neck: Neck supple. No tracheal deviation present. No thyromegaly present.  No cervical lymphadenopathy Cardiovascular: Normal rate, regular rhythm and normal heart sounds.  No murmur heard. No carotid bruit .  No edema Pulmonary/Chest: Effort normal and breath sounds normal. No respiratory distress. No has no wheezes. No rales.  Abdomen: soft, NT, ND Skin: Skin is warm and dry. Not diaphoretic.  Psychiatric: Normal mood and affect. Behavior is normal.    CT Abdomen Pelvis W Contrast CLINICAL DATA:  Nausea/vomiting/diarrhea x4 days, elevated lipase  EXAM: CT ABDOMEN AND PELVIS WITH CONTRAST  TECHNIQUE: Multidetector CT imaging of the abdomen and pelvis was performed using the standard protocol following bolus administration of intravenous contrast.  CONTRAST:  34mL OMNIPAQUE IOHEXOL 300 MG/ML  SOLN  COMPARISON:  12/08/2016  FINDINGS: Lower chest: Lung bases are clear.  Hepatobiliary: Liver is within normal limits.  Gallbladder is underdistended. No intrahepatic or extrahepatic ductal dilatation.  Pancreas: Within normal limits. No pancreatic fluid or inflammatory changes.  Spleen: Within normal limits.  Adrenals/Urinary Tract: Adrenal glands are within normal limits.  Kidneys are within normal limits.  No hydronephrosis.  Bladder is within normal limits.  Stomach/Bowel: Stomach is within normal limits.  No evidence of bowel obstruction.  Normal appendix (series 2/image 46).  Mild sigmoid diverticulosis, without evidence of diverticulitis.  Vascular/Lymphatic: No evidence of abdominal aortic aneurysm.   Atherosclerotic calcifications of the abdominal aorta and branch vessels.  No suspicious abdominopelvic lymphadenopathy.  Reproductive: Status post hysterectomy.  Bilateral ovaries are within normal limits.  Other: No abdominopelvic ascites.  Musculoskeletal: Lumbar dextroscoliosis with  degenerative changes.  IMPRESSION: No pancreatic inflammatory changes to confirm the diagnosis of acute pancreatitis.  No evidence of bowel obstruction.  Normal appendix.  Sigmoid diverticulosis, without evidence of diverticulitis.  Electronically Signed   By: Julian Hy M.D.   On: 10/04/2018 02:27   Lab Results  Component Value Date   WBC 7.1 10/06/2018   HGB 11.1 (L) 10/06/2018   HCT 34.4 (L) 10/06/2018   PLT 111 (L) 10/06/2018   GLUCOSE 101 (H) 10/06/2018   CHOL 219 (H) 08/24/2018   TRIG 65.0 08/24/2018   HDL 94.50 08/24/2018   LDLCALC 111 (H) 08/24/2018   ALT 64 (H) 10/05/2018   AST 68 (H) 10/05/2018   NA 139 10/06/2018   K 3.6 10/06/2018   CL 110 10/06/2018   CREATININE 0.63 10/06/2018   BUN 6 (L) 10/06/2018   CO2 23 10/06/2018   TSH 0.35 08/24/2018   HGBA1C 5.9 08/24/2018     Assessment & Plan:    See Problem List for Assessment and Plan of chronic medical problems.

## 2018-10-18 ENCOUNTER — Ambulatory Visit (INDEPENDENT_AMBULATORY_CARE_PROVIDER_SITE_OTHER): Payer: Medicare HMO | Admitting: Internal Medicine

## 2018-10-18 ENCOUNTER — Encounter: Payer: Self-pay | Admitting: Internal Medicine

## 2018-10-18 ENCOUNTER — Other Ambulatory Visit: Payer: Self-pay

## 2018-10-18 VITALS — BP 152/64 | HR 95 | Temp 99.4°F | Resp 16 | Ht <= 58 in | Wt 97.8 lb

## 2018-10-18 DIAGNOSIS — K529 Noninfective gastroenteritis and colitis, unspecified: Secondary | ICD-10-CM | POA: Diagnosis not present

## 2018-10-18 DIAGNOSIS — R350 Frequency of micturition: Secondary | ICD-10-CM | POA: Insufficient documentation

## 2018-10-18 DIAGNOSIS — I10 Essential (primary) hypertension: Secondary | ICD-10-CM | POA: Diagnosis not present

## 2018-10-18 DIAGNOSIS — K219 Gastro-esophageal reflux disease without esophagitis: Secondary | ICD-10-CM

## 2018-10-18 MED ORDER — NEBIVOLOL HCL 2.5 MG PO TABS: 2.5000 mg | ORAL_TABLET | Freq: Every day | ORAL | 5 refills | Status: DC

## 2018-10-18 MED ORDER — OMEPRAZOLE 20 MG PO CPDR: 20.0000 mg | DELAYED_RELEASE_CAPSULE | Freq: Every day | ORAL | 3 refills | Status: DC

## 2018-10-18 NOTE — Assessment & Plan Note (Signed)
Not controlled - flared up after gastroenteritis Start omeprazole 20 mg daily x 14 days and then as needed Continue pepcid daily Call if no improvement

## 2018-10-18 NOTE — Assessment & Plan Note (Signed)
Improved - most symptoms have resolved, but still with epigastric region not feeling right and increased GERD Diarrhea has resolved, no nausea/vomiting or fever

## 2018-10-18 NOTE — Assessment & Plan Note (Signed)
Elevated here today, but possibly from rushing to get here Not tolerating amlodipine - will d/c Continue telmisartan at current dose Add bystolic 2.5 mg if covered

## 2018-10-18 NOTE — Assessment & Plan Note (Signed)
At night only -- not too increased during day ? Related to amlodipine - it started when this med was started - will d/c Continue Tonya [name] Will try a different medication for BP If urinary frequency does not improve - can refer to urology

## 2018-10-24 ENCOUNTER — Ambulatory Visit: Payer: BC Managed Care – PPO | Admitting: Internal Medicine

## 2018-10-27 ENCOUNTER — Other Ambulatory Visit: Payer: Self-pay | Admitting: Internal Medicine

## 2018-10-27 DIAGNOSIS — D0471 Carcinoma in situ of skin of right lower limb, including hip: Secondary | ICD-10-CM | POA: Diagnosis not present

## 2018-11-12 ENCOUNTER — Other Ambulatory Visit: Payer: Self-pay | Admitting: Internal Medicine

## 2018-11-14 NOTE — Progress Notes (Signed)
Virtual Visit via Video Note  I connected with Tonya [name] on 11/15/18 at  2:00 PM EDT by telephone and verified that I am speaking with the correct person using two identifiers.   I discussed the limitations of evaluation and management by telemedicine and the availability of in person appointments. The patient expressed understanding and agreed to proceed.  The patient is currently at home and I am in the office.    No referring provider.    History of Present Illness: The patient is here for follow up for chronic pain management.  Indication for chronic opioid: chronic lower back and neck pain Medication and dose: norco 10-325 mg 1-2 tabs BID prn  # pills per month: 120 Last UDS date: 01/07/2018 Pain contract signed (Y/N): 01/07/2018 Date narcotic database last reviewed (include red flags): 11/15/2018 if no red flags  Pain assessment:  Pain intensity: high with increased yard work, pain is more and she typically takes 4 pills those days.    Amount of pain relief with medication:   Good relief Use of pain medications:   Takes 3-4 pills daily Side effects:    none Sleep: not great secondary to her dog Mood: some mild depression - will increase Cymbalta to 30 mg  Functional/social activities: continues to be active in home  Last took prescribed pain medication:  This morning Alcohol use:   rare Tobacco use:    no Street Drug use:   no     Social History   Socioeconomic History  . Marital status: Widowed    Spouse name: Not on file  . Number of children: Not on file  . Years of education: Not on file  . Highest education level: Not on file  Occupational History  . Not on file  Social Needs  . Financial resource strain: Not on file  . Food insecurity    Worry: Not on file    Inability: Not on file  . Transportation needs    Medical: Not on file    Non-medical: Not on file  Tobacco Use  . Smoking status: Never Smoker  . Smokeless tobacco: Never Used   Substance and Sexual Activity  . Alcohol use: Yes    Comment: socially  . Drug use: No  . Sexual activity: Not on file  Lifestyle  . Physical activity    Days per week: Not on file    Minutes per session: Not on file  . Stress: Not on file  Relationships  . Social Herbalist on phone: Not on file    Gets together: Not on file    Attends religious service: Not on file    Active member of club or organization: Not on file    Attends meetings of clubs or organizations: Not on file    Relationship status: Not on file  Other Topics Concern  . Not on file  Social History Narrative   Not currently exercising     Observations/Objective:    Assessment and Plan:  See Problem List for Assessment and Plan of chronic medical problems.   Follow Up Instructions:    I discussed the assessment and treatment plan with the patient. The patient was provided an opportunity to ask questions and all were answered. The patient agreed with the plan and demonstrated an understanding of the instructions.   The patient was advised to call back or seek an in-person evaluation if the symptoms worsen or if the condition fails to improve  as anticipated.  Follow up in 3 months-chronic pain management and routine follow-up  Time spent on telephone call-10 minutes   [name], MD

## 2018-11-15 ENCOUNTER — Ambulatory Visit (INDEPENDENT_AMBULATORY_CARE_PROVIDER_SITE_OTHER): Payer: Medicare HMO | Admitting: Internal Medicine

## 2018-11-15 ENCOUNTER — Encounter: Payer: Self-pay | Admitting: Internal Medicine

## 2018-11-15 DIAGNOSIS — G8929 Other chronic pain: Secondary | ICD-10-CM

## 2018-11-15 DIAGNOSIS — M4802 Spinal stenosis, cervical region: Secondary | ICD-10-CM

## 2018-11-15 DIAGNOSIS — M545 Low back pain, unspecified: Secondary | ICD-10-CM

## 2018-11-15 DIAGNOSIS — M5440 Lumbago with sciatica, unspecified side: Secondary | ICD-10-CM | POA: Diagnosis not present

## 2018-11-15 MED ORDER — HYDROCODONE-ACETAMINOPHEN 10-325 MG PO TABS: 2.0000 | ORAL_TABLET | Freq: Two times a day (BID) | ORAL | 0 refills | Status: DC

## 2018-11-15 MED ORDER — DULOXETINE HCL 30 MG PO CPEP: 30.0000 mg | ORAL_CAPSULE | Freq: Every day | ORAL | 1 refills | Status: DC

## 2018-11-15 NOTE — Assessment & Plan Note (Signed)
Chronic lower back pain-pain controlled Continue current dose of hydrocodone-acetaminophen She is taking her medication appropriately with good relief of her pain Follow-up in 3 months

## 2018-11-15 NOTE — Assessment & Plan Note (Signed)
[place] controlled substance database checked.  No concerning behavior - taking medication as prescribed Due for refill - sent to pharmacy

## 2018-11-15 NOTE — Assessment & Plan Note (Signed)
With chronic neck pain Overall pain controlled with current pain regimen Continue hydrocodone at current dose Follow-up in 3 months

## 2018-11-21 ENCOUNTER — Other Ambulatory Visit: Payer: Self-pay | Admitting: Internal Medicine

## 2018-12-22 ENCOUNTER — Other Ambulatory Visit: Payer: Self-pay | Admitting: Internal Medicine

## 2018-12-22 DIAGNOSIS — M5440 Lumbago with sciatica, unspecified side: Secondary | ICD-10-CM

## 2018-12-22 DIAGNOSIS — G8929 Other chronic pain: Secondary | ICD-10-CM

## 2018-12-22 MED ORDER — HYDROCODONE-ACETAMINOPHEN 10-325 MG PO TABS: 2.0000 | ORAL_TABLET | Freq: Two times a day (BID) | ORAL | 0 refills | Status: DC

## 2018-12-22 NOTE — Telephone Encounter (Signed)
Medication Refill - Medication: HYDROcodone-acetaminophen ([place]) 10-325 MG tablet    Preferred Pharmacy (with phone number or street name):  [facility] [address]  [address] [place] [address]  Phone: [phone] Fax: [phone]    Agent: Please be advised that RX refills may take up to 3 business days. We ask that you follow-up with your pharmacy.

## 2018-12-22 NOTE — Telephone Encounter (Signed)
Requested medication (s) are due for refill today: yes  Requested medication (s) are on the active medication list: yes  Last refill:  11/15/2018  Future visit scheduled: yes  Notes to clinic:  Refill cannot be delegated    Requested Prescriptions  Pending Prescriptions Disp Refills   HYDROcodone-acetaminophen (NORCO) 10-325 MG tablet 120 tablet 0    Sig: Take 2 tablets by mouth 2 (two) times daily. For chronic lower back and neck pain     Not Delegated - Analgesics:  Opioid Agonist Combinations Failed - 12/22/2018 12:35 PM      Failed - This refill cannot be delegated      Passed - Urine Drug Screen completed in last 360 days.      Passed - Valid encounter within last 6 months    Recent Outpatient Visits          1 month ago Encounter for chronic pain management   [place], MD   2 months ago Acute gastroenteritis   [place], MD   4 months ago Preventative health care   [place], MD   5 months ago Encounter for chronic pain management   [place], MD   8 months ago Encounter for chronic pain management   [place], MD      Future Appointments            In 1 month Burns, Claudina Lick, MD [place], [place]   In 8 months Burns, Claudina Lick, MD [place], [place]

## 2019-01-16 NOTE — Progress Notes (Signed)
Virtual Visit via telephone Note  I connected with Tonya [name] on 01/17/19 at 10:30 AM EST by telephone and verified that I am speaking with the correct person using two identifiers.   I discussed the limitations of evaluation and management by telemedicine and the availability of in person appointments. The patient expressed understanding and agreed to proceed.  Present for the visit:  Myself, Dr Billey Gosling, Dionicio Stall.  The patient is currently at home and I am in the office.    No referring provider.    History of Present Illness: This is an acute visit for vomiting and diarrhea.  Her symptoms started last Wednesday, 7 days ago. She had nausea, vomiting and diarrhea.  She has not vomited since wednesday, the day it started.  She has continued to experience persistent nausea.  She has not been eating much.  She was having intermittent diarrhea and took 2 Imodium the end of last week.  She has not had any diarrhea since then, which was last Thursday or Friday (4-5 days ago).  She has not had a bowel movement since then.  She has been drinking a lot of Gatorade.  Her risk for Covid is [name].  She had a similar episode of diarrhea in July of this year.  She did become very dehydrated and ended up in the hospital.  Full work-up at that time did not reveal a cause and it was presumed to be viral in nature.  Stool testing did not reveal a pathogen.  CT of the abdomen and pelvis was unremarkable.    Review of Systems  Constitutional: Positive for malaise/fatigue. Negative for chills and fever.  HENT: Negative for congestion, ear pain and sore throat.   Gastrointestinal: Positive for diarrhea, nausea and vomiting. Negative for abdominal pain and blood in stool.  Genitourinary: Negative for dysuria, frequency and hematuria.  Musculoskeletal: Negative for myalgias.  Neurological: Negative for headaches.      Social History   Socioeconomic History  . Marital status: Widowed   Spouse name: Not on file  . Number of children: Not on file  . Years of education: Not on file  . Highest education level: Not on file  Occupational History  . Not on file  Social Needs  . Financial resource strain: Not on file  . Food insecurity    Worry: Not on file    Inability: Not on file  . Transportation needs    Medical: Not on file    Non-medical: Not on file  Tobacco Use  . Smoking status: Never Smoker  . Smokeless tobacco: Never Used  Substance and Sexual Activity  . Alcohol use: Yes    Comment: socially  . Drug use: No  . Sexual activity: Not on file  Lifestyle  . Physical activity    Days per week: Not on file    Minutes per session: Not on file  . Stress: Not on file  Relationships  . Social Herbalist on phone: Not on file    Gets together: Not on file    Attends religious service: Not on file    Active member of club or organization: Not on file    Attends meetings of clubs or organizations: Not on file    Relationship status: Not on file  Other Topics Concern  . Not on file  Social History Narrative   Not currently exercising     Observations/Objective:  CT abdomen and pelvis 10/04/2018: No pancreatic inflammatory  changes.  No bowel obstruction.  Normal appendix.  Sigmoid diverticulosis, without evidence of diverticulitis.  C. difficile: Negative (10/04/2018)  Gastrointestinal panel by PCR, stool 10/04/2018: No pathogens detected  Assessment and Plan:  See Problem List for Assessment and Plan of chronic medical problems.   Follow Up Instructions:    I discussed the assessment and treatment plan with the patient. The patient was provided an opportunity to ask questions and all were answered. The patient agreed with the plan and demonstrated an understanding of the instructions.   The patient was advised to call back or seek an in-person evaluation if the symptoms worsen or if the condition fails to improve as anticipated.  Time spent  on telephone call: 13 minutes  [name], MD

## 2019-01-17 ENCOUNTER — Encounter: Payer: Self-pay | Admitting: Internal Medicine

## 2019-01-17 ENCOUNTER — Ambulatory Visit (INDEPENDENT_AMBULATORY_CARE_PROVIDER_SITE_OTHER): Payer: Medicare HMO | Admitting: Internal Medicine

## 2019-01-17 DIAGNOSIS — K529 Noninfective gastroenteritis and colitis, unspecified: Secondary | ICD-10-CM

## 2019-01-17 MED ORDER — OXYBUTYNIN CHLORIDE ER 10 MG PO TB24: 10.0000 mg | ORAL_TABLET | Freq: Every day | ORAL | 2 refills | Status: DC

## 2019-01-17 MED ORDER — ONDANSETRON 4 MG PO TBDP: 4.0000 mg | ORAL_TABLET | Freq: Three times a day (TID) | ORAL | 0 refills | Status: DC | PRN

## 2019-01-17 NOTE — Assessment & Plan Note (Signed)
Her current symptoms are consistent with acute gastroenteritis. Similar to her episode in July in which the work-up was reassuring We will treat like a viral gastroenteritis, but advised if there is no improvement we may need to have her see GI Zofran as needed for nausea Has not vomited and is not currently having diarrhea since Imodium Continue to push fluids, bland diet as tolerated She will let me know if there is no improvement or if she has any questions

## 2019-01-24 ENCOUNTER — Other Ambulatory Visit: Payer: Self-pay

## 2019-01-24 ENCOUNTER — Ambulatory Visit (INDEPENDENT_AMBULATORY_CARE_PROVIDER_SITE_OTHER): Payer: Medicare HMO | Admitting: Internal Medicine

## 2019-01-24 ENCOUNTER — Encounter: Payer: Self-pay | Admitting: Internal Medicine

## 2019-01-24 VITALS — BP 150/64 | HR 74 | Temp 98.3°F | Resp 14 | Ht <= 58 in | Wt 100.0 lb

## 2019-01-24 DIAGNOSIS — R197 Diarrhea, unspecified: Secondary | ICD-10-CM | POA: Diagnosis not present

## 2019-01-24 DIAGNOSIS — R112 Nausea with vomiting, unspecified: Secondary | ICD-10-CM | POA: Diagnosis not present

## 2019-01-24 MED ORDER — OXYBUTYNIN CHLORIDE ER 10 MG PO TB24: 10.0000 mg | ORAL_TABLET | Freq: Every day | ORAL | 2 refills | Status: DC

## 2019-01-24 MED ORDER — OMEPRAZOLE 20 MG PO CPDR: 20.0000 mg | DELAYED_RELEASE_CAPSULE | Freq: Every day | ORAL | 3 refills | Status: DC

## 2019-01-24 NOTE — Patient Instructions (Signed)
Start the omeprazole 20 mg daily - take 30 minutes prior to a meal.   Continue the famotidine for now.    Oxybutynin was resent to your pharmacy.    A referral was ordered for Dr Collene Mares.

## 2019-01-24 NOTE — Assessment & Plan Note (Signed)
Episode started with nausea, vomiting and diarrhea, but at this point just has persistent nausea Has some GERD/reflux symptoms and the nausea could be related to that Restart omeprazole 20 mg daily, continue famotidine 40 mg daily No obvious cause for other symptoms Will refer to Dr. Collene Mares for further evaluation-had complete work-up for her episode in July, which did not reveal any cause

## 2019-01-24 NOTE — Progress Notes (Signed)
Subjective:    Patient ID: Tonya [name], female    DOB: 1940/04/27, 78 y.o.   MRN: [id number]  HPI The patient is here for an acute visit for GI issues.  She had an episode of what we thought was acute gastroenteritis in July-August.  That did resolve.  In the beginning of this month she started experiencing nausea, vomiting and diarrhea.  She is continued to have intermittent nausea since then.  She was able to control the diarrhea initially with Imodium and has not had any diarrhea since then.  She had abdominal pain once that was associated with constipation, but no other concerning pain.  She denies any blood in the stool.  This morning she felt nauseous.  She does experience some heartburn and reflux at times.  Nausea is intermittent.  The antinausea medication, Zofran, does help.  Nothing seems to make the nausea worse as far as specific foods.  She is careful with what she eats.    Last colonoscopy 09/2016 by Dr Collene Mares:  Diverticulosis and internal hemorrhoids.   Medications and allergies reviewed with patient and updated if appropriate.  Patient Active Problem List   Diagnosis Date Noted   Urinary frequency 10/18/2018   Leukopenia 10/04/2018   Thrombocytopenia (HCC) 10/04/2018   Acute gastroenteritis 10/04/2018   Nausea vomiting and diarrhea    Arthralgia 08/24/2018   Hypertension 08/23/2018   DOE (dyspnea on exertion) 01/07/2018   BRBPR (bright red blood per rectum) 01/07/2018   Acute pain of right shoulder 01/07/2018   Pain management contract signed, 01/07/18 01/07/2018   Osteoarthritis of hand 07/05/2017   Gross hematuria 10/15/2016   Prediabetes 09/21/2016   Encounter for chronic pain management 05/27/2016   Fatigue 01/15/2016   Chronic lower back pain 01/15/2016   Osteopenia, high FRAX 01/14/2016   DDD (degenerative disc disease), cervical S/P Decompressive laminectomy and Fusion 11/15/2012   Asthma 11/15/2012   Hypothyroidism  11/15/2012   Depression 11/15/2012   Constipation 11/15/2012   Overactive bladder 11/15/2012   Anxiety 11/15/2012   Cervical spinal stenosis 10/24/2012   Fracture of cervical vertebra ([facility]) 08/31/2012   Scoliosis 07/05/2012   Degeneration of intervertebral disc of lumbosacral region 07/04/2012   GERD (gastroesophageal reflux disease) 06/02/2007    Current Outpatient Medications on File Prior to Visit  Medication Sig Dispense Refill   amLODipine (NORVASC) 2.5 MG tablet TAKE 1 TABLET BY MOUTH EVERY DAY 90 tablet 0   budesonide-formoterol (SYMBICORT) 80-4.5 MCG/ACT inhaler TAKE 2 PUFFS BY MOUTH TWICE A DAY 30.6 Inhaler 5   Calcium Citrate 250 MG TABS Take 1 tablet by mouth daily.      cycloSPORINE (RESTASIS) 0.05 % ophthalmic emulsion Place 1 drop into both eyes 2 (two) times daily.     dimenhyDRINATE (DRAMAMINE) 50 MG tablet Take 50 mg by mouth every 8 (eight) hours as needed for nausea.     diphenhydrAMINE HCl (BENADRYL ALLERGY PO) Take 1 tablet by mouth daily as needed (sleep).      DULoxetine (CYMBALTA) 30 MG capsule Take 1 capsule (30 mg total) by mouth daily. 90 capsule 1   famotidine (PEPCID) 40 MG tablet Take 1 tablet (40 mg total) by mouth daily. 90 tablet 1   fesoterodine (TOVIAZ) 8 MG TB24 tablet TAKE 1 TABLET (8 MG) DAILY. 30 tablet 2   HYDROcodone-acetaminophen (NORCO) 10-325 MG tablet Take 2 tablets by mouth 2 (two) times daily. For chronic lower back and neck pain 120 tablet 0   levalbuterol (XOPENEX HFA) 45  MCG/ACT inhaler Inhale 1-2 puffs into the lungs every 4 (four) hours as needed for wheezing. 1 Inhaler 8   Magnesium 125 MG CAPS Take 125 mg by mouth every evening.      nebivolol (BYSTOLIC) 2.5 MG tablet Take 1 tablet (2.5 mg total) by mouth daily. 30 tablet 5   omeprazole (PRILOSEC) 20 MG capsule Take 1 capsule (20 mg total) by mouth daily. Take 30 minutes before lunch.  Take daily for two weeks and then as needed. 30 capsule 3   ondansetron  (ZOFRAN ODT) 4 MG disintegrating tablet Take 1 tablet (4 mg total) by mouth every 8 (eight) hours as needed for nausea or vomiting. 20 tablet 0   oxybutynin (DITROPAN-XL) 10 MG 24 hr tablet Take 1 tablet (10 mg total) by mouth at bedtime. 30 tablet 2   SYNTHROID 75 MCG tablet TAKE 1 TABLET BY MOUTH EVERY DAY 90 tablet 0   telmisartan (MICARDIS) 40 MG tablet TAKE 1 TABLET BY MOUTH EVERY DAY 90 tablet 1   vitamin C (ASCORBIC ACID) 500 MG tablet Take 500 mg by mouth daily.     vitamin E 100 UNIT capsule Take 100 Units by mouth daily.     No current facility-administered medications on file prior to visit.     Past Medical History:  Diagnosis Date   Anemia    Asthma    Back pain, chronic    Diverticulosis    H/O arthrodesis 11/22/2012   History of hysterectomy    Hypertension    Spinal stenosis    Synovial cyst     Past Surgical History:  Procedure Laterality Date   ABDOMINAL HYSTERECTOMY     BACK SURGERY     CYST REMOVAL TRUNK      Social History   Socioeconomic History   Marital status: Widowed    Spouse name: Not on file   Number of children: Not on file   Years of education: Not on file   Highest education level: Not on file  Occupational History   Not on file  Social Needs   Financial resource strain: Not on file   Food insecurity    Worry: Not on file    Inability: Not on file   Transportation needs    Medical: Not on file    Non-medical: Not on file  Tobacco Use   Smoking status: Never Smoker   Smokeless tobacco: Never Used  Substance and Sexual Activity   Alcohol use: Yes    Comment: socially   Drug use: No   Sexual activity: Not on file  Lifestyle   Physical activity    Days per week: Not on file    Minutes per session: Not on file   Stress: Not on file  Relationships   Social connections    Talks on phone: Not on file    Gets together: Not on file    Attends religious service: Not on file    Active member of club  or organization: Not on file    Attends meetings of clubs or organizations: Not on file    Relationship status: Not on file  Other Topics Concern   Not on file  Social History Narrative   Not currently exercising    Family History  Problem Relation Age of Onset   Other Father        carotid artery disease   Heart disease Brother    Alcohol abuse Brother    Breast cancer Neg Hx  Review of Systems  Constitutional: Negative for chills and fever.  HENT: Negative for congestion, sinus pain and sore throat.   Respiratory: Positive for shortness of breath (chronic). Negative for cough and wheezing.   Cardiovascular: Negative for chest pain and palpitations.  Gastrointestinal: Positive for nausea. Negative for abdominal pain and blood in stool.       GERD  Genitourinary: Negative for dysuria and hematuria.  Neurological: Positive for light-headedness (occasional). Negative for headaches.       Objective:   Vitals:   01/24/19 1122  BP: (!) 150/64  Pulse: 74  Resp: 14  Temp: 98.3 F (36.8 C)  SpO2: 99%   BP Readings from Last 3 Encounters:  01/24/19 (!) 150/64  10/18/18 (!) 152/64  10/06/18 (!) 148/93   Wt Readings from Last 3 Encounters:  01/24/19 100 lb (45.4 kg)  10/18/18 97 lb 12.8 oz (44.4 kg)  10/06/18 97 lb 10.6 oz (44.3 kg)   Body mass index is 21.64 kg/m.   Physical Exam Constitutional:      General: She is not in acute distress.    Appearance: Normal appearance. She is not ill-appearing.  HENT:     Head: Normocephalic and atraumatic.  Abdominal:     General: Abdomen is flat. There is no distension.     Palpations: Abdomen is soft. There is no mass.     Tenderness: There is no abdominal tenderness. There is no guarding or rebound.  Skin:    General: Skin is warm and dry.  Neurological:     Mental Status: She is alert.            Assessment & Plan:    See Problem List for Assessment and Plan of chronic medical problems.

## 2019-01-31 ENCOUNTER — Other Ambulatory Visit (HOSPITAL_COMMUNITY): Payer: Self-pay | Admitting: Gastroenterology

## 2019-01-31 ENCOUNTER — Other Ambulatory Visit: Payer: Self-pay | Admitting: Gastroenterology

## 2019-01-31 DIAGNOSIS — K219 Gastro-esophageal reflux disease without esophagitis: Secondary | ICD-10-CM | POA: Diagnosis not present

## 2019-01-31 DIAGNOSIS — K5730 Diverticulosis of large intestine without perforation or abscess without bleeding: Secondary | ICD-10-CM | POA: Diagnosis not present

## 2019-01-31 DIAGNOSIS — R110 Nausea: Secondary | ICD-10-CM

## 2019-01-31 DIAGNOSIS — D509 Iron deficiency anemia, unspecified: Secondary | ICD-10-CM | POA: Diagnosis not present

## 2019-01-31 DIAGNOSIS — R194 Change in bowel habit: Secondary | ICD-10-CM | POA: Diagnosis not present

## 2019-02-03 ENCOUNTER — Other Ambulatory Visit: Payer: Self-pay | Admitting: Internal Medicine

## 2019-02-03 DIAGNOSIS — M5440 Lumbago with sciatica, unspecified side: Secondary | ICD-10-CM

## 2019-02-03 DIAGNOSIS — G8929 Other chronic pain: Secondary | ICD-10-CM

## 2019-02-03 NOTE — Telephone Encounter (Signed)
Medication Refill - Medication: budesonide-formoterol (SYMBICORT) 80-4.5 MCG/ACT inhaler  HYDROcodone-acetaminophen (NORCO) 10-325 MG tablet    Has the patient contacted their pharmacy? No. (Agent: If no, request that the patient contact the pharmacy for the refill.) (Agent: If yes, when and what did the pharmacy advise?)  Preferred Pharmacy (with phone number or street name):  [facility] [place], [place] [phone] (Phone) [phone] (Fax)     Agent: Please be advised that RX refills may take up to 3 business days. We ask that you follow-up with your pharmacy.

## 2019-02-06 MED ORDER — HYDROCODONE-ACETAMINOPHEN 10-325 MG PO TABS: 2.0000 | ORAL_TABLET | Freq: Two times a day (BID) | ORAL | 0 refills | Status: DC

## 2019-02-06 MED ORDER — BUDESONIDE-FORMOTEROL FUMARATE 80-4.5 MCG/ACT IN AERO: INHALATION_SPRAY | RESPIRATORY_TRACT | 5 refills | Status: DC

## 2019-02-06 NOTE — Telephone Encounter (Signed)
Last RF 12/22/18 Last OV 11/15/18 Next OV 02/08/19

## 2019-02-07 NOTE — Patient Instructions (Addendum)
  Tests ordered today. Your results will be released to [place] (or called to you) after review.  If any changes need to be made, you will be notified at that same time.   Monitor your BP at home and send me your readings.   Medications reviewed and updated.  Changes include :   None.  Start a daily fiber supplement.  Your prescription(s) have been submitted to your pharmacy. Please take as directed and contact our office if you believe you are having problem(s) with the medication(s).   Please followup in 3 months

## 2019-02-07 NOTE — Progress Notes (Signed)
Subjective:    Patient ID: Tonya [name], female    DOB: Jun 10, 1940, 78 y.o.   MRN: [id number]  HPI The patient is here for follow up.  The patient is here for follow up for chronic pain management.  Indication for chronic opioid: chronic lower back pain Medication and dose: norco 10-325 mg 1-2 tabs BID prn # pills per month: 120 Last UDS date: due  Pain contract signed (Y/N): Y 01/07/2018 - due  Date narcotic database last reviewed: 02/08/2019, no red flags  Pain assessment:  Pain intensity:high after yard work, tolerable other times  Amount of pain relief with medication: good Use of pain medications: 3-4 pill daily - 4 pills after more intense activity Side effects: none Sleep: not great - not related to back Mood: denies depression, anxiety Functional/social activities: continues to be active in home setting   Last took prescribed pain medication:  today Alcohol use:   rare Tobacco use:   no Street Drug use:   no   Depression: She is taking her medication daily as prescribed. She denies any side effects from the medication. She feels her depression is well controlled and she is happy with her current dose of medication.   GERD:  She is taking her medication daily as prescribed.  She denies any GERD symptoms and feels her GERD is well controlled.   Prediabetes:  She is compliant with a low sugar/carbohydrate diet.  She is active..  Hypothyroidism:  She is taking her medication daily.  She denies any recent changes in energy or weight that are unexplained.   Hypertension: She is taking her medication daily, , but did not take her meds this morning or yesterday. She is not compliant with a low sodium diet.  She denies chest pain, palpitations, edema, shortness of breath and regular headaches. She does not monitor her blood pressure at home, but she start.     Medications and allergies reviewed with patient and updated if appropriate.  Patient Active Problem List    Diagnosis Date Noted  . Urinary frequency 10/18/2018  . Leukopenia 10/04/2018  . Thrombocytopenia ([place]) 10/04/2018  . Acute gastroenteritis 10/04/2018  . Nausea vomiting and diarrhea   . Arthralgia 08/24/2018  . Hypertension 08/23/2018  . DOE (dyspnea on exertion) 01/07/2018  . BRBPR (bright red blood per rectum) 01/07/2018  . Acute pain of right shoulder 01/07/2018  . Pain management contract signed, 01/07/18 01/07/2018  . Osteoarthritis of hand 07/05/2017  . Gross hematuria 10/15/2016  . Prediabetes 09/21/2016  . Encounter for chronic pain management 05/27/2016  . Fatigue 01/15/2016  . Chronic lower back pain 01/15/2016  . Osteopenia, high FRAX 01/14/2016  . DDD (degenerative disc disease), cervical S/P Decompressive laminectomy and Fusion 11/15/2012  . Asthma 11/15/2012  . Hypothyroidism 11/15/2012  . Depression 11/15/2012  . Constipation 11/15/2012  . Overactive bladder 11/15/2012  . Anxiety 11/15/2012  . Cervical spinal stenosis 10/24/2012  . Fracture of cervical vertebra ([place]) 08/31/2012  . Scoliosis 07/05/2012  . Degeneration of intervertebral disc of lumbosacral region 07/04/2012  . GERD (gastroesophageal reflux disease) 06/02/2007    Current Outpatient Medications on File Prior to Visit  Medication Sig Dispense Refill  . amLODipine (NORVASC) 2.5 MG tablet TAKE 1 TABLET BY MOUTH EVERY DAY 90 tablet 0  . budesonide-formoterol (SYMBICORT) 80-4.5 MCG/ACT inhaler TAKE 2 PUFFS BY MOUTH TWICE A DAY 30.6 Inhaler 5  . Calcium Citrate 250 MG TABS Take 1 tablet by mouth daily.     Marland Kitchen  cycloSPORINE (RESTASIS) 0.05 % ophthalmic emulsion Place 1 drop into both eyes 2 (two) times daily.    Marland Kitchen dimenhyDRINATE (DRAMAMINE) 50 MG tablet Take 50 mg by mouth every 8 (eight) hours as needed for nausea.    . diphenhydrAMINE HCl (BENADRYL ALLERGY PO) Take 1 tablet by mouth daily as needed (sleep).     . DULoxetine (CYMBALTA) 30 MG capsule Take 1 capsule (30 mg total) by mouth daily. 90 capsule  1  . famotidine (PEPCID) 40 MG tablet Take 1 tablet (40 mg total) by mouth daily. 90 tablet 1  . fesoterodine (TOVIAZ) 8 MG TB24 tablet TAKE 1 TABLET (8 MG) DAILY. 30 tablet 2  . HYDROcodone-acetaminophen (NORCO) 10-325 MG tablet Take 2 tablets by mouth 2 (two) times daily. For chronic lower back and neck pain 120 tablet 0  . levalbuterol (XOPENEX HFA) 45 MCG/ACT inhaler Inhale 1-2 puffs into the lungs every 4 (four) hours as needed for wheezing. 1 Inhaler 8  . Magnesium 125 MG CAPS Take 125 mg by mouth every evening.     . nebivolol (BYSTOLIC) 2.5 MG tablet Take 1 tablet (2.5 mg total) by mouth daily. 30 tablet 5  . ondansetron (ZOFRAN ODT) 4 MG disintegrating tablet Take 1 tablet (4 mg total) by mouth every 8 (eight) hours as needed for nausea or vomiting. 20 tablet 0  . oxybutynin (DITROPAN-XL) 10 MG 24 hr tablet Take 1 tablet (10 mg total) by mouth at bedtime. 30 tablet 2  . SYNTHROID 75 MCG tablet TAKE 1 TABLET BY MOUTH EVERY DAY 90 tablet 0  . telmisartan (MICARDIS) 40 MG tablet TAKE 1 TABLET BY MOUTH EVERY DAY 90 tablet 1  . vitamin C (ASCORBIC ACID) 500 MG tablet Take 500 mg by mouth daily.    . vitamin E 100 UNIT capsule Take 100 Units by mouth daily.     No current facility-administered medications on file prior to visit.     Past Medical History:  Diagnosis Date  . Anemia   . Asthma   . Back pain, chronic   . Diverticulosis   . H/O arthrodesis 11/22/2012  . History of hysterectomy   . Hypertension   . Spinal stenosis   . Synovial cyst     Past Surgical History:  Procedure Laterality Date  . ABDOMINAL HYSTERECTOMY    . BACK SURGERY    . CYST REMOVAL TRUNK      Social History   Socioeconomic History  . Marital status: Widowed    Spouse name: Not on file  . Number of children: Not on file  . Years of education: Not on file  . Highest education level: Not on file  Occupational History  . Not on file  Social Needs  . Financial resource strain: Not on file  . Food  insecurity    Worry: Not on file    Inability: Not on file  . Transportation needs    Medical: Not on file    Non-medical: Not on file  Tobacco Use  . Smoking status: Never Smoker  . Smokeless tobacco: Never Used  Substance and Sexual Activity  . Alcohol use: Yes    Comment: socially  . Drug use: No  . Sexual activity: Not on file  Lifestyle  . Physical activity    Days per week: Not on file    Minutes per session: Not on file  . Stress: Not on file  Relationships  . Social connections    Talks on phone: Not on file  Gets together: Not on file    Attends religious service: Not on file    Active member of club or organization: Not on file    Attends meetings of clubs or organizations: Not on file    Relationship status: Not on file  Other Topics Concern  . Not on file  Social History Narrative   Not currently exercising    Family History  Problem Relation Age of Onset  . Other Father        carotid artery disease  . Heart disease Brother   . Alcohol abuse Brother   . Breast cancer Neg Hx     Review of Systems  Constitutional: Negative for chills and fever.  Respiratory: Negative for cough, shortness of breath and wheezing.   Cardiovascular: Negative for chest pain, palpitations and leg swelling.  Gastrointestinal: Positive for constipation and diarrhea. Negative for abdominal pain and blood in stool.       No gerd  Neurological: Negative for light-headedness and headaches.       Objective:   Vitals:   02/08/19 1405  BP: (!) 162/84  Pulse: 75  Resp: 16  Temp: 97.8 F (36.6 C)  SpO2: 99%   BP Readings from Last 3 Encounters:  02/08/19 (!) 162/84  01/24/19 (!) 150/64  10/18/18 (!) 152/64   Wt Readings from Last 3 Encounters:  02/08/19 100 lb (45.4 kg)  01/24/19 100 lb (45.4 kg)  10/18/18 97 lb 12.8 oz (44.4 kg)   Body mass index is 21.64 kg/m.   Physical Exam    Constitutional: Appears well-developed and well-nourished. No distress.  HENT:   Head: Normocephalic and atraumatic.  Neck: Neck supple. No tracheal deviation present. No thyromegaly present.  No cervical lymphadenopathy Cardiovascular: Normal rate, regular rhythm and normal heart sounds.   No murmur heard. No carotid bruit .  No edema Pulmonary/Chest: Effort normal and breath sounds normal. No respiratory distress. No has no wheezes. No rales.  Skin: Skin is warm and dry. Not diaphoretic.  Psychiatric: Normal mood and affect. Behavior is normal.      Assessment & Plan:    See Problem List for Assessment and Plan of chronic medical problems.    This visit occurred during the SARS-CoV-2 public health emergency.  Safety protocols were in place, including screening questions prior to the visit, additional usage of staff PPE, and extensive cleaning of exam room while observing appropriate contact time as indicated for disinfecting solutions.

## 2019-02-08 ENCOUNTER — Ambulatory Visit (INDEPENDENT_AMBULATORY_CARE_PROVIDER_SITE_OTHER): Payer: Medicare HMO | Admitting: Internal Medicine

## 2019-02-08 ENCOUNTER — Other Ambulatory Visit (INDEPENDENT_AMBULATORY_CARE_PROVIDER_SITE_OTHER): Payer: Medicare HMO

## 2019-02-08 ENCOUNTER — Encounter: Payer: Self-pay | Admitting: Internal Medicine

## 2019-02-08 ENCOUNTER — Other Ambulatory Visit: Payer: Self-pay

## 2019-02-08 ENCOUNTER — Other Ambulatory Visit: Payer: Self-pay | Admitting: Internal Medicine

## 2019-02-08 VITALS — BP 162/84 | HR 75 | Temp 97.8°F | Resp 16 | Ht <= 58 in | Wt 100.0 lb

## 2019-02-08 DIAGNOSIS — R7303 Prediabetes: Secondary | ICD-10-CM

## 2019-02-08 DIAGNOSIS — K5900 Constipation, unspecified: Secondary | ICD-10-CM | POA: Diagnosis not present

## 2019-02-08 DIAGNOSIS — I10 Essential (primary) hypertension: Secondary | ICD-10-CM

## 2019-02-08 DIAGNOSIS — M545 Low back pain, unspecified: Secondary | ICD-10-CM

## 2019-02-08 DIAGNOSIS — D649 Anemia, unspecified: Secondary | ICD-10-CM

## 2019-02-08 DIAGNOSIS — G8929 Other chronic pain: Secondary | ICD-10-CM

## 2019-02-08 DIAGNOSIS — F3289 Other specified depressive episodes: Secondary | ICD-10-CM | POA: Diagnosis not present

## 2019-02-08 DIAGNOSIS — K219 Gastro-esophageal reflux disease without esophagitis: Secondary | ICD-10-CM | POA: Diagnosis not present

## 2019-02-08 DIAGNOSIS — R69 Illness, unspecified: Secondary | ICD-10-CM | POA: Diagnosis not present

## 2019-02-08 DIAGNOSIS — E039 Hypothyroidism, unspecified: Secondary | ICD-10-CM

## 2019-02-08 LAB — CBC WITH DIFFERENTIAL/PLATELET
Basophils Absolute: 0.1 10*3/uL (ref 0.0–0.1)
Basophils Relative: 0.6 % (ref 0.0–3.0)
Eosinophils Absolute: 0.1 10*3/uL (ref 0.0–0.7)
Eosinophils Relative: 0.9 % (ref 0.0–5.0)
HCT: 37.7 % (ref 36.0–46.0)
Hemoglobin: 12.3 g/dL (ref 12.0–15.0)
Lymphocytes Relative: 24.9 % (ref 12.0–46.0)
Lymphs Abs: 2.2 10*3/uL (ref 0.7–4.0)
MCHC: 32.7 g/dL (ref 30.0–36.0)
MCV: 92.1 fl (ref 78.0–100.0)
Monocytes Absolute: 0.7 10*3/uL (ref 0.1–1.0)
Monocytes Relative: 7.5 % (ref 3.0–12.0)
Neutro Abs: 6 10*3/uL (ref 1.4–7.7)
Neutrophils Relative %: 66.1 % (ref 43.0–77.0)
Platelets: 234 10*3/uL (ref 150.0–400.0)
RBC: 4.09 Mil/uL (ref 3.87–5.11)
RDW: 14.1 % (ref 11.5–15.5)
WBC: 9 10*3/uL (ref 4.0–10.5)

## 2019-02-08 LAB — COMPREHENSIVE METABOLIC PANEL
ALT: 13 U/L (ref 0–35)
AST: 16 U/L (ref 0–37)
Albumin: 4.3 g/dL (ref 3.5–5.2)
Alkaline Phosphatase: 85 U/L (ref 39–117)
BUN: 16 mg/dL (ref 6–23)
CO2: 26 mEq/L (ref 19–32)
Calcium: 9.7 mg/dL (ref 8.4–10.5)
Chloride: 102 mEq/L (ref 96–112)
Creatinine, Ser: 0.83 mg/dL (ref 0.40–1.20)
GFR: 66.64 mL/min (ref >60.00)
Glucose, Bld: 99 mg/dL (ref 70–99)
Potassium: 3.8 mEq/L (ref 3.5–5.1)
Sodium: 138 mEq/L (ref 135–145)
Total Bilirubin: 0.6 mg/dL (ref 0.2–1.2)
Total Protein: 7.2 g/dL (ref 6.0–8.3)

## 2019-02-08 LAB — HEMOGLOBIN A1C: Hgb A1c MFr Bld: 5.9 % (ref 4.6–6.5)

## 2019-02-08 LAB — TSH: TSH: 0.11 u[IU]/mL — ABNORMAL LOW (ref 0.35–4.50)

## 2019-02-08 MED ORDER — OMEPRAZOLE 20 MG PO CPDR: 20.0000 mg | DELAYED_RELEASE_CAPSULE | Freq: Every day | ORAL | 5 refills | Status: DC

## 2019-02-08 MED ORDER — SYNTHROID 50 MCG PO TABS: 50.0000 ug | ORAL_TABLET | Freq: Every day | ORAL | 1 refills | Status: DC

## 2019-02-08 NOTE — Assessment & Plan Note (Signed)
GERD controlled Continue daily medication  

## 2019-02-08 NOTE — Assessment & Plan Note (Signed)
Has been experiencing constipation alternating with diarrhea Following with Dr. Collene Mares Being worked up for gallbladder disease Advised trying Metamucil on a daily basis

## 2019-02-08 NOTE — Assessment & Plan Note (Signed)
Controlled, stable Continue current dose of medication  

## 2019-02-08 NOTE — Assessment & Plan Note (Signed)
Chronic lower back pain controlled Taking medication appropriately, medication well-tolerated and effective Continue current dosing of medication regimen [place] controlled substance database checked.  No refill needed today Follow-up in 3 months

## 2019-02-08 NOTE — Assessment & Plan Note (Signed)
Blood pressure-today-?  Controlled Not monitoring blood pressure at home Stressed the importance of good blood pressure control She will start monitoring this for 1 week straight and then my chart me the readings Will increase Bystolic necessary CMP

## 2019-02-08 NOTE — Assessment & Plan Note (Signed)
Check a1c Low sugar / carb diet Stressed regular exercise   

## 2019-02-08 NOTE — Assessment & Plan Note (Signed)
[place] controlled substance database checked.  No concerning behavior Refill not needed today Medication is effective-continue current dose Follow-up in 3 months

## 2019-02-08 NOTE — Assessment & Plan Note (Signed)
Clinically euthyroid Check tsh  Titrate med dose if needed  

## 2019-02-09 LAB — IRON,TIBC AND FERRITIN PANEL
%SAT: 27 % (calc) (ref 16–45)
Ferritin: 37 ng/mL (ref 16–288)
Iron: 81 ug/dL (ref 45–160)
TIBC: 303 mcg/dL (calc) (ref 250–450)

## 2019-02-20 ENCOUNTER — Ambulatory Visit (HOSPITAL_COMMUNITY)
Admission: RE | Admit: 2019-02-20 | Discharge: 2019-02-20 | Disposition: A | Discharge status: Home / Self Care | Payer: Medicare HMO | Source: Ambulatory Visit | Attending: Gastroenterology | Admitting: Gastroenterology

## 2019-02-20 ENCOUNTER — Encounter (HOSPITAL_COMMUNITY)
Admission: RE | Admit: 2019-02-20 | Discharge: 2019-02-20 | Disposition: A | Discharge status: Home / Self Care | Payer: Medicare HMO | Source: Ambulatory Visit | Attending: Gastroenterology | Admitting: Gastroenterology

## 2019-02-20 ENCOUNTER — Other Ambulatory Visit: Payer: Self-pay

## 2019-02-20 DIAGNOSIS — R110 Nausea: Secondary | ICD-10-CM | POA: Diagnosis not present

## 2019-02-20 DIAGNOSIS — R1013 Epigastric pain: Secondary | ICD-10-CM | POA: Diagnosis not present

## 2019-02-20 MED ORDER — TECHNETIUM TC 99M MEBROFENIN IV KIT
5.5000 | PACK | Freq: Once | INTRAVENOUS | Status: AC | PRN
  Administered 2019-02-20: 09:00:00 5.5 via INTRAVENOUS

## 2019-02-22 ENCOUNTER — Other Ambulatory Visit: Payer: Self-pay

## 2019-02-22 ENCOUNTER — Encounter (HOSPITAL_COMMUNITY)
Admission: RE | Admit: 2019-02-22 | Discharge: 2019-02-22 | Disposition: A | Discharge status: Home / Self Care | Payer: Medicare HMO | Source: Ambulatory Visit | Attending: Gastroenterology | Admitting: Gastroenterology

## 2019-02-22 DIAGNOSIS — R110 Nausea: Secondary | ICD-10-CM | POA: Diagnosis present

## 2019-02-22 DIAGNOSIS — R112 Nausea with vomiting, unspecified: Secondary | ICD-10-CM | POA: Diagnosis not present

## 2019-02-22 MED ORDER — TECHNETIUM TC 99M SULFUR COLLOID
2.2000 | Freq: Once | INTRAVENOUS | Status: AC | PRN
  Administered 2019-02-22: 08:00:00 2.2 via INTRAVENOUS

## 2019-02-23 ENCOUNTER — Other Ambulatory Visit: Payer: Self-pay | Admitting: Internal Medicine

## 2019-03-13 ENCOUNTER — Other Ambulatory Visit: Payer: Self-pay

## 2019-03-13 ENCOUNTER — Ambulatory Visit (INDEPENDENT_AMBULATORY_CARE_PROVIDER_SITE_OTHER): Payer: Medicare Other | Admitting: Family

## 2019-03-13 ENCOUNTER — Encounter: Payer: Self-pay | Admitting: Family

## 2019-03-13 VITALS — BP 140/80 | HR 80 | Temp 98.0°F | Ht <= 58 in | Wt 107.6 lb

## 2019-03-13 DIAGNOSIS — M25562 Pain in left knee: Secondary | ICD-10-CM | POA: Diagnosis not present

## 2019-03-13 MED ORDER — CEFTRIAXONE SODIUM 500 MG IJ SOLR
500.0000 mg | Freq: Once | INTRAMUSCULAR | Status: AC
  Administered 2019-03-13: 17:00:00 500 mg via INTRAMUSCULAR

## 2019-03-13 NOTE — Progress Notes (Signed)
Tonya [name] is a 79 y.o. female with the following history as recorded in Epic[employer]:  Patient Active Problem List   Diagnosis Date Noted  . Urinary frequency 10/18/2018  . Leukopenia 10/04/2018  . Thrombocytopenia ([place]) 10/04/2018  . Acute gastroenteritis 10/04/2018  . Nausea vomiting and diarrhea   . Arthralgia 08/24/2018  . Hypertension 08/23/2018  . DOE (dyspnea on exertion) 01/07/2018  . BRBPR (bright red blood per rectum) 01/07/2018  . Acute pain of right shoulder 01/07/2018  . Pain management contract signed, 01/07/18 01/07/2018  . Osteoarthritis of hand 07/05/2017  . Gross hematuria 10/15/2016  . Prediabetes 09/21/2016  . Encounter for chronic pain management 05/27/2016  . Fatigue 01/15/2016  . Chronic lower back pain 01/15/2016  . Osteopenia, high FRAX 01/14/2016  . DDD (degenerative disc disease), cervical S/P Decompressive laminectomy and Fusion 11/15/2012  . Asthma 11/15/2012  . Hypothyroidism 11/15/2012  . Depression 11/15/2012  . Constipation 11/15/2012  . Overactive bladder 11/15/2012  . Anxiety 11/15/2012  . Cervical spinal stenosis 10/24/2012  . Fracture of cervical vertebra ([place]) 08/31/2012  . Scoliosis 07/05/2012  . Degeneration of intervertebral disc of lumbosacral region 07/04/2012  . GERD (gastroesophageal reflux disease) 06/02/2007    Current Outpatient Medications  Medication Sig Dispense Refill  . amLODipine (NORVASC) 2.5 MG tablet TAKE 1 TABLET BY MOUTH EVERY DAY 90 tablet 0  . budesonide-formoterol (SYMBICORT) 80-4.5 MCG/ACT inhaler TAKE 2 PUFFS BY MOUTH TWICE A DAY 30.6 Inhaler 5  . Calcium Citrate 250 MG TABS Take 1 tablet by mouth daily.     . cycloSPORINE (RESTASIS) 0.05 % ophthalmic emulsion Place 1 drop into both eyes 2 (two) times daily.    Marland Kitchen dimenhyDRINATE (DRAMAMINE) 50 MG tablet Take 50 mg by mouth every 8 (eight) hours as needed for nausea.    . diphenhydrAMINE HCl (BENADRYL ALLERGY PO) Take 1 tablet by mouth daily as needed  (sleep).     . DULoxetine (CYMBALTA) 30 MG capsule Take 1 capsule (30 mg total) by mouth daily. 90 capsule 1  . famotidine (PEPCID) 40 MG tablet Take 1 tablet (40 mg total) by mouth daily. 90 tablet 1  . fesoterodine (TOVIAZ) 8 MG TB24 tablet TAKE 1 TABLET (8 MG) DAILY. 30 tablet 2  . HYDROcodone-acetaminophen (NORCO) 10-325 MG tablet Take 2 tablets by mouth 2 (two) times daily. For chronic lower back and neck pain 120 tablet 0  . levalbuterol (XOPENEX HFA) 45 MCG/ACT inhaler Inhale 1-2 puffs into the lungs every 4 (four) hours as needed for wheezing. 1 Inhaler 8  . Magnesium 125 MG CAPS Take 125 mg by mouth every evening.     . nebivolol (BYSTOLIC) 2.5 MG tablet Take 1 tablet (2.5 mg total) by mouth daily. 30 tablet 5  . omeprazole (PRILOSEC) 20 MG capsule Take 1 capsule (20 mg total) by mouth daily. Take 30 minutes before lunch. 30 capsule 5  . ondansetron (ZOFRAN ODT) 4 MG disintegrating tablet Take 1 tablet (4 mg total) by mouth every 8 (eight) hours as needed for nausea or vomiting. 20 tablet 0  . oxybutynin (DITROPAN-XL) 10 MG 24 hr tablet Take 1 tablet (10 mg total) by mouth at bedtime. 30 tablet 2  . sulfamethoxazole-trimethoprim (BACTRIM DS) 800-160 MG tablet Take 1 tablet by mouth 2 (two) times daily.    Marland Kitchen SYNTHROID 50 MCG tablet Take 1 tablet (50 mcg total) by mouth daily before breakfast. 90 tablet 1  . telmisartan (MICARDIS) 40 MG tablet TAKE 1 TABLET BY MOUTH EVERY DAY 90  tablet 1  . vitamin C (ASCORBIC ACID) 500 MG tablet Take 500 mg by mouth daily.    . vitamin E 100 UNIT capsule Take 100 Units by mouth daily.     No current facility-administered medications for this visit.    Allergies: Prochlorperazine edisylate and Other  Past Medical History:  Diagnosis Date  . Anemia   . Asthma   . Back pain, chronic   . Diverticulosis   . H/O arthrodesis 11/22/2012  . History of hysterectomy   . Hypertension   . Spinal stenosis   . Synovial cyst     Past Surgical History:   Procedure Laterality Date  . ABDOMINAL HYSTERECTOMY    . BACK SURGERY    . CYST REMOVAL TRUNK      Family History  Problem Relation Age of Onset  . Other Father        carotid artery disease  . Heart disease Brother   . Alcohol abuse Brother   . Breast cancer Neg Hx     Social History   Tobacco Use  . Smoking status: Never Smoker  . Smokeless tobacco: Never Used  Substance Use Topics  . Alcohol use: Yes    Comment: socially    Subjective:  Patient fell last week and injured her left knee- fell onto a concrete step; was seen at urgent care yesterday and started on Bactrim DS; is concerned about worsening swelling/ redness over the knee cap; was unable to get imaging done at the [facility] yesterday as their x-ray machine was not working.   Objective:  Vitals:   03/13/19 1531  BP: 140/80  Pulse: 80  Temp: 98 F (36.7 C)  TempSrc: Oral  SpO2: 96%  Weight: 107 lb 9.6 oz (48.8 kg)  Height: 4\' 9"  (1.448 m)    General: Well developed, well nourished, in no acute distress  Skin : Warm and dry.  Head: Normocephalic and atraumatic  Lungs: Respirations unlabored;  Musculoskeletal: No deformities; marked erythema/ swelling noted over the left knee cap; pustular laceration noted;  Extremities: No edema, cyanosis, clubbing  Vessels: Symmetric bilaterally  Neurologic: Alert and oriented; speech intact; face symmetrical; moves all extremities well; CNII-XII intact without focal deficit   Assessment:  1. Acute pain of left knee     Plan:  Rocephin IM 500 mg given in office today; would like to visualize the knee cap and soft tissue surrounding but our x-ray machine is not working today either and unsure it will be working tomorrow; Sports medicine agrees to see patient tomorrow for 1 day follow-up to ultrasound the knee and possible X-ray if machine is working; patient may benefit from drainage from joint as well; Follow-up with sports medicine tomorrow.   No follow-ups on file.  No  orders of the defined types were placed in this encounter.   Requested Prescriptions    No prescriptions requested or ordered in this encounter

## 2019-03-13 NOTE — Patient Instructions (Signed)
Please stop by sports medicine downstairs on your way out and they will schedule you a follow-up for tomorrow. I wish we could do an X-ray for you today but our machine is broken. They can ultrasound the knee tomorrow and give you another injection of antibiotics as well.

## 2019-03-14 ENCOUNTER — Ambulatory Visit (INDEPENDENT_AMBULATORY_CARE_PROVIDER_SITE_OTHER): Payer: BC Managed Care – PPO

## 2019-03-14 ENCOUNTER — Encounter: Payer: Self-pay | Admitting: Family Medicine

## 2019-03-14 ENCOUNTER — Ambulatory Visit (INDEPENDENT_AMBULATORY_CARE_PROVIDER_SITE_OTHER): Payer: BC Managed Care – PPO | Admitting: Family Medicine

## 2019-03-14 VITALS — BP 120/64 | HR 75 | Ht <= 58 in | Wt 107.0 lb

## 2019-03-14 DIAGNOSIS — M25562 Pain in left knee: Secondary | ICD-10-CM

## 2019-03-14 DIAGNOSIS — H5211 Myopia, right eye: Secondary | ICD-10-CM | POA: Diagnosis not present

## 2019-03-14 DIAGNOSIS — L03116 Cellulitis of left lower limb: Secondary | ICD-10-CM | POA: Diagnosis not present

## 2019-03-14 DIAGNOSIS — M7042 Prepatellar bursitis, left knee: Secondary | ICD-10-CM | POA: Diagnosis not present

## 2019-03-14 DIAGNOSIS — H35073 Retinal telangiectasis, bilateral: Secondary | ICD-10-CM | POA: Diagnosis not present

## 2019-03-14 MED ORDER — DOXYCYCLINE HYCLATE 100 MG PO TABS: 100.0000 mg | ORAL_TABLET | Freq: Two times a day (BID) | ORAL | 0 refills | Status: AC

## 2019-03-14 MED ORDER — CEFDINIR 300 MG PO CAPS: 300.0000 mg | ORAL_CAPSULE | Freq: Two times a day (BID) | ORAL | 0 refills | Status: AC

## 2019-03-14 NOTE — Progress Notes (Signed)
Subjective:    I'm seeing this patient as a consultation for:  Jodi Mourning, FNP. Note will be routed back to referring provider/PCP.  CC: L knee pain  I, Molly Weber, LAT, ATC, am serving as scribe for Dr. Lynne Leader.  HPI: Pt is a 79 y/o female presenting w/ c/o L anterior knee pain, redness and swelling after suffering a fall last week, landing on her L anterior knee on a concrete step.  Pt was seen at an Urgent Care on 03/12/19 and placed on Bactrim and then followed up w/ Jodi Mourning yesterday who referred her to Sports Medicine.  Pt did not have an x-ray of her L knee yet.  Since being seen at Urgent Care, pt reports worsened L anterior knee pain.  She rates her L anterior knee pain at a 9/10 and describes it as aching pain.  Aggravating factors include knee flexion, pressure to the area and climbing stairs.  She is having redness, warmth and swelling in the L anterior knee.  She denies any radiating pain but does have swelling in her proximal L lower leg.  She was prescribed Bactrim in urgent care and stopped taking it after it was not helpful and was given a IM injection of ceftriaxone yesterday in primary care.  She notes this has not helped her pain very much at all.  She denies any extending redness or worsening swelling.  No fevers or chills.  Past medical history, Surgical history, Family history not pertinant except as noted below, Social history, Allergies, and medications have been entered into the medical record, reviewed, and no changes needed.   Review of Systems: No headache, visual changes, nausea, vomiting, diarrhea, constipation, dizziness, abdominal pain, skin rash, fevers, chills, night sweats, weight loss, swollen lymph nodes, body aches, joint swelling, muscle aches, chest pain, shortness of breath, mood changes, visual or auditory hallucinations.   Objective:    Vitals:   03/14/19 1503  BP: 120/64  Pulse: 75  SpO2: 96%   General: Well Developed, well nourished,  and in no acute distress.  Neuro/Psych: Alert and oriented x3, extra-ocular muscles intact, able to move all 4 extremities, sensation grossly intact. Skin: Warm and dry, no rashes noted.  Respiratory: Not using accessory muscles, speaking in full sentences, trachea midline.  Cardiovascular: Pulses palpable, no extremity edema. Abdomen: Does not appear distended. MSK: Left anterior knee with erythema and swelling overlying patella.  Tender to palpation.  Normal knee motion.  Intact flexion and extension strength.  Lab and Radiology Results  Limited musculoskeletal ultrasound left knee reveals hypoechoic fluid collecting overlying patella with surrounding the tissue subcutaneous marbling consistent with abscess with resulting cellulitis.  Cortex of patella normal-appearing intact quad and patellar tendons. Impression: Septic prepatellar bursitis  Abscess incision and drainage (left knee septic prepatellar bursitis) Consent obtained and timeout performed. Skin cleaned with Betadine 4 mL of lidocaine with epinephrine injected achieving good anesthesia. Skin was again cleaned with alcohol. A sharp incision was made to the area of fluctuance. The incision was widened and pus was expressed. Pus was cultured. Blunt dissection was used to break up loculations. Further pus was expressed. Patient tolerated the procedure well. A dressing was applied   Impression and Recommendations:    Assessment and Plan: 79 y.o. female with left knee septic prepatellar bursitis and an anterior knee cellulitis.  Fortunately no evidence of patellar fracture or quad or patellar tendon disruption or intra-articular infection.  Patient is already had exposure to oral Bactrim antibiotics as  well as IM ceftriaxone.  Plan for incision and drainage of bursitis/abscess.  Treat empirically with oral doxycycline and Omnicef for double coverage to include MRSA/staph and strep species. Will obtain culture however it is possible  to get a false negative in the setting of exposure to antibiotics prior to culture.   Recheck in early next week or return sooner if needed.  Patient expresses understanding and agreement.  PDMP not reviewed this encounter. Orders Placed This Encounter  Procedures  . Culture, routine-abscess    Order Specific Question:   Release to patient    Answer:   Immediate  . Wound culture    Order Specific Question:   Source    Answer:   L knee  . [place] - LOWER Extremity - Limited - LEFT    Order Specific Question:   Reason for Exam (SYMPTOM  OR DIAGNOSIS REQUIRED)    Answer:   L knee pain    Order Specific Question:   Preferred imaging location?    Answer:   [facility]   Meds ordered this encounter  Medications  . doxycycline (VIBRA-TABS) 100 MG tablet    Sig: Take 1 tablet (100 mg total) by mouth 2 (two) times daily for 7 days.    Dispense:  14 tablet    Refill:  0  . cefdinir (OMNICEF) 300 MG capsule    Sig: Take 1 capsule (300 mg total) by mouth 2 (two) times daily for 7 days.    Dispense:  14 capsule    Refill:  0    Discussed warning signs or symptoms. Please see discharge instructions. Patient expresses understanding.   The above documentation has been reviewed and is accurate and complete Lynne Leader

## 2019-03-14 NOTE — Patient Instructions (Signed)
Thank you for coming in today. Start both doxycycline and Cefdinir today and take twice daily. Take with some food but not with dairy or calcium or iron or a multivitamin. Recheck Monday or Tuesday or sooner if not doing well.    Skin Abscess  A skin abscess is an infected area on or under your skin that contains a collection of pus and other material. An abscess may also be called a furuncle, carbuncle, or boil. An abscess can occur in or on almost any part of your body. Some abscesses break open (rupture) on their own. Most continue to get worse unless they are treated. The infection can spread deeper into the body and eventually into your blood, which can make you feel ill. Treatment usually involves draining the abscess. What are the causes? An abscess occurs when germs, like bacteria, pass through your skin and cause an infection. This may be caused by:  A scrape or cut on your skin.  A puncture wound through your skin, including a needle injection or insect bite.  Blocked oil or sweat glands.  Blocked and infected hair follicles.  A cyst that forms beneath your skin (sebaceous cyst) and becomes infected. What increases the risk? This condition is more likely to develop in people who:  Have a weak body defense system (immune system).  Have diabetes.  Have dry and irritated skin.  Get frequent injections or use illegal IV drugs.  Have a foreign body in a wound, such as a splinter.  Have problems with their lymph system or veins. What are the signs or symptoms? Symptoms of this condition include:  A painful, firm bump under the skin.  A bump with pus at the top. This may break through the skin and drain. Other symptoms include:  Redness surrounding the abscess site.  Warmth.  Swelling of the lymph nodes (glands) near the abscess.  Tenderness.  A sore on the skin. How is this diagnosed? This condition may be diagnosed based on:  A physical exam.  Your  medical history.  A sample of pus. This may be used to find out what is causing the infection.  Blood tests.  Imaging tests, such as an ultrasound, CT scan, or MRI. How is this treated? A small abscess that drains on its own may not need treatment. Treatment for larger abscesses may include:  Moist heat or heat pack applied to the area several times a day.  A procedure to drain the abscess (incision and drainage).  Antibiotic medicines. For a severe abscess, you may first get antibiotics through an IV and then change to antibiotics by mouth. Follow these instructions at home: Medicines   Take over-the-counter and prescription medicines only as told by your health care provider.  If you were prescribed an antibiotic medicine, take it as told by your health care provider. Do not stop taking the antibiotic even if you start to feel better. Abscess care   If you have an abscess that has not drained, apply heat to the affected area. Use the heat source that your health care provider recommends, such as a moist heat pack or a heating pad. ? Place a towel between your skin and the heat source. ? Leave the heat on for 20-30 minutes. ? Remove the heat if your skin turns bright red. This is especially important if you are unable to feel pain, heat, or cold. You may have a greater risk of getting burned.  Follow instructions from your health care provider  about how to take care of your abscess. Make sure you: ? Cover the abscess with a bandage (dressing). ? Change your dressing or gauze as told by your health care provider. ? Wash your hands with soap and water before you change the dressing or gauze. If soap and water are not available, use hand sanitizer.  Check your abscess every day for signs of a worsening infection. Check for: ? More redness, swelling, or pain. ? More fluid or blood. ? Warmth. ? More pus or a bad smell. General instructions  To avoid spreading the  infection: ? Do not share personal care items, towels, or hot tubs with others. ? Avoid making skin contact with other people.  Keep all follow-up visits as told by your health care provider. This is important. Contact a health care provider if you have:  More redness, swelling, or pain around your abscess.  More fluid or blood coming from your abscess.  Warm skin around your abscess.  More pus or a bad smell coming from your abscess.  A fever.  Muscle aches.  Chills or a general ill feeling. Get help right away if you:  Have severe pain.  See red streaks on your skin spreading away from the abscess. Summary  A skin abscess is an infected area on or under your skin that contains a collection of pus and other material.  A small abscess that drains on its own may not need treatment.  Treatment for larger abscesses may include having a procedure to drain the abscess and taking an antibiotic. This information is not intended to replace advice given to you by your health care provider. Make sure you discuss any questions you have with your health care provider. Document Revised: 06/16/2018 Document Reviewed: 04/08/2017 Elsevier Patient Education  [place] Bursitis  Prepatellar bursitis is inflammation of the prepatellar bursa, which is a fluid-filled sac that cushions the kneecap (patella). Prepatellar bursitis happens when fluid builds up in this sac and causes it to swell. The condition causes knee pain. What are the causes? This condition may be caused by:  Constant pressure on the knees from kneeling.  A hit to the knee.  Falling on the knee.  Infection from bacteria.  Moving the knee often in a forceful way. What increases the risk? You are more likely to develop this condition if:  You play sports that have a high risk of falling on the knee or being hit on the knee. These include football, wrestling, basketball, or soccer.  You do work  in which you kneel for long periods of time, such as roofing, plumbing, or gardening.  You have another inflammatory condition, such as gout or rheumatoid arthritis. What are the signs or symptoms? The most common symptom of this condition is knee pain that gets better with rest. Other symptoms include:  Swelling on the front of the kneecap.  Warmth in the knee.  Tenderness with activity.  Redness in the knee.  Inability to bend the knee or to kneel. How is this diagnosed? This condition is diagnosed based on:  A physical exam. Your health care provider will compare your knees and check for tenderness and pain while moving your knee.  Your medical history.  Tests to check for infection. These may include blood tests and tests on the fluid in the bursa.  Imaging tests, such as X-ray, MRI, or ultrasound, to check for damage in the patella, or fluid buildup and swelling in the bursa.  How is this treated? This condition may be treated by:  Resting the knee.  Putting ice on the knee.  Taking medicines, such as: ? NSAIDs. These can help to reduce pain and swelling. ? Antibiotics. These may be needed if you have an infection. ? Steroids. These are used to reduce swelling and inflammation, and may be prescribed if other treatments are not helping.  Raising (elevating) the knee while resting.  Doing exercises to help you maintain movement (physical therapy). These may be recommended after pain and swelling improve.  Having a procedure to remove fluid from the bursa. This may be done if other treatments are not helping.  Having surgery to remove the bursa. This may be done if you have a severe infection or if the condition keeps coming back after treatment. Follow these instructions at home: Medicines  Take over-the-counter and prescription medicines only as told by your health care provider.  If you were prescribed an antibiotic medicine, take it as told by your health care  provider. Do not stop taking the antibiotic even if you start to feel better. Managing pain, stiffness, and swelling   If directed, put ice on the injured area. ? Put ice in a plastic bag. ? Place a towel between your skin and the bag. ? Leave the ice on for 20 minutes, 2-3 times a day.  Elevate the injured area above the level of your heart while you are sitting or lying down. Activity  Do not use the injured limb to support your body weight until your health care provider says that you can.  Rest your knee.  Avoid activities that cause knee pain.  Return to your normal activities as told by your health care provider. Ask your health care provider what activities are safe for you.  Do exercises as told by your health care provider. General instructions  Ask your health care provider when it is safe for you to drive.  Do not use any products that contain nicotine or tobacco, such as cigarettes, e-cigarettes, and chewing tobacco. These can delay healing. If you need help quitting, ask your health care provider.  Keep all follow-up visits as told by your health care provider. This is important. How is this prevented?  Warm up and stretch before being active.  Cool down and stretch after being active.  Give your body time to rest between periods of activity.  Maintain physical fitness, including strength and flexibility.  Be safe and responsible while being active. This will help you to avoid falls.  Wear knee pads if you have to kneel for a long period of time. Contact a health care provider if:  Your symptoms do not improve or get worse.  Your symptoms keep coming back after treatment.  You develop a fever and have warmth, redness, or swelling over your knee. Summary  Prepatellar bursitis is inflammation of the prepatellar bursa, which is a fluid-filled sac that cushions the kneecap (patella).  This condition may be caused by injury or constant pressure on the knee.  It may also be caused by an infection from bacteria.  Symptoms of this condition include pain, swelling, warmth, and tenderness in the knee.  Follow instructions from your health care provider about taking medicines, resting, and doing activities.  Contact your health care provider if your symptoms do not improve, get worse, or keep coming back after treatment. This information is not intended to replace advice given to you by your health care provider. Make sure you  discuss any questions you have with your health care provider. Document Revised: 06/17/2018 Document Reviewed: 05/05/2018 Elsevier Patient Education  [place].

## 2019-03-17 ENCOUNTER — Telehealth: Payer: Self-pay | Admitting: Family Medicine

## 2019-03-17 ENCOUNTER — Other Ambulatory Visit: Payer: Self-pay | Admitting: Family Medicine

## 2019-03-17 ENCOUNTER — Ambulatory Visit (INDEPENDENT_AMBULATORY_CARE_PROVIDER_SITE_OTHER): Payer: BC Managed Care – PPO | Admitting: Family Medicine

## 2019-03-17 ENCOUNTER — Other Ambulatory Visit: Payer: Self-pay

## 2019-03-17 ENCOUNTER — Encounter: Payer: Self-pay | Admitting: Family Medicine

## 2019-03-17 ENCOUNTER — Telehealth: Payer: Self-pay | Admitting: Internal Medicine

## 2019-03-17 VITALS — BP 156/74 | HR 71 | Ht <= 58 in | Wt 104.8 lb

## 2019-03-17 DIAGNOSIS — M7042 Prepatellar bursitis, left knee: Secondary | ICD-10-CM

## 2019-03-17 DIAGNOSIS — G8929 Other chronic pain: Secondary | ICD-10-CM

## 2019-03-17 DIAGNOSIS — M5440 Lumbago with sciatica, unspecified side: Secondary | ICD-10-CM

## 2019-03-17 DIAGNOSIS — M25562 Pain in left knee: Secondary | ICD-10-CM

## 2019-03-17 LAB — WOUND CULTURE
MICRO NUMBER:: 10008551
SPECIMEN QUALITY:: ADEQUATE

## 2019-03-17 LAB — HOUSE ACCOUNT TRACKING

## 2019-03-17 MED ORDER — HYDROCODONE-ACETAMINOPHEN 10-325 MG PO TABS: 2.0000 | ORAL_TABLET | Freq: Two times a day (BID) | ORAL | 0 refills | Status: DC

## 2019-03-17 NOTE — Patient Instructions (Signed)
Thank you for coming in today. I am happy with how your knee looks.  Continue antibiotics.  Recheck as scheduled on Tuesday.  Let me know sooner if not doing well.  My cell is [phone]

## 2019-03-17 NOTE — Telephone Encounter (Signed)
Patient called stating that the abscess that she had drained 03/14/2019 at her last appointment is still pretty red and painful. She was concerned about it and wanted to make sure that she should not do anything to it before the weekend.  Please advise.

## 2019-03-17 NOTE — Telephone Encounter (Signed)
Medication Refill - Medication: HYDROcodone-acetaminophen ([place]) 10-325 MG tablet    Preferred Pharmacy (with phone number or street name):  [facility] [place], [place] Phone:  [phone]  Fax:  [phone]       Agent: Please be advised that RX refills may take up to 3 business days. We ask that you follow-up with your pharmacy.

## 2019-03-17 NOTE — Telephone Encounter (Signed)
Pt coming in today at 12pm for f/u visit.

## 2019-03-17 NOTE — Progress Notes (Signed)
   I, Wendy Poet, LAT, ATC, am serving as scribe for Dr. Lynne Leader.  Tonya [name] is a 79 y.o. female who presents to [place] at [facility] today for f/u L anterior knee / patellar pain.  Pt was last seen by Dr. Georgina Snell on 03/14/19 and had an incision and drainage of an abscess on her L anterior knee.  Since Monday, she states that the redness and swelling on her L anterior knee has improved somewhat but reports still being in a considerable amount of pain.  She continues to take the cefdinir 300mg  bid and the doxycycline 100mg  bid as prescribed by Dr. Georgina Snell.  Pt rates her pain at a 7/10 today.  She states that her pain isn't too bad until she tries to walk or if anything puts pressure on her L anterior knee.  She states that her L anterior knee is draining somewhat but not as much as she thought it would.     ROS:  As above  Exam:  BP (!) 156/74 (BP Location: Left Arm, Patient Position: Sitting, Cuff Size: Normal)   Pulse 71   Ht 4\' 9"  (1.448 m)   Wt 104 lb 12.8 oz (47.5 kg)   SpO2 97%   BMI 22.68 kg/m   MSK: Left anterior knee with erythema and slight swelling.  Significant less erythema and swelling compared to visit on January 5. Knee range of motion intact. Mild antalgic gait.      Assessment and Plan: 79 y.o. female with septic prepatellar bursitis left knee.  Status post incision and drainage on January 5.  Significant improvement but patient does continue to have some pain.  On exam she has a small amount of fluid collection overlying the patella again.  Discussed options.  We will plan to continue oral antibiotics and reassess as scheduled on Tuesday, January 12.  Patient will notify me sooner if not doing well.    Discussed warning signs or symptoms. Please see discharge instructions. Patient expresses understanding.  The above documentation has been reviewed and is accurate and complete Lynne Leader

## 2019-03-17 NOTE — Telephone Encounter (Signed)
Routing to dr burns, please advise, thanks 

## 2019-03-20 NOTE — Progress Notes (Signed)
Wound culture is growing Staph aureus bacteria sensitive to the doxycycline antibiotic I picked.

## 2019-03-21 ENCOUNTER — Ambulatory Visit (INDEPENDENT_AMBULATORY_CARE_PROVIDER_SITE_OTHER): Payer: BC Managed Care – PPO | Admitting: Family Medicine

## 2019-03-21 ENCOUNTER — Ambulatory Visit: Payer: Self-pay

## 2019-03-21 ENCOUNTER — Encounter: Payer: Self-pay | Admitting: Family Medicine

## 2019-03-21 ENCOUNTER — Other Ambulatory Visit: Payer: Self-pay

## 2019-03-21 VITALS — BP 142/76 | HR 75 | Ht <= 58 in | Wt 104.0 lb

## 2019-03-21 DIAGNOSIS — M7042 Prepatellar bursitis, left knee: Secondary | ICD-10-CM

## 2019-03-21 NOTE — Patient Instructions (Signed)
Thank you for coming in today. Try to finish the course of doxycycline.  Try taking it with some food. Avoid dairy and calclium and iron with it.  If you cannot tolerate it then consider restarting the bactrim.   Keep me updated.  OK to start using voltaren gel over the counter 4x daily over the counter.   Keep me updated.

## 2019-03-21 NOTE — Progress Notes (Signed)
I, Wendy Poet, LAT, ATC, am serving as scribe for Dr. Lynne Leader.  Tonya [name] is a 79 y.o. female who presents to [place] at [facility] today for f/u of L anterior knee pain and abrasion.  She was last seen by Dr. Georgina Snell for f/u of an infected abrasion on her L anterior knee on 03/17/19.  She had an incision and drainage of her L anterior knee on 03/14/19 and has been prescribed both Cefdinir 300mg  bid and Doxycycline 100mg  bid.  She has had a hard time taking the doxycycline due to nausea.  She will complete her Omnicef antibiotic course tomorrow but still has multiple pills of doxycycline left.  Since her last visit, pt reports improvement in her L anterior knee pain.  She con't to have pain when ascending/descending stairs.  She rates her pain at a 4-5/10 at it's worst.  She con't to have some redness at her R anterior knee and into the proximal lower leg.  She con't to take both of her antibiotics.   Relevant historical information: No history of immunodeficiency.   ROS:  As above  Exam:  BP (!) 142/76 (BP Location: Left Arm, Patient Position: Sitting, Cuff Size: Normal)   Pulse 75   Ht 4\' 9"  (1.448 m)   Wt 104 lb (47.2 kg)   SpO2 97%   BMI 22.51 kg/m   MSK: Left anterior knee skin continues to have some erythema however less compared to prior visit.  Slightly swollen anterior knee.  Mildly tender anterior knee. Motion intact.  Strength intact extension.    Lab and Radiology Results  Limited musculoskeletal ultrasound left anterior knee reveals normal-appearing intact quad tendon, patella and patellar tendon and anterior tibia. Soft tissue hypoechoic change marbling and soft tissue overlying anterior knee structures.  Small collection of hypoechoic fluid at anterior lateral knee just lateral to the patellar tendon.  Not particularly tender to palpation with ultrasound probe. Normal bony structures   WOUND CULTURE Order: [id number] Status:  Final  result Visible to patient:  Yes (MyChart) Next appt:  05/09/2019 at 01:45 PM in Internal Medicine [name], MD)    Component 4 d ago  MICRO NUMBER: [id number]   SPECIMEN QUALITY: Adequate   SOURCE: L KNEE   STATUS: FINAL   GRAM STAIN: Many White blood cells seen No epithelial cells seen Few Gram positive cocci in clusters   ISOLATE 1: Staphylococcus aureusAbnormal    Comment: Heavy growth of Staphylococcus aureus This isolate demonstrates inducible clindamycin resistance.  Resulting Agency [facility]  Susceptibility   Staphylococcus aureus    AEROBIC CULT, GRAM STAIN POSITIVE 1    CIPROFLOXACIN <=0.5  Sensitive    CLINDAMYCIN NR  Resistant    ERYTHROMYCIN >=8  Resistant    GENTAMICIN <=0.5  Sensitive    LEVOFLOXACIN 0.25  Sensitive    OXACILLIN <=0.25  Sensitive1    TETRACYCLINE <=1  Sensitive    TRIMETH/SULFA <=10  Sensitive2    VANCOMYCIN 1  Sensitive         1 Oxacillin-susceptible staphylococci are  susceptible to other penicillinase-stable  penicillins (e.g. Methicillin, Nafcillin), beta-  lactam/beta-lactamase inhibitor combinations, and  cephems with staphylococcal indications, including  Cefazolin.  2 Legend:  S = Susceptible I = Intermediate  R = Resistant NS = Not susceptible  * = Not tested NR = Not reported  **NN = See antimicrobic comments            Assessment and Plan: 79  y.o. female with left knee prepatellar bursitis.  Significant improvement.  Patient continues to have some swelling and redness although much less.  She has been exposed to multiple different antibiotics at the staff aureus bacteria cultured above should be sensitive to.  Given that she still has some fluid collection is still having some pain I think it is reasonable for her to try to finish out her doxycycline course.  Advised taking it with a little bit of food to avoid nausea.  If you still cannot tolerate she has a bunch of leftover Bactrim that she can take as well that should be  effective. Patient will see me an update how she is doing.  Recommend topical diclofenac gel and continued exercise.   PDMP not reviewed this encounter. Orders Placed This Encounter  Procedures  . No Chg  [place] Lower Lt    Order Specific Question:   Reason for Exam (SYMPTOM  OR DIAGNOSIS REQUIRED)    Answer:   left prepat burs    Order Specific Question:   Preferred imaging location?    Answer:   [place]   No orders of the defined types were placed in this encounter.    Discussed warning signs or symptoms. Please see discharge instructions. Patient expresses understanding.  The above documentation has been reviewed and is accurate and complete Lynne Leader

## 2019-03-24 ENCOUNTER — Other Ambulatory Visit: Payer: Self-pay | Admitting: Internal Medicine

## 2019-04-24 ENCOUNTER — Other Ambulatory Visit: Payer: Self-pay | Admitting: Internal Medicine

## 2019-04-26 ENCOUNTER — Telehealth: Payer: Self-pay

## 2019-04-26 DIAGNOSIS — G8929 Other chronic pain: Secondary | ICD-10-CM

## 2019-04-26 MED ORDER — HYDROCODONE-ACETAMINOPHEN 10-325 MG PO TABS: 2.0000 | ORAL_TABLET | Freq: Two times a day (BID) | ORAL | 0 refills | Status: DC

## 2019-04-26 NOTE — Telephone Encounter (Signed)
Last OV was 02/08/19 Next OV 05/09/19 Last RF 03/17/19

## 2019-04-26 NOTE — Telephone Encounter (Signed)
Medication Requested: HYDROcodone-acetaminophen (NORCO) 10-325 MG tablet  Is medication on med list: Yes  (if no, inform pt they may need an appointment)  Is medication a controled: Yes   (yes = last OV with PCP)  -Controlled Substances: Adderall, Ritalin, oxycodone, hydrocodone, methadone, alprazolam, etc  Last visit with PCP: Last seen Dr. Senaida Ores  02/08/2019  Is the OV > than 4 months: (yes = schedule an appt if one is not already made)  [facility] [place] [place] [place]

## 2019-05-08 NOTE — Progress Notes (Signed)
Subjective:    Patient ID: Tonya [name], female    DOB: 08/24/1940, 79 y.o.   MRN: [id number]  HPI The patient is here for follow up of their chronic medical problems   The patient is here for follow up for chronic pain management.  Indication for chronic opioid: chronic lower back pain Medication and dose: norco 10-325 mg 1-2 tabs BID prn # pills per month: 120 Last UDS date: 05/09/2019 Pain contract signed (Y/N): 05/09/2019 Date narcotic database last reviewed : 05/09/2019  Pain assessment:  Pain intensity: high after moderate activity, tolerable the rest of the time  Amount of pain relief with medication: very good Use of pain medications: 3-4 pills / day depending on activity Side effects: none Sleep:  Ok - frequent urination and dog affect sleep Mood: depression, anxiety controlled Functional/social activities: continues to be active in home setting.  Sleeping too much during the day.  Think this is somewhat related to covid pandemic and being less active overall.   Last took prescribed pain medication: today Alcohol use:  rare Tobacco use:  no Street Drug use:   no  Hypothyroidism:  She is taking her medication daily.  She denies any recent changes in energy or weight that are unexplained.  She has some sweating at night.    Medications and allergies reviewed with patient and updated if appropriate.  Patient Active Problem List   Diagnosis Date Noted  . Urinary frequency 10/18/2018  . Leukopenia 10/04/2018  . Thrombocytopenia ([place]) 10/04/2018  . Acute gastroenteritis 10/04/2018  . Nausea vomiting and diarrhea   . Arthralgia 08/24/2018  . Hypertension 08/23/2018  . DOE (dyspnea on exertion) 01/07/2018  . BRBPR (bright red blood per rectum) 01/07/2018  . Acute pain of right shoulder 01/07/2018  . Pain management contract signed, 01/07/18 01/07/2018  . Osteoarthritis of hand 07/05/2017  . Gross hematuria 10/15/2016  . Prediabetes 09/21/2016  . Encounter  for chronic pain management 05/27/2016  . Fatigue 01/15/2016  . Chronic lower back pain 01/15/2016  . Osteopenia, high FRAX 01/14/2016  . DDD (degenerative disc disease), cervical S/P Decompressive laminectomy and Fusion 11/15/2012  . Asthma 11/15/2012  . Hypothyroidism 11/15/2012  . Depression 11/15/2012  . Constipation 11/15/2012  . Overactive bladder 11/15/2012  . Anxiety 11/15/2012  . Cervical spinal stenosis 10/24/2012  . Fracture of cervical vertebra ([place]) 08/31/2012  . Scoliosis 07/05/2012  . Degeneration of intervertebral disc of lumbosacral region 07/04/2012  . GERD (gastroesophageal reflux disease) 06/02/2007    Current Outpatient Medications on File Prior to Visit  Medication Sig Dispense Refill  . amLODipine (NORVASC) 2.5 MG tablet TAKE 1 TABLET BY MOUTH EVERY DAY 90 tablet 0  . budesonide-formoterol (SYMBICORT) 80-4.5 MCG/ACT inhaler TAKE 2 PUFFS BY MOUTH TWICE A DAY 30.6 Inhaler 5  . BYSTOLIC 2.5 MG tablet TAKE 1 TABLET BY MOUTH EVERY DAY 30 tablet 5  . Calcium Citrate 250 MG TABS Take 1 tablet by mouth daily.     . COLLAGEN PO Take by mouth.    . cycloSPORINE (RESTASIS) 0.05 % ophthalmic emulsion Place 1 drop into both eyes 2 (two) times daily.    Marland Kitchen dimenhyDRINATE (DRAMAMINE) 50 MG tablet Take 50 mg by mouth every 8 (eight) hours as needed for nausea.    . diphenhydrAMINE HCl (BENADRYL ALLERGY PO) Take 1 tablet by mouth daily as needed (sleep).     . DULoxetine (CYMBALTA) 30 MG capsule Take 1 capsule (30 mg total) by mouth daily. 90 capsule 1  .  famotidine (PEPCID) 40 MG tablet TAKE 1 TABLET BY MOUTH EVERY DAY 90 tablet 1  . fesoterodine (TOVIAZ) 8 MG TB24 tablet TAKE 1 TABLET (8 MG) DAILY. 30 tablet 2  . HYDROcodone-acetaminophen (NORCO) 10-325 MG tablet Take 2 tablets by mouth 2 (two) times daily. For chronic lower back and neck pain 120 tablet 0  . levalbuterol (XOPENEX HFA) 45 MCG/ACT inhaler Inhale 1-2 puffs into the lungs every 4 (four) hours as needed for  wheezing. 1 Inhaler 8  . Magnesium 125 MG CAPS Take 125 mg by mouth every evening.     Marland Kitchen omeprazole (PRILOSEC) 20 MG capsule Take 1 capsule (20 mg total) by mouth daily. Take 30 minutes before lunch. 30 capsule 5  . ondansetron (ZOFRAN ODT) 4 MG disintegrating tablet Take 1 tablet (4 mg total) by mouth every 8 (eight) hours as needed for nausea or vomiting. 20 tablet 0  . oxybutynin (DITROPAN-XL) 10 MG 24 hr tablet Take 1 tablet (10 mg total) by mouth at bedtime. 30 tablet 2  . sulfamethoxazole-trimethoprim (BACTRIM DS) 800-160 MG tablet Take 1 tablet by mouth 2 (two) times daily.    Marland Kitchen SYNTHROID 50 MCG tablet Take 1 tablet (50 mcg total) by mouth daily before breakfast. 90 tablet 1  . telmisartan (MICARDIS) 40 MG tablet TAKE 1 TABLET BY MOUTH EVERY DAY 90 tablet 1  . vitamin C (ASCORBIC ACID) 500 MG tablet Take 500 mg by mouth daily.    . vitamin E 100 UNIT capsule Take 100 Units by mouth daily.     No current facility-administered medications on file prior to visit.    Past Medical History:  Diagnosis Date  . Anemia   . Asthma   . Back pain, chronic   . Diverticulosis   . H/O arthrodesis 11/22/2012  . History of hysterectomy   . Hypertension   . Spinal stenosis   . Synovial cyst     Past Surgical History:  Procedure Laterality Date  . ABDOMINAL HYSTERECTOMY    . BACK SURGERY    . CYST REMOVAL TRUNK      Social History   Socioeconomic History  . Marital status: Widowed    Spouse name: Not on file  . Number of children: Not on file  . Years of education: Not on file  . Highest education level: Not on file  Occupational History  . Not on file  Tobacco Use  . Smoking status: Never Smoker  . Smokeless tobacco: Never Used  Substance and Sexual Activity  . Alcohol use: Yes    Comment: socially  . Drug use: No  . Sexual activity: Not on file  Other Topics Concern  . Not on file  Social History Narrative   Not currently exercising   Social Determinants of Health    Financial Resource Strain:   . Difficulty of Paying Living Expenses: Not on file  Food Insecurity:   . Worried About Charity fundraiser in the Last Year: Not on file  . Ran Out of Food in the Last Year: Not on file  Transportation Needs:   . Lack of Transportation (Medical): Not on file  . Lack of Transportation (Non-Medical): Not on file  Physical Activity:   . Days of Exercise per Week: Not on file  . Minutes of Exercise per Session: Not on file  Stress:   . Feeling of Stress : Not on file  Social Connections:   . Frequency of Communication with Friends and Family: Not on file  .  Frequency of Social Gatherings with Friends and Family: Not on file  . Attends Religious Services: Not on file  . Active Member of Clubs or Organizations: Not on file  . Attends Archivist Meetings: Not on file  . Marital Status: Not on file    Family History  Problem Relation Age of Onset  . Other Father        carotid artery disease  . Heart disease Brother   . Alcohol abuse Brother   . Breast cancer Neg Hx     Review of Systems  Constitutional: Positive for diaphoresis (sweats intemittently).  Genitourinary: Positive for frequency.  Musculoskeletal: Positive for back pain.  Psychiatric/Behavioral: Positive for dysphoric mood and sleep disturbance. The patient is nervous/anxious.        Objective:   Vitals:   05/09/19 1358  BP: 126/64  Pulse: 72  Resp: 16  Temp: 98.1 F (36.7 C)  SpO2: 99%   BP Readings from Last 3 Encounters:  05/09/19 126/64  03/21/19 (!) 142/76  03/17/19 (!) 156/74   Wt Readings from Last 3 Encounters:  05/09/19 103 lb (46.7 kg)  03/21/19 104 lb (47.2 kg)  03/17/19 104 lb 12.8 oz (47.5 kg)   Body mass index is 22.29 kg/m.   Physical Exam    Constitutional: Appears well-developed and well-nourished. No distress.   Skin: Skin is warm and dry. Not diaphoretic.  Psychiatric: Normal mood and affect. Behavior is normal.      Assessment &  Plan:    See Problem List for Assessment and Plan of chronic medical problems.    This visit occurred during the SARS-CoV-2 public health emergency.  Safety protocols were in place, including screening questions prior to the visit, additional usage of staff PPE, and extensive cleaning of exam room while observing appropriate contact time as indicated for disinfecting solutions.

## 2019-05-08 NOTE — Patient Instructions (Addendum)
  Blood work was ordered.     Medications reviewed and updated.  Changes include :   none       Please followup in 3 months   

## 2019-05-09 ENCOUNTER — Encounter: Payer: Self-pay | Admitting: Internal Medicine

## 2019-05-09 ENCOUNTER — Other Ambulatory Visit: Payer: Self-pay

## 2019-05-09 ENCOUNTER — Ambulatory Visit (INDEPENDENT_AMBULATORY_CARE_PROVIDER_SITE_OTHER): Payer: Medicare Other | Admitting: Internal Medicine

## 2019-05-09 VITALS — BP 126/64 | HR 72 | Temp 98.1°F | Resp 16 | Ht <= 58 in | Wt 103.0 lb

## 2019-05-09 DIAGNOSIS — G8929 Other chronic pain: Secondary | ICD-10-CM | POA: Diagnosis not present

## 2019-05-09 DIAGNOSIS — E039 Hypothyroidism, unspecified: Secondary | ICD-10-CM

## 2019-05-09 DIAGNOSIS — M545 Low back pain, unspecified: Secondary | ICD-10-CM

## 2019-05-09 LAB — TSH: TSH: 2.4 u[IU]/mL (ref 0.35–4.50)

## 2019-05-09 NOTE — Assessment & Plan Note (Signed)
Chronic lower back pain Pain is controlled and stable Taking medication appropriately without side effects Continue current pain regimen-hydrocodone-acetaminophen 10-325 mg 2 tabs twice daily as needed-typically takes 3-4 pills daily [place] controlled substance database checked-refill not needed at this time Follow-up in 3 months

## 2019-05-09 NOTE — Assessment & Plan Note (Signed)
Chronic lower back pain due for UDS and new pain contract-we will complete today [place] controlled substance database checked-no concerning behavior Taking medication appropriately and medication is effective Overall pain is controlled Continue current pain regimen Follow-up in 3 months

## 2019-05-09 NOTE — Assessment & Plan Note (Signed)
Chronic  Clinically euthyroid -cold sweats at night sometimes, but not during the day-unlikely thyroid related Check tsh  Titrate med dose if needed

## 2019-05-11 LAB — PAIN MGMT, PROFILE 8 W/CONF, U
6 Acetylmorphine: NEGATIVE ng/mL
Alcohol Metabolites: NEGATIVE ng/mL (ref <500)
Amphetamines: NEGATIVE ng/mL
Benzodiazepines: NEGATIVE ng/mL
Buprenorphine, Urine: NEGATIVE ng/mL
Cocaine Metabolite: NEGATIVE ng/mL
Codeine: NEGATIVE ng/mL
Creatinine: 176.4 mg/dL
Hydrocodone: 2357 ng/mL
Hydromorphone: 539 ng/mL
MDMA: NEGATIVE ng/mL
Marijuana Metabolite: NEGATIVE ng/mL
Morphine: NEGATIVE ng/mL
Norhydrocodone: 3758 ng/mL
Opiates: POSITIVE ng/mL
Oxidant: NEGATIVE ug/mL
Oxycodone: NEGATIVE ng/mL
pH: 5.2 (ref 4.5–9.0)

## 2019-05-18 ENCOUNTER — Other Ambulatory Visit: Payer: Self-pay | Admitting: Internal Medicine

## 2019-06-01 ENCOUNTER — Other Ambulatory Visit: Payer: Self-pay | Admitting: Internal Medicine

## 2019-06-01 DIAGNOSIS — G8929 Other chronic pain: Secondary | ICD-10-CM

## 2019-06-01 DIAGNOSIS — M5440 Lumbago with sciatica, unspecified side: Secondary | ICD-10-CM

## 2019-06-01 NOTE — Telephone Encounter (Signed)
New message:   1.Medication Requested: HYDROcodone-acetaminophen (NORCO) 10-325 MG tablet 2. Pharmacy (Name, Street, [place]): [facility] [place] [place] 3. On Med List: Yes  4. Last Visit with PCP: 05/09/19  5. Next visit date with PCP:08/30/19   Agent: Please be advised that RX refills may take up to 3 business days. We ask that you follow-up with your pharmacy.

## 2019-06-02 MED ORDER — HYDROCODONE-ACETAMINOPHEN 10-325 MG PO TABS: 2.0000 | ORAL_TABLET | Freq: Two times a day (BID) | ORAL | 0 refills | Status: DC

## 2019-06-02 NOTE — Telephone Encounter (Signed)
Last OV 05/09/19 Next OV 08/30/19  Last RF 04/28/19

## 2019-07-10 ENCOUNTER — Other Ambulatory Visit: Payer: Self-pay | Admitting: Internal Medicine

## 2019-07-10 DIAGNOSIS — M5440 Lumbago with sciatica, unspecified side: Secondary | ICD-10-CM

## 2019-07-10 DIAGNOSIS — G8929 Other chronic pain: Secondary | ICD-10-CM

## 2019-07-10 NOTE — Telephone Encounter (Signed)
    1.Medication Requested: HYDROcodone-acetaminophen (NORCO) 10-325 MG tablet  2. Pharmacy (Name, Street, City):[facility] [place] [place] [address]  3. On Med List: yes  4. Last Visit with PCP: 05/09/19  5. Next visit date with PCP: 08/30/19   Agent: Please be advised that RX refills may take up to 3 business days. We ask that you follow-up with your pharmacy.

## 2019-07-11 DIAGNOSIS — H43811 Vitreous degeneration, right eye: Secondary | ICD-10-CM | POA: Diagnosis not present

## 2019-07-11 MED ORDER — HYDROCODONE-ACETAMINOPHEN 10-325 MG PO TABS: 2.0000 | ORAL_TABLET | Freq: Two times a day (BID) | ORAL | 0 refills | Status: DC

## 2019-07-11 NOTE — Telephone Encounter (Signed)
Last RF was 06/05/19

## 2019-07-14 NOTE — Telephone Encounter (Signed)
    Patient calling to report [facility] not [facility] HYDRO[facility]one-acetaminophen (NORCO) 10-325 MG tablet in stock , [facility] [employer]

## 2019-07-14 NOTE — Telephone Encounter (Signed)
[facility] [address] since her current pharmacy does not have it. Order pended

## 2019-07-14 NOTE — Telephone Encounter (Signed)
Patient returning call. Unsure who called. States that she got off the phone with office and then a few minutes later the office was calling her mobile. Please advise.

## 2019-07-14 NOTE — Addendum Note (Signed)
Addended by: Delice Bison E on: 07/14/2019 01:36 PM   Modules accepted: Orders

## 2019-07-15 MED ORDER — HYDROCODONE-ACETAMINOPHEN 10-325 MG PO TABS: 2.0000 | ORAL_TABLET | Freq: Two times a day (BID) | ORAL | 0 refills | Status: DC

## 2019-07-24 ENCOUNTER — Other Ambulatory Visit: Payer: Self-pay | Admitting: Internal Medicine

## 2019-08-01 DIAGNOSIS — H43813 Vitreous degeneration, bilateral: Secondary | ICD-10-CM | POA: Diagnosis not present

## 2019-08-06 ENCOUNTER — Other Ambulatory Visit: Payer: Self-pay | Admitting: Internal Medicine

## 2019-08-09 ENCOUNTER — Other Ambulatory Visit: Payer: Self-pay | Admitting: Internal Medicine

## 2019-08-11 ENCOUNTER — Other Ambulatory Visit: Payer: Self-pay | Admitting: Internal Medicine

## 2019-08-15 ENCOUNTER — Telehealth: Payer: Self-pay | Admitting: Internal Medicine

## 2019-08-15 DIAGNOSIS — G8929 Other chronic pain: Secondary | ICD-10-CM

## 2019-08-15 MED ORDER — HYDROCODONE-ACETAMINOPHEN 10-325 MG PO TABS: 2.0000 | ORAL_TABLET | Freq: Two times a day (BID) | ORAL | 0 refills | Status: DC

## 2019-08-15 NOTE — Telephone Encounter (Signed)
New message:   1.Medication Requested: HYDROcodone-acetaminophen (NORCO) 10-325 MG tablet 2. Pharmacy (Name, Street, [id number] [place] [place] 3. On Med List: Yes  4. Last Visit with PCP: 05/09/19  5. Next visit date with PCP: 08/30/19   Agent: Please be advised that RX refills may take up to 3 business days. We ask that you follow-up with your pharmacy.

## 2019-08-25 ENCOUNTER — Encounter: Payer: BC Managed Care – PPO | Admitting: Internal Medicine

## 2019-08-29 ENCOUNTER — Other Ambulatory Visit: Payer: Self-pay | Admitting: Internal Medicine

## 2019-08-29 NOTE — Progress Notes (Signed)
Subjective:    Patient ID: Tonya [name], female    DOB: 10-Apr-1940, 79 y.o.   MRN: [id number]  HPI She is here for a physical exam.    She has had a lot of tick bites.  This is nothing new.    She has had a few episodes of sudden need to throw up - sometimes it is just dry heaves.  She saw Dr Collene Mares over the winter and no cause was found.  She will also have sudden onset of diarrhea.     Medications and allergies reviewed with patient and updated if appropriate.  Patient Active Problem List   Diagnosis Date Noted  . Urinary frequency 10/18/2018  . Thrombocytopenia ([place]) 10/04/2018  . Acute gastroenteritis 10/04/2018  . Nausea vomiting and diarrhea   . Arthralgia 08/24/2018  . Hypertension 08/23/2018  . DOE (dyspnea on exertion) 01/07/2018  . BRBPR (bright red blood per rectum) 01/07/2018  . Acute pain of right shoulder 01/07/2018  . Pain management contract signed, 01/07/18 01/07/2018  . Osteoarthritis of hand 07/05/2017  . Prediabetes 09/21/2016  . Encounter for chronic pain management 05/27/2016  . Fatigue 01/15/2016  . Chronic lower back pain 01/15/2016  . Osteopenia, high FRAX 01/14/2016  . DDD (degenerative disc disease), cervical S/P Decompressive laminectomy and Fusion 11/15/2012  . Asthma 11/15/2012  . Hypothyroidism 11/15/2012  . Depression 11/15/2012  . Constipation 11/15/2012  . Overactive bladder 11/15/2012  . Anxiety 11/15/2012  . Cervical spinal stenosis 10/24/2012  . Fracture of cervical vertebra ([place]) 08/31/2012  . Scoliosis 07/05/2012  . Degeneration of intervertebral disc of lumbosacral region 07/04/2012  . GERD (gastroesophageal reflux disease) 06/02/2007    Current Outpatient Medications on File Prior to Visit  Medication Sig Dispense Refill  . amLODipine (NORVASC) 2.5 MG tablet TAKE 1 TABLET BY MOUTH EVERY DAY 90 tablet 0  . budesonide-formoterol (SYMBICORT) 80-4.5 MCG/ACT inhaler TAKE 2 PUFFS BY MOUTH TWICE A DAY 30.6 Inhaler 5  .  BYSTOLIC 2.5 MG tablet TAKE 1 TABLET BY MOUTH EVERY DAY 30 tablet 5  . Calcium Citrate 250 MG TABS Take 1 tablet by mouth daily.     . COLLAGEN PO Take by mouth.    . cycloSPORINE (RESTASIS) 0.05 % ophthalmic emulsion Place 1 drop into both eyes 2 (two) times daily.    Marland Kitchen dimenhyDRINATE (DRAMAMINE) 50 MG tablet Take 50 mg by mouth every 8 (eight) hours as needed for nausea.    . diphenhydrAMINE HCl (BENADRYL ALLERGY PO) Take 1 tablet by mouth daily as needed (sleep).     . DULoxetine (CYMBALTA) 30 MG capsule TAKE 1 CAPSULE (30 MG TOTAL) BY MOUTH DAILY. KEEP SCHEDULED APPT FOR FUTURE REFILLS 90 capsule 0  . famotidine (PEPCID) 40 MG tablet TAKE 1 TABLET BY MOUTH EVERY DAY 90 tablet 1  . HYDROcodone-acetaminophen (NORCO) 10-325 MG tablet Take 2 tablets by mouth 2 (two) times daily. For chronic lower back and neck pain 120 tablet 0  . levalbuterol (XOPENEX HFA) 45 MCG/ACT inhaler Inhale 1-2 puffs into the lungs every 4 (four) hours as needed for wheezing. 1 Inhaler 8  . Magnesium 125 MG CAPS Take 125 mg by mouth every evening.     Marland Kitchen omeprazole (PRILOSEC) 20 MG capsule TAKE 1 CAPSULE (20 MG TOTAL) BY MOUTH DAILY. TAKE 30 MINUTES BEFORE LUNCH. 30 capsule 5  . ondansetron (ZOFRAN ODT) 4 MG disintegrating tablet Take 1 tablet (4 mg total) by mouth every 8 (eight) hours as needed for nausea or vomiting.  20 tablet 0  . oxybutynin (DITROPAN-XL) 10 MG 24 hr tablet TAKE 1 TABLET BY MOUTH EVERYDAY AT BEDTIME 30 tablet 2  . SYNTHROID 50 MCG tablet TAKE 1 TABLET BY MOUTH DAILY BEFORE BREAKFAST 90 tablet 1  . telmisartan (MICARDIS) 40 MG tablet TAKE 1 TABLET BY MOUTH EVERY DAY 90 tablet 1  . vitamin C (ASCORBIC ACID) 500 MG tablet Take 500 mg by mouth daily.    . vitamin E 100 UNIT capsule Take 100 Units by mouth daily.     No current facility-administered medications on file prior to visit.    Past Medical History:  Diagnosis Date  . Anemia   . Asthma   . Back pain, chronic   . Diverticulosis   . H/O  arthrodesis 11/22/2012  . History of hysterectomy   . Hypertension   . Spinal stenosis   . Synovial cyst     Past Surgical History:  Procedure Laterality Date  . ABDOMINAL HYSTERECTOMY    . BACK SURGERY    . CYST REMOVAL TRUNK      Social History   Socioeconomic History  . Marital status: Widowed    Spouse name: Not on file  . Number of children: Not on file  . Years of education: Not on file  . Highest education level: Not on file  Occupational History  . Not on file  Tobacco Use  . Smoking status: Never Smoker  . Smokeless tobacco: Never Used  Substance and Sexual Activity  . Alcohol use: Yes    Comment: socially  . Drug use: No  . Sexual activity: Not on file  Other Topics Concern  . Not on file  Social History Narrative   Not currently exercising   Social Determinants of Health   Financial Resource Strain:   . Difficulty of Paying Living Expenses:   Food Insecurity:   . Worried About Charity fundraiser in the Last Year:   . Arboriculturist in the Last Year:   Transportation Needs:   . Film/video editor (Medical):   Marland Kitchen Lack of Transportation (Non-Medical):   Physical Activity:   . Days of Exercise per Week:   . Minutes of Exercise per Session:   Stress:   . Feeling of Stress :   Social Connections:   . Frequency of Communication with Friends and Family:   . Frequency of Social Gatherings with Friends and Family:   . Attends Religious Services:   . Active Member of Clubs or Organizations:   . Attends Archivist Meetings:   Marland Kitchen Marital Status:     Family History  Problem Relation Age of Onset  . Other Father        carotid artery disease  . Heart disease Brother   . Alcohol abuse Brother   . Breast cancer Neg Hx     Review of Systems  Constitutional: Negative for chills and fever.  Eyes: Negative for visual disturbance.  Respiratory: Positive for shortness of breath (with moderate exertion). Negative for cough and wheezing.     Cardiovascular: Negative for chest pain, palpitations and leg swelling.  Gastrointestinal: Positive for diarrhea (occ). Negative for abdominal pain, blood in stool, constipation and nausea.       GERD on occasion  Genitourinary: Negative for dysuria and hematuria.  Musculoskeletal: Positive for back pain.  Skin: Negative for rash.  Neurological: Negative for light-headedness and headaches.  Hematological: Bruises/bleeds easily.  Psychiatric/Behavioral: Negative for dysphoric mood. The patient is not  nervous/anxious.        Objective:   Vitals:   08/30/19 1040  BP: 128/70  Pulse: 75  Temp: 97.9 F (36.6 C)  SpO2: 97%   Filed Weights   08/30/19 1040  Weight: 105 lb 3.2 oz (47.7 kg)   Body mass index is 22.77 kg/m.  BP Readings from Last 3 Encounters:  08/30/19 128/70  05/09/19 126/64  03/21/19 (!) 142/76    Wt Readings from Last 3 Encounters:  08/30/19 105 lb 3.2 oz (47.7 kg)  05/09/19 103 lb (46.7 kg)  03/21/19 104 lb (47.2 kg)     Physical Exam Constitutional: She appears well-developed and well-nourished. No distress.  HENT:  Head: Normocephalic and atraumatic.  Right Ear: External ear normal. Normal ear canal and TM Left Ear: External ear normal.  Normal ear canal and TM Mouth/Throat: Oropharynx is clear and moist.  Eyes: Conjunctivae and EOM are normal.  Neck: Neck supple. No tracheal deviation present. No thyromegaly present.  No carotid bruit  Cardiovascular: Normal rate, regular rhythm and normal heart sounds.   No murmur heard.  No edema. Pulmonary/Chest: Effort normal and breath sounds normal. No respiratory distress. She has no wheezes. She has no rales.  Breast: deferred   Abdominal: Soft. She exhibits no distension. There is no tenderness.  Lymphadenopathy: She has no cervical adenopathy.  Skin: Skin is warm and dry. She is not diaphoretic.  Psychiatric: She has a normal mood and affect. Her behavior is normal.        Assessment & Plan:    Physical exam: Screening blood work    ordered Immunizations  had Covid, others Up to date  Colonoscopy  N/a - no longer needed Mammogram  N/a - no longer needed Dexa   Due  - ordered Eye exams  Up to date  Exercise   Yard work, some walking Weight  normal Substance abuse  None Sees derm  See Problem List for Assessment and Plan of chronic medical problems.   This visit occurred during the SARS-CoV-2 public health emergency.  Safety protocols were in place, including screening questions prior to the visit, additional usage of staff PPE, and extensive cleaning of exam room while observing appropriate contact time as indicated for disinfecting solutions.

## 2019-08-29 NOTE — Patient Instructions (Addendum)
Blood work was ordered.    All other Health Maintenance issues reviewed.   All recommended immunizations and age-appropriate screenings are up-to-date or discussed.  No immunization administered today.   Medications reviewed and updated.  Changes include :   Increase pantoprazole to 40 mg daily  Your prescription(s) have been submitted to your pharmacy. Please take as directed and contact our office if you believe you are having problem(s) with the medication(s).     Please followup in 3 months     Health Maintenance, Female Adopting a healthy lifestyle and getting preventive care are important in promoting health and wellness. Ask your health care provider about:  The right schedule for you to have regular tests and exams.  Things you can do on your own to prevent diseases and keep yourself healthy. What should I know about diet, weight, and exercise? Eat a healthy diet   Eat a diet that includes plenty of vegetables, fruits, low-fat dairy products, and lean protein.  Do not eat a lot of foods that are high in solid fats, added sugars, or sodium. Maintain a healthy weight Body mass index (BMI) is used to identify weight problems. It estimates body fat based on height and weight. Your health care provider can help determine your BMI and help you achieve or maintain a healthy weight. Get regular exercise Get regular exercise. This is one of the most important things you can do for your health. Most adults should:  Exercise for at least 150 minutes each week. The exercise should increase your heart rate and make you sweat (moderate-intensity exercise).  Do strengthening exercises at least twice a week. This is in addition to the moderate-intensity exercise.  Spend less time sitting. Even light physical activity can be beneficial. Watch cholesterol and blood lipids Have your blood tested for lipids and cholesterol at 78 years of age, then have this test every 5 years. Have  your cholesterol levels checked more often if:  Your lipid or cholesterol levels are high.  You are older than 78 years of age.  You are at high risk for heart disease. What should I know about cancer screening? Depending on your health history and family history, you may need to have cancer screening at various ages. This may include screening for:  Breast cancer.  Cervical cancer.  Colorectal cancer.  Skin cancer.  Lung cancer. What should I know about heart disease, diabetes, and high blood pressure? Blood pressure and heart disease  High blood pressure causes heart disease and increases the risk of stroke. This is more likely to develop in people who have high blood pressure readings, are of African descent, or are overweight.  Have your blood pressure checked: ? Every 3-5 years if you are 82-66 years of age. ? Every year if you are 24 years old or older. Diabetes Have regular diabetes screenings. This checks your fasting blood sugar level. Have the screening done:  Once every three years after age 51 if you are at a normal weight and have a low risk for diabetes.  More often and at a younger age if you are overweight or have a high risk for diabetes. What should I know about preventing infection? Hepatitis B If you have a higher risk for hepatitis B, you should be screened for this virus. Talk with your health care provider to find out if you are at risk for hepatitis B infection. Hepatitis C Testing is recommended for:  Everyone born from [birth year] through 1964.  Anyone with known risk factors for hepatitis C. Sexually transmitted infections (STIs)  Get screened for STIs, including gonorrhea and chlamydia, if: ? You are sexually active and are younger than 78 years of age. ? You are older than 78 years of age and your health care provider tells you that you are at risk for this type of infection. ? Your sexual activity has changed since you were last screened, and you  are at increased risk for chlamydia or gonorrhea. Ask your health care provider if you are at risk.  Ask your health care provider about whether you are at high risk for HIV. Your health care provider may recommend a prescription medicine to help prevent HIV infection. If you choose to take medicine to prevent HIV, you should first get tested for HIV. You should then be tested every 3 months for as long as you are taking the medicine. Pregnancy  If you are about to stop having your period (premenopausal) and you may become pregnant, seek counseling before you get pregnant.  Take 400 to 800 micrograms (mcg) of folic acid every day if you become pregnant.  Ask for birth control (contraception) if you want to prevent pregnancy. Osteoporosis and menopause Osteoporosis is a disease in which the bones lose minerals and strength with aging. This can result in bone fractures. If you are 19 years old or older, or if you are at risk for osteoporosis and fractures, ask your health care provider if you should:  Be screened for bone loss.  Take a calcium or vitamin D supplement to lower your risk of fractures.  Be given hormone replacement therapy (HRT) to treat symptoms of menopause. Follow these instructions at home: Lifestyle  Do not use any products that contain nicotine or tobacco, such as cigarettes, e-cigarettes, and chewing tobacco. If you need help quitting, ask your health care provider.  Do not use street drugs.  Do not share needles.  Ask your health care provider for help if you need support or information about quitting drugs. Alcohol use  Do not drink alcohol if: ? Your health care provider tells you not to drink. ? You are pregnant, may be pregnant, or are planning to become pregnant.  If you drink alcohol: ? Limit how much you use to 0-1 drink a day. ? Limit intake if you are breastfeeding.  Be aware of how much alcohol is in your drink. In the [place] one drink equals one 12  oz bottle of beer (355 mL), one 5 oz glass of wine (148 mL), or one 1 oz glass of hard liquor (44 mL). General instructions  Schedule regular health, dental, and eye exams.  Stay current with your vaccines.  Tell your health care provider if: ? You often feel depressed. ? You have ever been abused or do not feel safe at home. Summary  Adopting a healthy lifestyle and getting preventive care are important in promoting health and wellness.  Follow your health care provider's instructions about healthy diet, exercising, and getting tested or screened for diseases.  Follow your health care provider's instructions on monitoring your cholesterol and blood pressure. This information is not intended to replace advice given to you by your health care provider. Make sure you discuss any questions you have with your health care provider. Document Revised: 02/16/2018 Document Reviewed: 02/16/2018 Elsevier Patient Education  2020 [employer].

## 2019-08-30 ENCOUNTER — Ambulatory Visit (INDEPENDENT_AMBULATORY_CARE_PROVIDER_SITE_OTHER): Payer: Medicare Other | Admitting: Internal Medicine

## 2019-08-30 ENCOUNTER — Encounter: Payer: Self-pay | Admitting: Internal Medicine

## 2019-08-30 ENCOUNTER — Other Ambulatory Visit: Payer: Self-pay

## 2019-08-30 VITALS — BP 128/70 | HR 75 | Temp 97.9°F | Ht <= 58 in | Wt 105.2 lb

## 2019-08-30 DIAGNOSIS — J4530 Mild persistent asthma, uncomplicated: Secondary | ICD-10-CM

## 2019-08-30 DIAGNOSIS — M8589 Other specified disorders of bone density and structure, multiple sites: Secondary | ICD-10-CM

## 2019-08-30 DIAGNOSIS — G8929 Other chronic pain: Secondary | ICD-10-CM

## 2019-08-30 DIAGNOSIS — R7303 Prediabetes: Secondary | ICD-10-CM

## 2019-08-30 DIAGNOSIS — Z0000 Encounter for general adult medical examination without abnormal findings: Secondary | ICD-10-CM

## 2019-08-30 DIAGNOSIS — I10 Essential (primary) hypertension: Secondary | ICD-10-CM | POA: Diagnosis not present

## 2019-08-30 DIAGNOSIS — K219 Gastro-esophageal reflux disease without esophagitis: Secondary | ICD-10-CM | POA: Diagnosis not present

## 2019-08-30 DIAGNOSIS — M545 Low back pain, unspecified: Secondary | ICD-10-CM

## 2019-08-30 DIAGNOSIS — E039 Hypothyroidism, unspecified: Secondary | ICD-10-CM

## 2019-08-30 DIAGNOSIS — M4802 Spinal stenosis, cervical region: Secondary | ICD-10-CM

## 2019-08-30 DIAGNOSIS — N3281 Overactive bladder: Secondary | ICD-10-CM

## 2019-08-30 MED ORDER — OMEPRAZOLE 40 MG PO CPDR: 40.0000 mg | DELAYED_RELEASE_CAPSULE | Freq: Every day | ORAL | 1 refills | Status: DC

## 2019-08-30 NOTE — Assessment & Plan Note (Signed)
Chronic pain Overall pain controlled Continue vicodin at current dose F/u in 3 months

## 2019-08-30 NOTE — Assessment & Plan Note (Signed)
Chronic Check a1c Low sugar / carb diet Stressed regular exercise  

## 2019-08-30 NOTE — Assessment & Plan Note (Signed)
Chronic Controlled, stable Continue current dose of medication Oxybutynin 10 mg daily

## 2019-08-30 NOTE — Assessment & Plan Note (Signed)
Chronic back and neck pain Taking medication appropriately Continue current dose of Vicodin F/u in 3 months

## 2019-08-30 NOTE — Assessment & Plan Note (Signed)
Chronic BP well controlled Current regimen effective and well tolerated Continue current medications at current doses cmp  

## 2019-08-30 NOTE — Assessment & Plan Note (Signed)
Chronic Continue calcium and vitamin D Not exercising regularly, but is doing yard work [employer] due-ordered

## 2019-08-30 NOTE — Assessment & Plan Note (Signed)
Chronic lower back pain Taking pain medication appropriately pain is controlled, stable Continue current pain regimen-hydrocodone-acetaminophen 10-325 mg 2 tabs twice daily Follow-up in 3 months

## 2019-08-30 NOTE — Assessment & Plan Note (Signed)
chronic Mild, persistent Controlled Continue current inhalers

## 2019-09-05 ENCOUNTER — Other Ambulatory Visit (INDEPENDENT_AMBULATORY_CARE_PROVIDER_SITE_OTHER): Payer: Medicare Other

## 2019-09-05 DIAGNOSIS — M545 Low back pain, unspecified: Secondary | ICD-10-CM

## 2019-09-05 DIAGNOSIS — Z0000 Encounter for general adult medical examination without abnormal findings: Secondary | ICD-10-CM | POA: Diagnosis not present

## 2019-09-05 DIAGNOSIS — R7303 Prediabetes: Secondary | ICD-10-CM

## 2019-09-05 DIAGNOSIS — M8589 Other specified disorders of bone density and structure, multiple sites: Secondary | ICD-10-CM

## 2019-09-05 DIAGNOSIS — E039 Hypothyroidism, unspecified: Secondary | ICD-10-CM

## 2019-09-05 DIAGNOSIS — K219 Gastro-esophageal reflux disease without esophagitis: Secondary | ICD-10-CM

## 2019-09-05 DIAGNOSIS — N3281 Overactive bladder: Secondary | ICD-10-CM

## 2019-09-05 DIAGNOSIS — G8929 Other chronic pain: Secondary | ICD-10-CM

## 2019-09-05 DIAGNOSIS — I10 Essential (primary) hypertension: Secondary | ICD-10-CM

## 2019-09-05 DIAGNOSIS — J4530 Mild persistent asthma, uncomplicated: Secondary | ICD-10-CM

## 2019-09-05 DIAGNOSIS — M4802 Spinal stenosis, cervical region: Secondary | ICD-10-CM

## 2019-09-05 LAB — CBC WITH DIFFERENTIAL/PLATELET
Basophils Absolute: 0.1 10*3/uL (ref 0.0–0.1)
Basophils Relative: 0.8 % (ref 0.0–3.0)
Eosinophils Absolute: 0.2 10*3/uL (ref 0.0–0.7)
Eosinophils Relative: 2.5 % (ref 0.0–5.0)
HCT: 34.7 % — ABNORMAL LOW (ref 36.0–46.0)
Hemoglobin: 11.5 g/dL — ABNORMAL LOW (ref 12.0–15.0)
Lymphocytes Relative: 44.8 % (ref 12.0–46.0)
Lymphs Abs: 3.1 10*3/uL (ref 0.7–4.0)
MCHC: 33.1 g/dL (ref 30.0–36.0)
MCV: 93.8 fl (ref 78.0–100.0)
Monocytes Absolute: 0.6 10*3/uL (ref 0.1–1.0)
Monocytes Relative: 9.4 % (ref 3.0–12.0)
Neutro Abs: 2.9 10*3/uL (ref 1.4–7.7)
Neutrophils Relative %: 42.5 % — ABNORMAL LOW (ref 43.0–77.0)
Platelets: 180 10*3/uL (ref 150.0–400.0)
RBC: 3.7 Mil/uL — ABNORMAL LOW (ref 3.87–5.11)
RDW: 13.7 % (ref 11.5–15.5)
WBC: 6.8 10*3/uL (ref 4.0–10.5)

## 2019-09-05 LAB — LIPID PANEL
Cholesterol: 191 mg/dL (ref 0–200)
HDL: 71.3 mg/dL (ref >39.00)
LDL Cholesterol: 104 mg/dL — ABNORMAL HIGH (ref 0–99)
NonHDL: 119.23
Total CHOL/HDL Ratio: 3
Triglycerides: 77 mg/dL (ref 0.0–149.0)
VLDL: 15.4 mg/dL (ref 0.0–40.0)

## 2019-09-05 LAB — COMPREHENSIVE METABOLIC PANEL
ALT: 13 U/L (ref 0–35)
AST: 18 U/L (ref 0–37)
Albumin: 4.5 g/dL (ref 3.5–5.2)
Alkaline Phosphatase: 58 U/L (ref 39–117)
BUN: 31 mg/dL — ABNORMAL HIGH (ref 6–23)
CO2: 25 mEq/L (ref 19–32)
Calcium: 9.7 mg/dL (ref 8.4–10.5)
Chloride: 103 mEq/L (ref 96–112)
Creatinine, Ser: 0.93 mg/dL (ref 0.40–1.20)
GFR: 58.35 mL/min — ABNORMAL LOW (ref >60.00)
Glucose, Bld: 90 mg/dL (ref 70–99)
Potassium: 4 mEq/L (ref 3.5–5.1)
Sodium: 139 mEq/L (ref 135–145)
Total Bilirubin: 0.5 mg/dL (ref 0.2–1.2)
Total Protein: 7 g/dL (ref 6.0–8.3)

## 2019-09-05 LAB — HEMOGLOBIN A1C: Hgb A1c MFr Bld: 6.2 % (ref 4.6–6.5)

## 2019-09-05 LAB — TSH: TSH: 4.22 u[IU]/mL (ref 0.35–4.50)

## 2019-09-21 ENCOUNTER — Other Ambulatory Visit: Payer: Self-pay | Admitting: Internal Medicine

## 2019-09-21 ENCOUNTER — Telehealth: Payer: Self-pay | Admitting: Internal Medicine

## 2019-09-21 NOTE — Telephone Encounter (Signed)
   Refill HYDROcodone-acetaminophen (NORCO) 10-325 MG tablet Pharmacy: [facility], [place] - [address]

## 2019-09-22 ENCOUNTER — Other Ambulatory Visit: Payer: Self-pay | Admitting: Internal Medicine

## 2019-09-22 DIAGNOSIS — G8929 Other chronic pain: Secondary | ICD-10-CM

## 2019-09-22 DIAGNOSIS — M5440 Lumbago with sciatica, unspecified side: Secondary | ICD-10-CM

## 2019-09-22 DIAGNOSIS — M4802 Spinal stenosis, cervical region: Secondary | ICD-10-CM

## 2019-09-22 MED ORDER — HYDROCODONE-ACETAMINOPHEN 10-325 MG PO TABS: 2.0000 | ORAL_TABLET | Freq: Two times a day (BID) | ORAL | 0 refills | Status: DC

## 2019-10-05 DIAGNOSIS — L820 Inflamed seborrheic keratosis: Secondary | ICD-10-CM | POA: Diagnosis not present

## 2019-10-05 DIAGNOSIS — D2272 Melanocytic nevi of left lower limb, including hip: Secondary | ICD-10-CM | POA: Diagnosis not present

## 2019-10-05 DIAGNOSIS — L814 Other melanin hyperpigmentation: Secondary | ICD-10-CM | POA: Diagnosis not present

## 2019-10-05 DIAGNOSIS — L570 Actinic keratosis: Secondary | ICD-10-CM | POA: Diagnosis not present

## 2019-10-20 ENCOUNTER — Other Ambulatory Visit: Payer: Self-pay | Admitting: Internal Medicine

## 2019-10-20 NOTE — Telephone Encounter (Signed)
Please refill as per office routine med refill policy (all routine meds refilled for 3 mo or monthly per pt preference up to one year from last visit, then month to month grace period for 3 mo, then further med refills will have to be denied)  

## 2019-10-27 ENCOUNTER — Telehealth: Payer: Self-pay | Admitting: Internal Medicine

## 2019-10-27 DIAGNOSIS — M4802 Spinal stenosis, cervical region: Secondary | ICD-10-CM

## 2019-10-27 DIAGNOSIS — M5440 Lumbago with sciatica, unspecified side: Secondary | ICD-10-CM

## 2019-10-27 DIAGNOSIS — G8929 Other chronic pain: Secondary | ICD-10-CM

## 2019-10-27 MED ORDER — HYDROCODONE-ACETAMINOPHEN 10-325 MG PO TABS: 2.0000 | ORAL_TABLET | Freq: Two times a day (BID) | ORAL | 0 refills | Status: DC

## 2019-10-27 NOTE — Telephone Encounter (Signed)
New message:   1.Medication Requested: HYDROcodone-acetaminophen (NORCO) 10-325 MG tablet 2. Pharmacy (Name, Street, [place]): [facility] [place] 3. On Med List: Yes  4. Last Visit with PCP: 08/30/19  5. Next visit date with PCP: None  Agent: Please be advised that RX refills may take up to 3 business days. We ask that you follow-up with your pharmacy.

## 2019-11-27 ENCOUNTER — Telehealth: Payer: Self-pay | Admitting: Internal Medicine

## 2019-11-27 DIAGNOSIS — M5440 Lumbago with sciatica, unspecified side: Secondary | ICD-10-CM

## 2019-11-27 DIAGNOSIS — G8929 Other chronic pain: Secondary | ICD-10-CM

## 2019-11-27 DIAGNOSIS — M4802 Spinal stenosis, cervical region: Secondary | ICD-10-CM

## 2019-11-27 MED ORDER — HYDROCODONE-ACETAMINOPHEN 10-325 MG PO TABS: 2.0000 | ORAL_TABLET | Freq: Two times a day (BID) | ORAL | 0 refills | Status: DC

## 2019-11-27 NOTE — Telephone Encounter (Signed)
Patient is requesting a refill on the following medication.  HYDROcodone-acetaminophen ([place]) 10-325 MG tablet   [id number] [place], [place] - [address]  [place], [place] [address]  Phone:  [phone] Fax:  [phone]

## 2019-11-27 NOTE — Telephone Encounter (Signed)
Check [place] registry last filled 10/27/2019.Marland KitchenJohny Chess

## 2019-11-29 ENCOUNTER — Ambulatory Visit
Admission: RE | Admit: 2019-11-29 | Discharge: 2019-11-29 | Disposition: A | Discharge status: Home / Self Care | Payer: Medicare Other | Source: Ambulatory Visit | Attending: Internal Medicine | Admitting: Internal Medicine

## 2019-11-29 ENCOUNTER — Other Ambulatory Visit: Payer: Self-pay

## 2019-11-29 DIAGNOSIS — Z780 Asymptomatic menopausal state: Secondary | ICD-10-CM | POA: Diagnosis not present

## 2019-11-29 DIAGNOSIS — M8589 Other specified disorders of bone density and structure, multiple sites: Secondary | ICD-10-CM | POA: Diagnosis not present

## 2019-12-03 NOTE — Patient Instructions (Addendum)
Medications reviewed and updated.  Changes include :   Consider fosamax - it would be once weekly.  Your prescription(s) have been submitted to your pharmacy. Please take as directed and contact our office if you believe you are having problem(s) with the medication(s).    Please followup in 3 months

## 2019-12-03 NOTE — Progress Notes (Signed)
Subjective:    Patient ID: Tonya [name], female    DOB: 01/17/1941, 79 y.o.   MRN: [id number]  HPI The patient is here for follow up for chronic pain management.  Indication for chronic opioid: chronic low back pain Medication and dose: norco 10-325 mg 1-2 tabs BID # pills per month: 120 Last UDS date: 05/09/2019  Pain contract signed (Y/N): 05/09/2019 Date narcotic database last reviewed 12/04/2019 - filled 9/21  Pain assessment:  Pain intensity: high after certain activities (yard work), ok the rest of the time  Amount of pain relief with medication: good Use of pain medications: appropriately taking medications Side effects:  no Sleep:  Ok- dog usually wakes her Mood:  Anxiety, depression controlled Functional activities: continues to be active:  yes  Last took prescribed pain medication:  This morning Any concerning Alcohol, Street Drug use: no   She had her bone density scan done last week and wanted to discuss the results.  Medications and allergies reviewed with patient and updated if appropriate.  Patient Active Problem List   Diagnosis Date Noted  . Urinary frequency 10/18/2018  . Thrombocytopenia ([place]) 10/04/2018  . Nausea vomiting and diarrhea   . Arthralgia 08/24/2018  . Hypertension 08/23/2018  . DOE (dyspnea on exertion) 01/07/2018  . BRBPR (bright red blood per rectum) 01/07/2018  . Acute pain of right shoulder 01/07/2018  . Pain management contract signed, 01/07/18 01/07/2018  . Osteoarthritis of hand 07/05/2017  . Prediabetes 09/21/2016  . Encounter for chronic pain management 05/27/2016  . Fatigue 01/15/2016  . Chronic lower back pain 01/15/2016  . Osteopenia, high FRAX 01/14/2016  . DDD (degenerative disc disease), cervical S/P Decompressive laminectomy and Fusion 11/15/2012  . Asthma 11/15/2012  . Hypothyroidism 11/15/2012  . Depression 11/15/2012  . Constipation 11/15/2012  . Overactive bladder 11/15/2012  . Anxiety 11/15/2012  .  Cervical spinal stenosis 10/24/2012  . Fracture of cervical vertebra ([place]) 08/31/2012  . Scoliosis 07/05/2012  . Degeneration of intervertebral disc of lumbosacral region 07/04/2012  . GERD (gastroesophageal reflux disease) 06/02/2007    Current Outpatient Medications on File Prior to Visit  Medication Sig Dispense Refill  . amLODipine (NORVASC) 2.5 MG tablet TAKE 1 TABLET BY MOUTH EVERY DAY 90 tablet 0  . budesonide-formoterol (SYMBICORT) 80-4.5 MCG/ACT inhaler TAKE 2 PUFFS BY MOUTH TWICE A DAY 30.6 Inhaler 5  . BYSTOLIC 2.5 MG tablet TAKE 1 TABLET BY MOUTH EVERY DAY 30 tablet 5  . Calcium Citrate 250 MG TABS Take 1 tablet by mouth daily.     . COLLAGEN PO Take by mouth.    . cycloSPORINE (RESTASIS) 0.05 % ophthalmic emulsion Place 1 drop into both eyes 2 (two) times daily.    Marland Kitchen dimenhyDRINATE (DRAMAMINE) 50 MG tablet Take 50 mg by mouth every 8 (eight) hours as needed for nausea.    . diphenhydrAMINE HCl (BENADRYL ALLERGY PO) Take 1 tablet by mouth daily as needed (sleep).     . famotidine (PEPCID) 40 MG tablet TAKE 1 TABLET BY MOUTH EVERY DAY 90 tablet 1  . HYDROcodone-acetaminophen (NORCO) 10-325 MG tablet Take 2 tablets by mouth 2 (two) times daily. For chronic lower back and neck pain 120 tablet 0  . levalbuterol (XOPENEX HFA) 45 MCG/ACT inhaler Inhale 1-2 puffs into the lungs every 4 (four) hours as needed for wheezing. 1 Inhaler 8  . Magnesium 125 MG CAPS Take 125 mg by mouth every evening.     Marland Kitchen omeprazole (PRILOSEC) 40 MG capsule Take  1 capsule (40 mg total) by mouth daily. 90 capsule 1  . oxybutynin (DITROPAN-XL) 10 MG 24 hr tablet TAKE 1 TABLET BY MOUTH EVERYDAY AT BEDTIME 30 tablet 5  . SYNTHROID 50 MCG tablet TAKE 1 TABLET BY MOUTH DAILY BEFORE BREAKFAST 90 tablet 1  . telmisartan (MICARDIS) 40 MG tablet TAKE 1 TABLET BY MOUTH EVERY DAY 90 tablet 1  . vitamin C (ASCORBIC ACID) 500 MG tablet Take 500 mg by mouth daily.    . vitamin E 100 UNIT capsule Take 100 Units by mouth  daily.     No current facility-administered medications on file prior to visit.    Past Medical History:  Diagnosis Date  . Anemia   . Asthma   . Back pain, chronic   . Diverticulosis   . H/O arthrodesis 11/22/2012  . History of hysterectomy   . Hypertension   . Spinal stenosis   . Synovial cyst     Past Surgical History:  Procedure Laterality Date  . ABDOMINAL HYSTERECTOMY    . BACK SURGERY    . CYST REMOVAL TRUNK      Social History   Socioeconomic History  . Marital status: Widowed    Spouse name: Not on file  . Number of children: Not on file  . Years of education: Not on file  . Highest education level: Not on file  Occupational History  . Not on file  Tobacco Use  . Smoking status: Never Smoker  . Smokeless tobacco: Never Used  Substance and Sexual Activity  . Alcohol use: Yes    Comment: socially  . Drug use: No  . Sexual activity: Not on file  Other Topics Concern  . Not on file  Social History Narrative   Not currently exercising   Social Determinants of Health   Financial Resource Strain:   . Difficulty of Paying Living Expenses: Not on file  Food Insecurity:   . Worried About Charity fundraiser in the Last Year: Not on file  . Ran Out of Food in the Last Year: Not on file  Transportation Needs:   . Lack of Transportation (Medical): Not on file  . Lack of Transportation (Non-Medical): Not on file  Physical Activity:   . Days of Exercise per Week: Not on file  . Minutes of Exercise per Session: Not on file  Stress:   . Feeling of Stress : Not on file  Social Connections:   . Frequency of Communication with Friends and Family: Not on file  . Frequency of Social Gatherings with Friends and Family: Not on file  . Attends Religious Services: Not on file  . Active Member of Clubs or Organizations: Not on file  . Attends Archivist Meetings: Not on file  . Marital Status: Not on file    Family History  Problem Relation Age of Onset   . Other Father        carotid artery disease  . Heart disease Brother   . Alcohol abuse Brother   . Breast cancer Neg Hx     Review of Systems     Objective:   Vitals:   12/04/19 0951  BP: (!) 144/80  Pulse: 75  Temp: 98.1 F (36.7 C)  SpO2: 97%   BP Readings from Last 3 Encounters:  12/04/19 (!) 144/80  08/30/19 128/70  05/09/19 126/64   Wt Readings from Last 3 Encounters:  12/04/19 106 lb 9.6 oz (48.4 kg)  08/30/19 105 lb 3.2 oz (  47.7 kg)  05/09/19 103 lb (46.7 kg)   Body mass index is 23.07 kg/m.   Physical Exam    Constitutional: Appears well-developed and well-nourished. No distress.  Skin: Skin is warm and dry. Not diaphoretic.  Psychiatric: Normal mood and affect. Behavior is normal.      Assessment & Plan:    See Problem List for Assessment and Plan of chronic medical problems.    This visit occurred during the SARS-CoV-2 public health emergency.  Safety protocols were in place, including screening questions prior to the visit, additional usage of staff PPE, and extensive cleaning of exam room while observing appropriate contact time as indicated for disinfecting solutions.

## 2019-12-04 ENCOUNTER — Other Ambulatory Visit: Payer: Self-pay

## 2019-12-04 ENCOUNTER — Encounter: Payer: Self-pay | Admitting: Internal Medicine

## 2019-12-04 ENCOUNTER — Ambulatory Visit (INDEPENDENT_AMBULATORY_CARE_PROVIDER_SITE_OTHER): Payer: Medicare Other | Admitting: Internal Medicine

## 2019-12-04 VITALS — BP 144/80 | HR 75 | Temp 98.1°F | Wt 106.6 lb

## 2019-12-04 DIAGNOSIS — G8929 Other chronic pain: Secondary | ICD-10-CM

## 2019-12-04 DIAGNOSIS — M545 Low back pain, unspecified: Secondary | ICD-10-CM

## 2019-12-04 DIAGNOSIS — M8589 Other specified disorders of bone density and structure, multiple sites: Secondary | ICD-10-CM

## 2019-12-04 MED ORDER — DULOXETINE HCL 30 MG PO CPEP: 30.0000 mg | ORAL_CAPSULE | Freq: Every day | ORAL | 1 refills | Status: DC

## 2019-12-04 NOTE — Assessment & Plan Note (Signed)
Chronic Reviewed bone density She is osteopenia with high FRAX and advised that she consider medication. She has been on Boniva in the past, but I would recommend Fosamax once weekly Stressed the importance of regular exercise, especially walking Continue calcium and vitamin D daily She will consider Fosamax and let me know

## 2019-12-04 NOTE — Assessment & Plan Note (Signed)
Chronic lower back pain She is taking her medication appropriately and the medication is helping with her back pain No side effects [place] controlled substance database checked-not due for refill today We will continue current dose of Norco 10-325 mg 1-2 tabs twice daily as needed.  She typically takes 3-4 tabs a day. Pain contract and UDS up-to-date Follow-up in 3 months

## 2019-12-04 NOTE — Assessment & Plan Note (Signed)
Chronic low back pain On chronic pain management with Norco-here for follow-up today Pain adequately controlled and she is taking her medication appropriately No side effects We will continue current dose of hydrocodone-acetaminophen 10-325 mg 1-2 tabs twice daily Follow-up in 3 months

## 2019-12-15 ENCOUNTER — Ambulatory Visit (INDEPENDENT_AMBULATORY_CARE_PROVIDER_SITE_OTHER): Payer: Medicare Other

## 2019-12-15 ENCOUNTER — Other Ambulatory Visit: Payer: Self-pay

## 2019-12-15 DIAGNOSIS — Z0000 Encounter for general adult medical examination without abnormal findings: Secondary | ICD-10-CM | POA: Diagnosis not present

## 2019-12-15 NOTE — Progress Notes (Addendum)
I connected with Tonya [name] today by telephone and verified that I am speaking with the correct person using two identifiers. Location patient: home Location provider: work Persons participating in the virtual visit: Itzell Bendavid and Lisette Abu, LPN.   I discussed the limitations, risks, security and privacy concerns of performing an evaluation and management service by telephone and the availability of in person appointments. I also discussed with the patient that there may be a patient responsible charge related to this service. The patient expressed understanding and verbally consented to this telephonic visit.    Interactive audio and video telecommunications were attempted between this provider and patient, however failed, due to patient having technical difficulties OR patient did not have access to video capability.  We continued and completed visit with audio only.  Some vital signs may be absent or patient reported.   Time Spent with patient on telephone encounter: 25 minutes  Subjective:   Tonya [name] is a 79 y.o. female who presents for Medicare Annual (Subsequent) preventive examination.  Review of Systems    No ROS. Medicare Wellness Visit. Additional risk factors are reflected in social history. Cardiac Risk Factors include: advanced age (>50men, >65 women);family history of premature cardiovascular disease;hypertension     Objective:    There were no vitals filed for this visit. There is no height or weight on file to calculate BMI.  Advanced Directives 12/15/2019 10/03/2018 08/17/2015  Does Patient Have a Medical Advance Directive? Yes Yes Yes  Type of Paramedic of [place];Living will [place];Living will;Out of facility DNR (pink MOST or yellow form) [place];Living will  Does patient want to make changes to medical advance directive? No - Patient declined No - Patient declined -    Copy of [place] in Chart? No - copy requested No - copy requested -    Current Medications (verified) Outpatient Encounter Medications as of 12/15/2019  Medication Sig  . amLODipine (NORVASC) 2.5 MG tablet TAKE 1 TABLET BY MOUTH EVERY DAY  . budesonide-formoterol (SYMBICORT) 80-4.5 MCG/ACT inhaler TAKE 2 PUFFS BY MOUTH TWICE A DAY  . BYSTOLIC 2.5 MG tablet TAKE 1 TABLET BY MOUTH EVERY DAY  . Calcium Citrate 250 MG TABS Take 1 tablet by mouth daily.   . COLLAGEN PO Take by mouth.  . cycloSPORINE (RESTASIS) 0.05 % ophthalmic emulsion Place 1 drop into both eyes 2 (two) times daily.  Marland Kitchen dimenhyDRINATE (DRAMAMINE) 50 MG tablet Take 50 mg by mouth every 8 (eight) hours as needed for nausea.  . diphenhydrAMINE HCl (BENADRYL ALLERGY PO) Take 1 tablet by mouth daily as needed (sleep).   . DULoxetine (CYMBALTA) 30 MG capsule Take 1 capsule (30 mg total) by mouth daily.  . famotidine (PEPCID) 40 MG tablet TAKE 1 TABLET BY MOUTH EVERY DAY  . HYDROcodone-acetaminophen (NORCO) 10-325 MG tablet Take 2 tablets by mouth 2 (two) times daily. For chronic lower back and neck pain  . levalbuterol (XOPENEX HFA) 45 MCG/ACT inhaler Inhale 1-2 puffs into the lungs every 4 (four) hours as needed for wheezing.  . Magnesium 125 MG CAPS Take 125 mg by mouth every evening.   Marland Kitchen omeprazole (PRILOSEC) 40 MG capsule Take 1 capsule (40 mg total) by mouth daily.  Marland Kitchen oxybutynin (DITROPAN-XL) 10 MG 24 hr tablet TAKE 1 TABLET BY MOUTH EVERYDAY AT BEDTIME  . SYNTHROID 50 MCG tablet TAKE 1 TABLET BY MOUTH DAILY BEFORE BREAKFAST  . telmisartan (MICARDIS) 40 MG tablet  TAKE 1 TABLET BY MOUTH EVERY DAY  . vitamin C (ASCORBIC ACID) 500 MG tablet Take 500 mg by mouth daily.  . vitamin E 100 UNIT capsule Take 100 Units by mouth daily.   No facility-administered encounter medications on file as of 12/15/2019.    Allergies (verified) Prochlorperazine edisylate and Other   History: Past Medical History:   Diagnosis Date  . Anemia   . Asthma   . Back pain, chronic   . Diverticulosis   . H/O arthrodesis 11/22/2012  . History of hysterectomy   . Hypertension   . Spinal stenosis   . Synovial cyst    Past Surgical History:  Procedure Laterality Date  . ABDOMINAL HYSTERECTOMY    . BACK SURGERY    . CYST REMOVAL TRUNK     Family History  Problem Relation Age of Onset  . Other Father        carotid artery disease  . Heart disease Brother   . Alcohol abuse Brother   . Breast cancer Neg Hx    Social History   Socioeconomic History  . Marital status: Widowed    Spouse name: Not on file  . Number of children: Not on file  . Years of education: Not on file  . Highest education level: Not on file  Occupational History  . Not on file  Tobacco Use  . Smoking status: Never Smoker  . Smokeless tobacco: Never Used  Substance and Sexual Activity  . Alcohol use: Yes    Comment: socially  . Drug use: No  . Sexual activity: Not on file  Other Topics Concern  . Not on file  Social History Narrative   Not currently exercising   Social Determinants of Health   Financial Resource Strain: Low Risk   . Difficulty of Paying Living Expenses: Not hard at all  Food Insecurity: No Food Insecurity  . Worried About Charity fundraiser in the Last Year: Never true  . Ran Out of Food in the Last Year: Never true  Transportation Needs: No Transportation Needs  . Lack of Transportation (Medical): No  . Lack of Transportation (Non-Medical): No  Physical Activity:   . Days of Exercise per Week: Not on file  . Minutes of Exercise per Session: Not on file  Stress: No Stress Concern Present  . Feeling of Stress : Not at all  Social Connections: Moderately Integrated  . Frequency of Communication with Friends and Family: More than three times a week  . Frequency of Social Gatherings with Friends and Family: More than three times a week  . Attends Religious Services: More than 4 times per year  .  Active Member of Clubs or Organizations: Yes  . Attends Archivist Meetings: More than 4 times per year  . Marital Status: Widowed    Tobacco Counseling Counseling given: Not Answered   Clinical Intake:  Pre-visit preparation completed: Yes  Pain : No/denies pain     Nutritional Risks: None Diabetes: No  How often do you need to have someone help you when you read instructions, pamphlets, or other written materials from your doctor or pharmacy?: 1 - Never What is the last grade level you completed in school?: 1 Year of college  Diabetic? no  Interpreter Needed?: No  Information entered by :: Lisette Abu, LPN   Activities of Daily Living In your present state of health, do you have any difficulty performing the following activities: 12/15/2019  Hearing? Y  Comment loss  hearing in right ear; in process of gettig hearing aids  Vision? N  Difficulty concentrating or making decisions? N  Walking or climbing stairs? N  Dressing or bathing? N  Doing errands, shopping? N  Preparing Food and eating ? N  Using the Toilet? N  In the past six months, have you accidently leaked urine? Y  Comment wears a mini pad  Do you have problems with loss of bowel control? N  Managing your Medications? N  Managing your Finances? N  Housekeeping or managing your Housekeeping? N  Some recent data might be hidden    Patient Care Team: [name], MD as PCP - General (Internal Medicine)  Indicate any recent Medical Services you may have received from other than [facility] providers in the past year (date may be approximate).     Assessment:   This is a routine wellness examination for [place].  Hearing/Vision screen No exam data present  Dietary issues and exercise activities discussed: Current Exercise Habits: Home exercise routine, Type of exercise: walking, Time (Minutes): 30, Frequency (Times/Week): 5, Weekly Exercise (Minutes/Week): 150, Intensity: Mild, Exercise  limited by: respiratory conditions(s);None identified  Goals    . Patient Stated     Get back to walking and being active.      Depression Screen PHQ 2/9 Scores 12/15/2019 03/29/2017 01/14/2016  PHQ - 2 Score 0 0 0  PHQ- 9 Score - 4 -    Fall Risk Fall Risk  12/15/2019 02/08/2019 08/24/2018 03/29/2017 01/14/2016  Falls in the past year? 1 0 1 Yes Yes  Number falls in past yr: 0 0 0 2 or more 2 or more  Injury with Fall? 0 - - - Yes  Risk for fall due to : No Fall Risks - - - History of fall(s);Impaired balance/gait  Follow up Falls evaluation completed - - - -    Any stairs in or around the home? No  If so, are there any without handrails? No  Home free of loose throw rugs in walkways, pet beds, electrical cords, etc? Yes  Adequate lighting in your home to reduce risk of falls? Yes   ASSISTIVE DEVICES UTILIZED TO PREVENT FALLS:  Life alert? No  Use of a cane, walker or w/c? No  Grab bars in the bathroom? No  Shower chair or bench in shower? No  Elevated toilet seat or a handicapped toilet? No   TIMED UP AND GO:  Was the test performed? No .  Length of time to ambulate 10 feet: 0 sec.   Gait steady and fast without use of assistive device  Cognitive Function: Patient is cogitatively intact.        Immunizations Immunization History  Administered Date(s) Administered  . Influenza, High Dose Seasonal PF 12/06/2016, 11/21/2017, 11/30/2018  . Influenza-Unspecified 12/08/2015, 12/07/2016  . PFIZER SARS-COV-2 Vaccination 04/14/2019, 05/05/2019  . Pneumococcal Conjugate-13 06/02/2012  . Pneumococcal Polysaccharide-23 01/26/2013  . Td 03/10/2011  . Zoster Recombinat (Shingrix) 10/08/2016, 05/05/2017    TDAP status: Up to date Flu Vaccine status: Up to date Pneumococcal vaccine status: Up to date Covid-19 vaccine status: Completed vaccines  Qualifies for Shingles Vaccine? Yes   Zostavax completed Yes   Shingrix Completed?: Yes  Screening Tests Health Maintenance   Topic Date Due  . INFLUENZA VACCINE  10/08/2019  . TETANUS/TDAP  03/09/2021  . DEXA SCAN  11/28/2021  . COVID-19 Vaccine  Completed  . Hepatitis C Screening  Completed  . PNA vac Low Risk Adult  Completed  Health Maintenance  Health Maintenance Due  Topic Date Due  . INFLUENZA VACCINE  10/08/2019    Colorectal cancer screening: No longer required.  Mammogram status: Completed 11/10/2017. Repeat every year Bone Density status: Completed 11/29/2019. Results reflect: Bone density results: OSTEOPENIA. Repeat every 2 years.  Lung Cancer Screening: (Low Dose CT Chest recommended if Age 28-80 years, 30 pack-year currently smoking OR have quit w/in 15years.) does not qualify.   Lung Cancer Screening Referral: no  Additional Screening:  Hepatitis C Screening: does qualify; Completed yes  Vision Screening: Recommended annual ophthalmology exams for early detection of glaucoma and other disorders of the eye. Is the patient up to date with their annual eye exam?  Yes  Who is the provider or what is the name of the office in which the patient attends annual eye exams? Luberta Mutter, MD If pt is not established with a provider, would they like to be referred to a provider to establish care? No .   Dental Screening: Recommended annual dental exams for proper oral hygiene  Community Resource Referral / Chronic Care Management: CRR required this visit?  Yes ([facility])  CCM required this visit?  No      Plan:     I have personally reviewed and noted the following in the patient's chart:   . Medical and social history . Use of alcohol, tobacco or illicit drugs  . Current medications and supplements . Functional ability and status . Nutritional status . Physical activity . Advanced directives . List of other physicians . Hospitalizations, surgeries, and ER visits in previous 12 months . Vitals . Screenings to include cognitive, depression, and falls . Referrals and  appointments  In addition, I have reviewed and discussed with patient certain preventive protocols, quality metrics, and best practice recommendations. A written personalized care plan for preventive services as well as general preventive health recommendations were provided to patient.     Sheral Flow, LPN   60/03/930   Nurse Notes:  Referral for routine mammogram to [facility] placed. Patient is cogitatively intact. There were no vitals filed for this visit. There is no height or weight on file to calculate BMI. Patient stated that she has no issues with gait or balance; does not use any assistive devices.

## 2019-12-15 NOTE — Patient Instructions (Addendum)
Tonya [name] , Thank you for taking time to come for your Medicare Wellness Visit. I appreciate your ongoing commitment to your health goals. Please review the following plan we discussed and let me know if I can assist you in the future.   Screening recommendations/referrals: Colonoscopy: 09/16/2016; no repeat due to age  79: 11/10/2017; referral placed for routine mammogram at [facility]: 11/29/2019 Recommended yearly ophthalmology/optometry visit for glaucoma screening and checkup Recommended yearly dental visit for hygiene and checkup  Vaccinations: Influenza vaccine: 12/2019 [facility]) Pneumococcal vaccine: completed Tdap vaccine: up to date Shingles vaccine: up to date   Covid-19: up to date  Advanced directives: Please bring a copy of your health care power of attorney and living will to the office at your convenience.  Conditions/risks identified: Yes; Reviewed health maintenance screenings with patient today and relevant education, vaccines, and/or referrals were provided. Please continue to do your personal lifestyle choices by: daily care of teeth and gums, regular physical activity (goal should be 5 days a week for 30 minutes), eat a healthy diet, avoid tobacco and drug use, limiting any alcohol intake, taking a low-dose aspirin (if not allergic or have been advised by your provider otherwise) and taking vitamins and minerals as recommended by your provider. Continue doing brain stimulating activities (puzzles, reading, adult coloring books, staying active) to keep memory sharp. Continue to eat heart healthy diet (full of fruits, vegetables, whole grains, lean protein, water--limit salt, fat, and sugar intake) and increase physical activity as tolerated.  Next appointment: Please schedule your next Medicare Wellness Visit with your Nurse Health Advisor in 1 year.   Preventive Care 70 Years and Older, Female Preventive care refers to  lifestyle choices and visits with your health care provider that can promote health and wellness. What does preventive care include?  A yearly physical exam. This is also called an annual well check.  Dental exams once or twice a year.  Routine eye exams. Ask your health care provider how often you should have your eyes checked.  Personal lifestyle choices, including:  Daily care of your teeth and gums.  Regular physical activity.  Eating a healthy diet.  Avoiding tobacco and drug use.  Limiting alcohol use.  Practicing safe sex.  Taking low-dose aspirin every day.  Taking vitamin and mineral supplements as recommended by your health care provider. What happens during an annual well check? The services and screenings done by your health care provider during your annual well check will depend on your age, overall health, lifestyle risk factors, and family history of disease. Counseling  Your health care provider may ask you questions about your:  Alcohol use.  Tobacco use.  Drug use.  Emotional well-being.  Home and relationship well-being.  Sexual activity.  Eating habits.  History of falls.  Memory and ability to understand (cognition).  Work and work Statistician.  Reproductive health. Screening  You may have the following tests or measurements:  Height, weight, and BMI.  Blood pressure.  Lipid and cholesterol levels. These may be checked every 5 years, or more frequently if you are over 96 years old.  Skin check.  Lung cancer screening. You may have this screening every year starting at age 45 if you have a 30-pack-year history of smoking and currently smoke or have quit within the past 15 years.  Fecal occult blood test (FOBT) of the stool. You may have this test every year starting at age 71.  Flexible  sigmoidoscopy or colonoscopy. You may have a sigmoidoscopy every 5 years or a colonoscopy every 10 years starting at age 43.  Hepatitis C blood  test.  Hepatitis B blood test.  Sexually transmitted disease (STD) testing.  Diabetes screening. This is done by checking your blood sugar (glucose) after you have not eaten for a while (fasting). You may have this done every 1-3 years.  Bone density scan. This is done to screen for osteoporosis. You may have this done starting at age 1.  Mammogram. This may be done every 1-2 years. Talk to your health care provider about how often you should have regular mammograms. Talk with your health care provider about your test results, treatment options, and if necessary, the need for more tests. Vaccines  Your health care provider may recommend certain vaccines, such as:  Influenza vaccine. This is recommended every year.  Tetanus, diphtheria, and acellular pertussis (Tdap, Td) vaccine. You may need a Td booster every 10 years.  Zoster vaccine. You may need this after age 60.  Pneumococcal 13-valent conjugate (PCV13) vaccine. One dose is recommended after age 34.  Pneumococcal polysaccharide (PPSV23) vaccine. One dose is recommended after age 51. Talk to your health care provider about which screenings and vaccines you need and how often you need them. This information is not intended to replace advice given to you by your health care provider. Make sure you discuss any questions you have with your health care provider. Document Released: 03/22/2015 Document Revised: 11/13/2015 Document Reviewed: 12/25/2014 Elsevier Interactive Patient Education  2017 [place] Prevention in the Home Falls can cause injuries. They can happen to people of all ages. There are many things you can do to make your home safe and to help prevent falls. What can I do on the outside of my home?  Regularly fix the edges of walkways and driveways and fix any cracks.  Remove anything that might make you trip as you walk through a door, such as a raised step or threshold.  Trim any bushes or trees on the  path to your home.  Use bright outdoor lighting.  Clear any walking paths of anything that might make someone trip, such as rocks or tools.  Regularly check to see if handrails are loose or broken. Make sure that both sides of any steps have handrails.  Any raised decks and porches should have guardrails on the edges.  Have any leaves, snow, or ice cleared regularly.  Use sand or salt on walking paths during winter.  Clean up any spills in your garage right away. This includes oil or grease spills. What can I do in the bathroom?  Use night lights.  Install grab bars by the toilet and in the tub and shower. Do not use towel bars as grab bars.  Use non-skid mats or decals in the tub or shower.  If you need to sit down in the shower, use a plastic, non-slip stool.  Keep the floor dry. Clean up any water that spills on the floor as soon as it happens.  Remove soap buildup in the tub or shower regularly.  Attach bath mats securely with double-sided non-slip rug tape.  Do not have throw rugs and other things on the floor that can make you trip. What can I do in the bedroom?  Use night lights.  Make sure that you have a light by your bed that is easy to reach.  Do not use any sheets or blankets that  are too big for your bed. They should not hang down onto the floor.  Have a firm chair that has side arms. You can use this for support while you get dressed.  Do not have throw rugs and other things on the floor that can make you trip. What can I do in the kitchen?  Clean up any spills right away.  Avoid walking on wet floors.  Keep items that you use a lot in easy-to-reach places.  If you need to reach something above you, use a strong step stool that has a grab bar.  Keep electrical cords out of the way.  Do not use floor polish or wax that makes floors slippery. If you must use wax, use non-skid floor wax.  Do not have throw rugs and other things on the floor that can  make you trip. What can I do with my stairs?  Do not leave any items on the stairs.  Make sure that there are handrails on both sides of the stairs and use them. Fix handrails that are broken or loose. Make sure that handrails are as long as the stairways.  Check any carpeting to make sure that it is firmly attached to the stairs. Fix any carpet that is loose or worn.  Avoid having throw rugs at the top or bottom of the stairs. If you do have throw rugs, attach them to the floor with carpet tape.  Make sure that you have a light switch at the top of the stairs and the bottom of the stairs. If you do not have them, ask someone to add them for you. What else can I do to help prevent falls?  Wear shoes that:  Do not have high heels.  Have rubber bottoms.  Are comfortable and fit you well.  Are closed at the toe. Do not wear sandals.  If you use a stepladder:  Make sure that it is fully opened. Do not climb a closed stepladder.  Make sure that both sides of the stepladder are locked into place.  Ask someone to hold it for you, if possible.  Clearly mark and make sure that you can see:  Any grab bars or handrails.  First and last steps.  Where the edge of each step is.  Use tools that help you move around (mobility aids) if they are needed. These include:  Canes.  Walkers.  Scooters.  Crutches.  Turn on the lights when you go into a dark area. Replace any light bulbs as soon as they burn out.  Set up your furniture so you have a clear path. Avoid moving your furniture around.  If any of your floors are uneven, fix them.  If there are any pets around you, be aware of where they are.  Review your medicines with your doctor. Some medicines can make you feel dizzy. This can increase your chance of falling. Ask your doctor what other things that you can do to help prevent falls. This information is not intended to replace advice given to you by your health care  provider. Make sure you discuss any questions you have with your health care provider. Document Released: 12/20/2008 Document Revised: 08/01/2015 Document Reviewed: 03/30/2014 Elsevier Interactive Patient Education  2017 [employer].

## 2019-12-28 ENCOUNTER — Telehealth: Payer: Self-pay | Admitting: Internal Medicine

## 2019-12-28 DIAGNOSIS — M4802 Spinal stenosis, cervical region: Secondary | ICD-10-CM

## 2019-12-28 DIAGNOSIS — G8929 Other chronic pain: Secondary | ICD-10-CM

## 2019-12-28 MED ORDER — HYDROCODONE-ACETAMINOPHEN 10-325 MG PO TABS: 2.0000 | ORAL_TABLET | Freq: Two times a day (BID) | ORAL | 0 refills | Status: DC

## 2019-12-28 NOTE — Telephone Encounter (Signed)
HYDROcodone-acetaminophen ([place]) 10-325 MG tablet [id number] [place], [place] Phone:  [phone]  Fax:  [phone]     Patient is going out of town next week and will run out of her medication during the trip and was wondering if she could get it refilled before she goes. Next apt- 03/11/2020 Last apt- 12/04/2019

## 2020-01-04 ENCOUNTER — Other Ambulatory Visit: Payer: Self-pay | Admitting: Internal Medicine

## 2020-02-05 ENCOUNTER — Telehealth: Payer: Self-pay | Admitting: Internal Medicine

## 2020-02-05 DIAGNOSIS — M5440 Lumbago with sciatica, unspecified side: Secondary | ICD-10-CM

## 2020-02-05 DIAGNOSIS — M4802 Spinal stenosis, cervical region: Secondary | ICD-10-CM

## 2020-02-05 DIAGNOSIS — G8929 Other chronic pain: Secondary | ICD-10-CM

## 2020-02-05 MED ORDER — HYDROCODONE-ACETAMINOPHEN 10-325 MG PO TABS: 2.0000 | ORAL_TABLET | Freq: Two times a day (BID) | ORAL | 0 refills | Status: DC

## 2020-02-05 NOTE — Telephone Encounter (Signed)
Patient is requesting a refill on the following medication : HYDROcodone-acetaminophen ([place]) 10-325 MG tablet  [facility] [place] [place] Phone:  [phone]  Fax:  [phone]

## 2020-02-16 ENCOUNTER — Other Ambulatory Visit: Payer: Self-pay | Admitting: Internal Medicine

## 2020-02-18 ENCOUNTER — Other Ambulatory Visit: Payer: Self-pay | Admitting: Internal Medicine

## 2020-02-21 ENCOUNTER — Other Ambulatory Visit: Payer: Self-pay | Admitting: Internal Medicine

## 2020-02-26 ENCOUNTER — Telehealth: Payer: Self-pay | Admitting: Internal Medicine

## 2020-02-26 DIAGNOSIS — M4802 Spinal stenosis, cervical region: Secondary | ICD-10-CM

## 2020-02-26 DIAGNOSIS — G8929 Other chronic pain: Secondary | ICD-10-CM

## 2020-02-26 MED ORDER — HYDROCODONE-ACETAMINOPHEN 10-325 MG PO TABS: 2.0000 | ORAL_TABLET | Freq: Two times a day (BID) | ORAL | 0 refills | Status: DC

## 2020-02-26 NOTE — Telephone Encounter (Signed)
    Pharmacy calling to report they were only able to dispense 85 tablets on last order. Please send new order for HYDROcodone-acetaminophen (NORCO) 10-325 MG tablet Patient has no medication remaining

## 2020-03-08 ENCOUNTER — Encounter: Payer: Self-pay | Admitting: Internal Medicine

## 2020-03-08 NOTE — Patient Instructions (Signed)
  Blood work was ordered.   ° ° °Medications changes include :    ° °Your prescription(s) have been submitted to your pharmacy. Please take as directed and contact our office if you believe you are having problem(s) with the medication(s). ° ° °A referral was ordered for        Someone from their office will call you to schedule an appointment.  ° ° °Please followup in 3 months ° °

## 2020-03-08 NOTE — Progress Notes (Signed)
Subjective:    Patient ID: Tonya [name], female    DOB: 18-Feb-1941, 79 y.o.   MRN: [id number]  HPI The patient is here for follow up of their chronic medical problems.  The patient is here for follow up for chronic pain management.  Indication for chronic opioid: chronic lower back pain Medication and dose: norco 10-325 mg  1-2 tabs BID # pills per month: 120 Last UDS date: 05/09/19 Pain contract signed (Y/N): Y, 05/09/19 Date narcotic database last reviewed 03/12/19  Pain assessment:  Pain intensity:  Amount of pain relief with medication: good relief Use of pain medications: appropriately taking medications Side effects:  no Sleep:  ok Mood:  Has anxiety and depression - both controlled Functional activities: continues to be active:  yes  Last took prescribed pain medication:  today Any concerning Alcohol, Street Drug use: no   She also has htn, prediabetes, hypothyroidism, gerd,  asthma, depression and anxiety.  She is taking all of her medications as prescribed.    Medications and allergies reviewed with patient and updated if appropriate.  Patient Active Problem List   Diagnosis Date Noted  . Urinary frequency 10/18/2018  . Thrombocytopenia ([place]) 10/04/2018  . Nausea vomiting and diarrhea   . Arthralgia 08/24/2018  . Hypertension 08/23/2018  . DOE (dyspnea on exertion) 01/07/2018  . BRBPR (bright red blood per rectum) 01/07/2018  . Acute pain of right shoulder 01/07/2018  . Pain management contract signed, 01/07/18 01/07/2018  . Osteoarthritis of hand 07/05/2017  . Prediabetes 09/21/2016  . Encounter for chronic pain management 05/27/2016  . Fatigue 01/15/2016  . Chronic lower back pain 01/15/2016  . Osteopenia, high FRAX 01/14/2016  . DDD (degenerative disc disease), cervical S/P Decompressive laminectomy and Fusion 11/15/2012  . Asthma 11/15/2012  . Hypothyroidism 11/15/2012  . Depression 11/15/2012  . Constipation 11/15/2012  . Overactive bladder  11/15/2012  . Anxiety 11/15/2012  . Cervical spinal stenosis 10/24/2012  . Fracture of cervical vertebra ([place]) 08/31/2012  . Scoliosis 07/05/2012  . Degeneration of intervertebral disc of lumbosacral region 07/04/2012  . GERD (gastroesophageal reflux disease) 06/02/2007    Current Outpatient Medications on File Prior to Visit  Medication Sig Dispense Refill  . amLODipine (NORVASC) 2.5 MG tablet TAKE 1 TABLET BY MOUTH EVERY DAY 90 tablet 0  . budesonide-formoterol (SYMBICORT) 80-4.5 MCG/ACT inhaler TAKE 2 PUFFS BY MOUTH TWICE A DAY 30.6 each 6  . Calcium Citrate 250 MG TABS Take 1 tablet by mouth daily.     . COLLAGEN PO Take by mouth.    . cycloSPORINE (RESTASIS) 0.05 % ophthalmic emulsion Place 1 drop into both eyes 2 (two) times daily.    Marland Kitchen dimenhyDRINATE (DRAMAMINE) 50 MG tablet Take 50 mg by mouth every 8 (eight) hours as needed for nausea.    . diphenhydrAMINE HCl (BENADRYL ALLERGY PO) Take 1 tablet by mouth daily as needed (sleep).     . DULoxetine (CYMBALTA) 30 MG capsule Take 1 capsule (30 mg total) by mouth daily. 90 capsule 1  . famotidine (PEPCID) 40 MG tablet TAKE 1 TABLET BY MOUTH EVERY DAY 90 tablet 1  . HYDROcodone-acetaminophen (NORCO) 10-325 MG tablet Take 2 tablets by mouth 2 (two) times daily. For chronic lower back and neck pain 120 tablet 0  . levalbuterol (XOPENEX HFA) 45 MCG/ACT inhaler Inhale 1-2 puffs into the lungs every 4 (four) hours as needed for wheezing. 1 Inhaler 8  . Magnesium 125 MG CAPS Take 125 mg by mouth every evening.     Marland Kitchen  nebivolol (BYSTOLIC) 2.5 MG tablet TAKE 1 TABLET BY MOUTH EVERY DAY 90 tablet 1  . omeprazole (PRILOSEC) 40 MG capsule Take 1 capsule (40 mg total) by mouth daily. 90 capsule 1  . oxybutynin (DITROPAN-XL) 10 MG 24 hr tablet TAKE 1 TABLET BY MOUTH EVERYDAY AT BEDTIME 30 tablet 5  . SYNTHROID 50 MCG tablet TAKE 1 TABLET BY MOUTH DAILY BEFORE BREAKFAST 30 tablet 5  . telmisartan (MICARDIS) 40 MG tablet TAKE 1 TABLET BY MOUTH EVERY DAY  90 tablet 1  . vitamin C (ASCORBIC ACID) 500 MG tablet Take 500 mg by mouth daily.    . vitamin E 100 UNIT capsule Take 100 Units by mouth daily.     No current facility-administered medications on file prior to visit.    Past Medical History:  Diagnosis Date  . Anemia   . Asthma   . Back pain, chronic   . Diverticulosis   . H/O arthrodesis 11/22/2012  . History of hysterectomy   . Hypertension   . Spinal stenosis   . Synovial cyst     Past Surgical History:  Procedure Laterality Date  . ABDOMINAL HYSTERECTOMY    . BACK SURGERY    . CYST REMOVAL TRUNK      Social History   Socioeconomic History  . Marital status: Widowed    Spouse name: Not on file  . Number of children: Not on file  . Years of education: Not on file  . Highest education level: Not on file  Occupational History  . Not on file  Tobacco Use  . Smoking status: Never Smoker  . Smokeless tobacco: Never Used  Substance and Sexual Activity  . Alcohol use: Yes    Comment: socially  . Drug use: No  . Sexual activity: Not on file  Other Topics Concern  . Not on file  Social History Narrative   Not currently exercising   Social Determinants of Health   Financial Resource Strain: Low Risk   . Difficulty of Paying Living Expenses: Not hard at all  Food Insecurity: No Food Insecurity  . Worried About Programme researcher, broadcasting/film/video in the Last Year: Never true  . Ran Out of Food in the Last Year: Never true  Transportation Needs: No Transportation Needs  . Lack of Transportation (Medical): No  . Lack of Transportation (Non-Medical): No  Physical Activity: Not on file  Stress: No Stress Concern Present  . Feeling of Stress : Not at all  Social Connections: Moderately Integrated  . Frequency of Communication with Friends and Family: More than three times a week  . Frequency of Social Gatherings with Friends and Family: More than three times a week  . Attends Religious Services: More than 4 times per year  .  Active Member of Clubs or Organizations: Yes  . Attends Banker Meetings: More than 4 times per year  . Marital Status: Widowed    Family History  Problem Relation Age of Onset  . Other Father        carotid artery disease  . Heart disease Brother   . Alcohol abuse Brother   . Breast cancer Neg Hx     Review of Systems     Objective:  There were no vitals filed for this visit. BP Readings from Last 3 Encounters:  12/04/19 (!) 144/80  08/30/19 128/70  05/09/19 126/64   Wt Readings from Last 3 Encounters:  12/04/19 106 lb 9.6 oz (48.4 kg)  08/30/19 105 lb  3.2 oz (47.7 kg)  05/09/19 103 lb (46.7 kg)   There is no height or weight on file to calculate BMI.   Physical Exam    Constitutional: Appears well-developed and well-nourished. No distress.  HENT:  Head: Normocephalic and atraumatic.  Neck: Neck supple. No tracheal deviation present. No thyromegaly present.  No cervical lymphadenopathy Cardiovascular: Normal rate, regular rhythm and normal heart sounds.   No murmur heard. No carotid bruit .  No edema Pulmonary/Chest: Effort normal and breath sounds normal. No respiratory distress. No has no wheezes. No rales.  Skin: Skin is warm and dry. Not diaphoretic.  Psychiatric: Normal mood and affect. Behavior is normal.      Assessment & Plan:    See Problem List for Assessment and Plan of chronic medical problems.    This visit occurred during the SARS-CoV-2 public health emergency.  Safety protocols were in place, including screening questions prior to the visit, additional usage of staff PPE, and extensive cleaning of exam room while observing appropriate contact time as indicated for disinfecting solutions.    This encounter was created in error - please disregard.

## 2020-03-11 ENCOUNTER — Encounter: Payer: Medicare Other | Admitting: Internal Medicine

## 2020-03-11 DIAGNOSIS — G8929 Other chronic pain: Secondary | ICD-10-CM

## 2020-03-11 DIAGNOSIS — F3289 Other specified depressive episodes: Secondary | ICD-10-CM

## 2020-03-11 DIAGNOSIS — F419 Anxiety disorder, unspecified: Secondary | ICD-10-CM

## 2020-03-11 DIAGNOSIS — R7303 Prediabetes: Secondary | ICD-10-CM

## 2020-03-11 DIAGNOSIS — J4530 Mild persistent asthma, uncomplicated: Secondary | ICD-10-CM

## 2020-03-11 DIAGNOSIS — K219 Gastro-esophageal reflux disease without esophagitis: Secondary | ICD-10-CM

## 2020-03-11 DIAGNOSIS — E039 Hypothyroidism, unspecified: Secondary | ICD-10-CM

## 2020-03-11 DIAGNOSIS — I10 Essential (primary) hypertension: Secondary | ICD-10-CM

## 2020-03-14 NOTE — Progress Notes (Signed)
Subjective:    Patient ID: Tonya [name], female    DOB: 05/19/1940, 80 y.o.   MRN: [id number]  HPI The patient is here for follow up of their chronic medical problems.  The patient is here for follow up for chronic pain management.  Indication for chronic opioid: chronic lower back pain Medication and dose: norco 10-325 mg  1-2 tabs BID # pills per month: 120 Last UDS date: 05/09/19 Pain contract signed (Y/N): Y, 05/09/19 Date narcotic database last reviewed 03/15/20  Pain assessment:  Pain intensity: mild - moderate depending on activity Amount of pain relief with medication: good relief Use of pain medications: appropriately taking medications Side effects:  no Sleep:  Ok - sleeping too much Mood:  Has anxiety and depression - both controlled Functional activities: continues to be active:  yes  Last took prescribed pain medication:  today Any concerning Alcohol, Street Drug use: no   She also has htn, prediabetes, hypothyroidism, gerd,  asthma, depression and anxiety.  She is taking all of her medications as prescribed.    She is still tired and sleep too long in the morning.     Medications and allergies reviewed with patient and updated if appropriate.  Patient Active Problem List   Diagnosis Date Noted  . Urinary frequency 10/18/2018  . Thrombocytopenia ([place]) 10/04/2018  . Nausea vomiting and diarrhea   . Arthralgia 08/24/2018  . Hypertension 08/23/2018  . DOE (dyspnea on exertion) 01/07/2018  . BRBPR (bright red blood per rectum) 01/07/2018  . Acute pain of right shoulder 01/07/2018  . Pain management contract signed, 01/07/18 01/07/2018  . Osteoarthritis of hand 07/05/2017  . Prediabetes 09/21/2016  . Encounter for chronic pain management 05/27/2016  . Fatigue 01/15/2016  . Chronic lower back pain 01/15/2016  . Osteopenia, high FRAX 01/14/2016  . DDD (degenerative disc disease), cervical S/P Decompressive laminectomy and Fusion 11/15/2012  . Asthma  11/15/2012  . Hypothyroidism 11/15/2012  . Depression 11/15/2012  . Constipation 11/15/2012  . Overactive bladder 11/15/2012  . Anxiety 11/15/2012  . Cervical spinal stenosis 10/24/2012  . Fracture of cervical vertebra ([place]) 08/31/2012  . Scoliosis 07/05/2012  . Degeneration of intervertebral disc of lumbosacral region 07/04/2012  . GERD (gastroesophageal reflux disease) 06/02/2007    Current Outpatient Medications on File Prior to Visit  Medication Sig Dispense Refill  . amLODipine (NORVASC) 2.5 MG tablet TAKE 1 TABLET BY MOUTH EVERY DAY 90 tablet 0  . budesonide-formoterol (SYMBICORT) 80-4.5 MCG/ACT inhaler TAKE 2 PUFFS BY MOUTH TWICE A DAY 30.6 each 6  . Calcium Citrate 250 MG TABS Take 1 tablet by mouth daily.     . COLLAGEN PO Take by mouth.    . cycloSPORINE (RESTASIS) 0.05 % ophthalmic emulsion Place 1 drop into both eyes 2 (two) times daily.    Marland Kitchen dimenhyDRINATE (DRAMAMINE) 50 MG tablet Take 50 mg by mouth every 8 (eight) hours as needed for nausea.    . diphenhydrAMINE HCl (BENADRYL ALLERGY PO) Take 1 tablet by mouth daily as needed (sleep).     . DULoxetine (CYMBALTA) 30 MG capsule Take 1 capsule (30 mg total) by mouth daily. 90 capsule 1  . famotidine (PEPCID) 40 MG tablet TAKE 1 TABLET BY MOUTH EVERY DAY 90 tablet 1  . HYDROcodone-acetaminophen (NORCO) 10-325 MG tablet Take 2 tablets by mouth 2 (two) times daily. For chronic lower back and neck pain 120 tablet 0  . levalbuterol (XOPENEX HFA) 45 MCG/ACT inhaler Inhale 1-2 puffs into the lungs  every 4 (four) hours as needed for wheezing. 1 Inhaler 8  . Magnesium 125 MG CAPS Take 125 mg by mouth every evening.     . nebivolol (BYSTOLIC) 2.5 MG tablet TAKE 1 TABLET BY MOUTH EVERY DAY 90 tablet 1  . omeprazole (PRILOSEC) 40 MG capsule Take 1 capsule (40 mg total) by mouth daily. 90 capsule 1  . oxybutynin (DITROPAN-XL) 10 MG 24 hr tablet TAKE 1 TABLET BY MOUTH EVERYDAY AT BEDTIME 30 tablet 5  . SYNTHROID 50 MCG tablet TAKE 1  TABLET BY MOUTH DAILY BEFORE BREAKFAST 30 tablet 5  . telmisartan (MICARDIS) 40 MG tablet TAKE 1 TABLET BY MOUTH EVERY DAY 90 tablet 1  . vitamin C (ASCORBIC ACID) 500 MG tablet Take 500 mg by mouth daily.    . vitamin E 100 UNIT capsule Take 100 Units by mouth daily.     No current facility-administered medications on file prior to visit.    Past Medical History:  Diagnosis Date  . Anemia   . Asthma   . Back pain, chronic   . Diverticulosis   . H/O arthrodesis 11/22/2012  . History of hysterectomy   . Hypertension   . Spinal stenosis   . Synovial cyst     Past Surgical History:  Procedure Laterality Date  . ABDOMINAL HYSTERECTOMY    . BACK SURGERY    . CYST REMOVAL TRUNK      Social History   Socioeconomic History  . Marital status: Widowed    Spouse name: Not on file  . Number of children: Not on file  . Years of education: Not on file  . Highest education level: Not on file  Occupational History  . Not on file  Tobacco Use  . Smoking status: Never Smoker  . Smokeless tobacco: Never Used  Substance and Sexual Activity  . Alcohol use: Yes    Comment: socially  . Drug use: No  . Sexual activity: Not on file  Other Topics Concern  . Not on file  Social History Narrative   Not currently exercising   Social Determinants of Health   Financial Resource Strain: Low Risk   . Difficulty of Paying Living Expenses: Not hard at all  Food Insecurity: No Food Insecurity  . Worried About Programme researcher, broadcasting/film/video in the Last Year: Never true  . Ran Out of Food in the Last Year: Never true  Transportation Needs: No Transportation Needs  . Lack of Transportation (Medical): No  . Lack of Transportation (Non-Medical): No  Physical Activity: Not on file  Stress: No Stress Concern Present  . Feeling of Stress : Not at all  Social Connections: Moderately Integrated  . Frequency of Communication with Friends and Family: More than three times a week  . Frequency of Social  Gatherings with Friends and Family: More than three times a week  . Attends Religious Services: More than 4 times per year  . Active Member of Clubs or Organizations: Yes  . Attends Banker Meetings: More than 4 times per year  . Marital Status: Widowed    Family History  Problem Relation Age of Onset  . Other Father        carotid artery disease  . Heart disease Brother   . Alcohol abuse Brother   . Breast cancer Neg Hx     Review of Systems  Constitutional: Positive for chills and fatigue. Negative for fever.  Respiratory: Positive for shortness of breath (with strenuous exertion). Negative  for cough and wheezing.   Cardiovascular: Negative for chest pain, palpitations and leg swelling.  Genitourinary: Positive for urgency.       Nocturia x 3  Neurological: Positive for light-headedness (occ) and headaches (occ).       Objective:   Vitals:   03/15/20 1352  BP: 138/72  Pulse: 87  Temp: 98 F (36.7 C)  SpO2: 99%   BP Readings from Last 3 Encounters:  03/15/20 138/72  12/04/19 (!) 144/80  08/30/19 128/70   Wt Readings from Last 3 Encounters:  03/15/20 102 lb (46.3 kg)  12/04/19 106 lb 9.6 oz (48.4 kg)  08/30/19 105 lb 3.2 oz (47.7 kg)   Body mass index is 22.07 kg/m.   Physical Exam    Constitutional: Appears well-developed and well-nourished. No distress.  HENT:  Head: Normocephalic and atraumatic.  Neck: Neck supple. No tracheal deviation present. No thyromegaly present.  No cervical lymphadenopathy Cardiovascular: Normal rate, regular rhythm and normal heart sounds.   No murmur heard. No carotid bruit .  No edema Pulmonary/Chest: Effort normal and breath sounds normal. No respiratory distress. No has no wheezes. No rales.  Skin: Skin is warm and dry. Not diaphoretic.  Psychiatric: Normal mood and affect. Behavior is normal.      Assessment & Plan:    See Problem List for Assessment and Plan of chronic medical problems.    This visit  occurred during the SARS-CoV-2 public health emergency.  Safety protocols were in place, including screening questions prior to the visit, additional usage of staff PPE, and extensive cleaning of exam room while observing appropriate contact time as indicated for disinfecting solutions.

## 2020-03-14 NOTE — Patient Instructions (Signed)
  Blood work was ordered.     Medications changes include :   myrbetriq 25 mg daily for your bladder.  Take the omeprazole only as needed for reflux/heartburn  Your prescription(s) have been submitted to your pharmacy. Please take as directed and contact our office if you believe you are having problem(s) with the medication(s).     Please followup in 3 months

## 2020-03-15 ENCOUNTER — Ambulatory Visit (INDEPENDENT_AMBULATORY_CARE_PROVIDER_SITE_OTHER): Payer: Medicare Other | Admitting: Internal Medicine

## 2020-03-15 ENCOUNTER — Encounter: Payer: Self-pay | Admitting: Internal Medicine

## 2020-03-15 ENCOUNTER — Other Ambulatory Visit: Payer: Self-pay

## 2020-03-15 VITALS — BP 138/72 | HR 87 | Temp 98.0°F | Ht <= 58 in | Wt 102.0 lb

## 2020-03-15 DIAGNOSIS — G8929 Other chronic pain: Secondary | ICD-10-CM | POA: Diagnosis not present

## 2020-03-15 DIAGNOSIS — R8290 Unspecified abnormal findings in urine: Secondary | ICD-10-CM | POA: Diagnosis not present

## 2020-03-15 DIAGNOSIS — K219 Gastro-esophageal reflux disease without esophagitis: Secondary | ICD-10-CM

## 2020-03-15 DIAGNOSIS — I10 Essential (primary) hypertension: Secondary | ICD-10-CM | POA: Diagnosis not present

## 2020-03-15 DIAGNOSIS — J4530 Mild persistent asthma, uncomplicated: Secondary | ICD-10-CM | POA: Diagnosis not present

## 2020-03-15 DIAGNOSIS — E039 Hypothyroidism, unspecified: Secondary | ICD-10-CM

## 2020-03-15 DIAGNOSIS — R7303 Prediabetes: Secondary | ICD-10-CM | POA: Diagnosis not present

## 2020-03-15 DIAGNOSIS — M545 Low back pain, unspecified: Secondary | ICD-10-CM | POA: Diagnosis not present

## 2020-03-15 DIAGNOSIS — F3289 Other specified depressive episodes: Secondary | ICD-10-CM

## 2020-03-15 DIAGNOSIS — F419 Anxiety disorder, unspecified: Secondary | ICD-10-CM

## 2020-03-15 LAB — CBC WITH DIFFERENTIAL/PLATELET
Basophils Absolute: 0.1 10*3/uL (ref 0.0–0.1)
Basophils Relative: 0.8 % (ref 0.0–3.0)
Eosinophils Absolute: 0.1 10*3/uL (ref 0.0–0.7)
Eosinophils Relative: 1.6 % (ref 0.0–5.0)
HCT: 36.7 % (ref 36.0–46.0)
Hemoglobin: 11.9 g/dL — ABNORMAL LOW (ref 12.0–15.0)
Lymphocytes Relative: 16.3 % (ref 12.0–46.0)
Lymphs Abs: 1.5 10*3/uL (ref 0.7–4.0)
MCHC: 32.5 g/dL (ref 30.0–36.0)
MCV: 93.2 fl (ref 78.0–100.0)
Monocytes Absolute: 0.7 10*3/uL (ref 0.1–1.0)
Monocytes Relative: 8.2 % (ref 3.0–12.0)
Neutro Abs: 6.6 10*3/uL (ref 1.4–7.7)
Neutrophils Relative %: 73.1 % (ref 43.0–77.0)
Platelets: 205 10*3/uL (ref 150.0–400.0)
RBC: 3.93 Mil/uL (ref 3.87–5.11)
RDW: 13.9 % (ref 11.5–15.5)
WBC: 9 10*3/uL (ref 4.0–10.5)

## 2020-03-15 LAB — LIPID PANEL
Cholesterol: 208 mg/dL — ABNORMAL HIGH (ref 0–200)
HDL: 75.8 mg/dL (ref >39.00)
LDL Cholesterol: 108 mg/dL — ABNORMAL HIGH (ref 0–99)
NonHDL: 131.88
Total CHOL/HDL Ratio: 3
Triglycerides: 120 mg/dL (ref 0.0–149.0)
VLDL: 24 mg/dL (ref 0.0–40.0)

## 2020-03-15 LAB — URINALYSIS, ROUTINE W REFLEX MICROSCOPIC
Bilirubin Urine: NEGATIVE
Hgb urine dipstick: NEGATIVE
Ketones, ur: NEGATIVE
Leukocytes,Ua: NEGATIVE
Nitrite: NEGATIVE
RBC / HPF: NONE SEEN (ref 0–?)
Specific Gravity, Urine: 1.02 (ref 1.000–1.030)
Total Protein, Urine: NEGATIVE
Urine Glucose: NEGATIVE
Urobilinogen, UA: 0.2 (ref 0.0–1.0)
WBC, UA: NONE SEEN (ref 0–?)
pH: 5.5 (ref 5.0–8.0)

## 2020-03-15 LAB — COMPREHENSIVE METABOLIC PANEL
ALT: 15 U/L (ref 0–35)
AST: 17 U/L (ref 0–37)
Albumin: 4.5 g/dL (ref 3.5–5.2)
Alkaline Phosphatase: 67 U/L (ref 39–117)
BUN: 28 mg/dL — ABNORMAL HIGH (ref 6–23)
CO2: 30 mEq/L (ref 19–32)
Calcium: 9.9 mg/dL (ref 8.4–10.5)
Chloride: 103 mEq/L (ref 96–112)
Creatinine, Ser: 0.9 mg/dL (ref 0.40–1.20)
GFR: 61.36 mL/min (ref >60.00)
Glucose, Bld: 87 mg/dL (ref 70–99)
Potassium: 4.3 mEq/L (ref 3.5–5.1)
Sodium: 138 mEq/L (ref 135–145)
Total Bilirubin: 0.5 mg/dL (ref 0.2–1.2)
Total Protein: 7.2 g/dL (ref 6.0–8.3)

## 2020-03-15 LAB — HEMOGLOBIN A1C: Hgb A1c MFr Bld: 6.1 % (ref 4.6–6.5)

## 2020-03-15 LAB — MICROALBUMIN / CREATININE URINE RATIO
Creatinine,U: 129.3 mg/dL
Microalb Creat Ratio: 0.9 mg/g (ref 0.0–30.0)
Microalb, Ur: 1.2 mg/dL (ref 0.0–1.9)

## 2020-03-15 LAB — TSH: TSH: 2.3 u[IU]/mL (ref 0.35–4.50)

## 2020-03-15 MED ORDER — OMEPRAZOLE 20 MG PO CPDR: 20.0000 mg | DELAYED_RELEASE_CAPSULE | Freq: Every day | ORAL | 5 refills | Status: DC | PRN

## 2020-03-15 MED ORDER — MIRABEGRON ER 25 MG PO TB24: 25.0000 mg | ORAL_TABLET | Freq: Every day | ORAL | 5 refills | Status: DC

## 2020-03-15 NOTE — Assessment & Plan Note (Signed)
Chronic GERD controlled Continue pepcid 40 mg daily, omeprazole 40 mg daily

## 2020-03-15 NOTE — Assessment & Plan Note (Signed)
Chronic Has some depressed motivation and sleeping more than she should, increased fatigue She does nt ant to increase the cymbalta - she is not really sure if the above is depression or laziness Continue cymbalta 30 mg daily

## 2020-03-15 NOTE — Assessment & Plan Note (Signed)
Chronic  Clinically euthyroid Currently taking synthroid 50 mcg daily Check tsh  Titrate med dose if needed

## 2020-03-15 NOTE — Assessment & Plan Note (Signed)
Chronic Controlled, stable Continue Cymbalta 30 mg daily

## 2020-03-15 NOTE — Assessment & Plan Note (Signed)
Chronic Controlled Mild, persistent xopenex every 4 hours as needed

## 2020-03-15 NOTE — Assessment & Plan Note (Signed)
Chronic On chronic pain management with Norco 10-325 mg 2 tabs twice daily She is taking her medication appropriately and denies side effects.  The medication is effective and overall her pain is controlled We will continue current dose of medication as above Follow-up in 3 months

## 2020-03-15 NOTE — Assessment & Plan Note (Signed)
Chronic °BP well controlled °Continue amlodipine 2.5 mg daily, bystolic 2.5 mg daily, telmisartan 40 mg daily °cmp ° °

## 2020-03-15 NOTE — Assessment & Plan Note (Signed)
Chronic lower back pain [place] controlled substance database checked and confirmed she is taking the medication appropriately Medication is effective for her to be at home Continue Norco 10-325 mg 2 tabs twice daily as needed Follow-up in 3 months

## 2020-03-15 NOTE — Assessment & Plan Note (Signed)
Chronic Check a1c Low sugar / carb diet Stressed regular exercise  

## 2020-03-20 DIAGNOSIS — H5211 Myopia, right eye: Secondary | ICD-10-CM | POA: Diagnosis not present

## 2020-03-20 DIAGNOSIS — H4320 Crystalline deposits in vitreous body, unspecified eye: Secondary | ICD-10-CM | POA: Diagnosis not present

## 2020-03-20 DIAGNOSIS — Z961 Presence of intraocular lens: Secondary | ICD-10-CM | POA: Diagnosis not present

## 2020-03-29 ENCOUNTER — Telehealth: Payer: Self-pay | Admitting: Internal Medicine

## 2020-03-29 DIAGNOSIS — G8929 Other chronic pain: Secondary | ICD-10-CM

## 2020-03-29 DIAGNOSIS — M4802 Spinal stenosis, cervical region: Secondary | ICD-10-CM

## 2020-03-29 DIAGNOSIS — M5440 Lumbago with sciatica, unspecified side: Secondary | ICD-10-CM

## 2020-03-29 MED ORDER — HYDROCODONE-ACETAMINOPHEN 10-325 MG PO TABS: 2.0000 | ORAL_TABLET | Freq: Two times a day (BID) | ORAL | 0 refills | Status: DC

## 2020-03-29 NOTE — Telephone Encounter (Signed)
HYDROcodone-acetaminophen ([place]) 10-325 MG tablet [facility] [id number], [place] Phone:  [phone]  Fax:  [phone]     Requesting a refill  Last seen- 01.07.22 Next apt- 04.08.22

## 2020-04-15 ENCOUNTER — Other Ambulatory Visit: Payer: Self-pay | Admitting: Internal Medicine

## 2020-05-01 ENCOUNTER — Telehealth: Payer: Self-pay | Admitting: Internal Medicine

## 2020-05-01 DIAGNOSIS — M4802 Spinal stenosis, cervical region: Secondary | ICD-10-CM

## 2020-05-01 DIAGNOSIS — G8929 Other chronic pain: Secondary | ICD-10-CM

## 2020-05-01 DIAGNOSIS — M5440 Lumbago with sciatica, unspecified side: Secondary | ICD-10-CM

## 2020-05-01 MED ORDER — HYDROCODONE-ACETAMINOPHEN 10-325 MG PO TABS: 2.0000 | ORAL_TABLET | Freq: Two times a day (BID) | ORAL | 0 refills | Status: DC

## 2020-05-01 NOTE — Telephone Encounter (Signed)
1.Medication Requested: HYDROcodone-acetaminophen (NORCO) 10-325 MG tablet    2. Pharmacy (Name, [address], [place]): [facility] [place] [place]  3. On Med List: yes   4. Last Visit with PCP: 1.7.22  5. Next visit date with PCP: 4.8.22   Agent: Please be advised that RX refills may take up to 3 business days. We ask that you follow-up with your pharmacy.

## 2020-05-06 ENCOUNTER — Other Ambulatory Visit: Payer: Self-pay | Admitting: Internal Medicine

## 2020-05-07 ENCOUNTER — Other Ambulatory Visit: Payer: Self-pay | Admitting: Internal Medicine

## 2020-05-28 ENCOUNTER — Telehealth: Payer: Self-pay | Admitting: Internal Medicine

## 2020-05-28 DIAGNOSIS — M5440 Lumbago with sciatica, unspecified side: Secondary | ICD-10-CM

## 2020-05-28 DIAGNOSIS — M4802 Spinal stenosis, cervical region: Secondary | ICD-10-CM

## 2020-05-28 DIAGNOSIS — G8929 Other chronic pain: Secondary | ICD-10-CM

## 2020-05-28 MED ORDER — HYDROCODONE-ACETAMINOPHEN 10-325 MG PO TABS: 2.0000 | ORAL_TABLET | Freq: Two times a day (BID) | ORAL | 0 refills | Status: DC

## 2020-05-28 NOTE — Telephone Encounter (Signed)
1.Medication Requested: HYDROcodone-acetaminophen (NORCO) 10-325 MG tablet    2. Pharmacy (Name, Street, [place]): [facility] [place] [place] [address]  3. On Med List: yes   4. Last Visit with PCP: 1.7.22  5. Next visit date with PCP: 4.8.22   Agent: Please be advised that RX refills may take up to 3 business days. We ask that you follow-up with your pharmacy.

## 2020-05-31 ENCOUNTER — Other Ambulatory Visit: Payer: Self-pay | Admitting: Internal Medicine

## 2020-06-13 ENCOUNTER — Encounter: Payer: Self-pay | Admitting: Internal Medicine

## 2020-06-13 NOTE — Patient Instructions (Addendum)
° ° ° °  Medications changes include :  none  ° ° ° ° °Please followup in 3 months ° °

## 2020-06-13 NOTE — Progress Notes (Signed)
Subjective:    Patient ID: Tonya [name], female    DOB: 1940/07/29, 80 y.o.   MRN: [id number]  HPI The patient is here for follow up of their chronic medical problems, including pain management, depression, anxiety  She does not go many places and does not care if she goes anywhere.  Indication for chronic opioid: chronic lower back pain Medication and dose: norco 10-325 mg  1-2 tabs BID # pills per month: 120 Last UDS date: 05/2019 Pain contract signed (Y/N): y Date narcotic database last reviewed 06/14/20   Pain assessment:  Increased back pain recently due to bending over more Pain intensity: 5/10  Amount of pain relief with medication: good Use of pain medications: appropriately taking medications Side effects:  no Sleep:  Good if the dog was not there and she did not have to urinate Mood:  Controlled - it concerns she she is not getting out enough Functional activities: continues to be active:  yes   Last took prescribed pain medication:  This morning Any concerning Alcohol, Street Drug use: no     Medications and allergies reviewed with patient and updated if appropriate.  Patient Active Problem List   Diagnosis Date Noted  . Urinary frequency 10/18/2018  . Thrombocytopenia ([place]) 10/04/2018  . Nausea vomiting and diarrhea   . Arthralgia 08/24/2018  . Hypertension 08/23/2018  . DOE (dyspnea on exertion) 01/07/2018  . BRBPR (bright red blood per rectum) 01/07/2018  . Acute pain of right shoulder 01/07/2018  . Pain management contract signed, 01/07/18 01/07/2018  . Osteoarthritis of hand 07/05/2017  . Prediabetes 09/21/2016  . Encounter for chronic pain management 05/27/2016  . Fatigue 01/15/2016  . Chronic lower back pain 01/15/2016  . Osteopenia, high FRAX 01/14/2016  . DDD (degenerative disc disease), cervical S/P Decompressive laminectomy and Fusion 11/15/2012  . Asthma 11/15/2012  . Hypothyroidism 11/15/2012  . Depression 11/15/2012  .  Constipation 11/15/2012  . Overactive bladder 11/15/2012  . Anxiety 11/15/2012  . Cervical spinal stenosis 10/24/2012  . Fracture of cervical vertebra ([place]) 08/31/2012  . Scoliosis 07/05/2012  . Degeneration of intervertebral disc of lumbosacral region 07/04/2012  . GERD (gastroesophageal reflux disease) 06/02/2007    Current Outpatient Medications on File Prior to Visit  Medication Sig Dispense Refill  . amLODipine (NORVASC) 2.5 MG tablet TAKE 1 TABLET BY MOUTH EVERY DAY 90 tablet 0  . budesonide-formoterol (SYMBICORT) 80-4.5 MCG/ACT inhaler TAKE 2 PUFFS BY MOUTH TWICE A DAY 30.6 each 6  . Calcium Citrate 250 MG TABS Take 1 tablet by mouth daily.     . COLLAGEN PO Take by mouth.    . cycloSPORINE (RESTASIS) 0.05 % ophthalmic emulsion Place 1 drop into both eyes 2 (two) times daily.    Marland Kitchen dimenhyDRINATE (DRAMAMINE) 50 MG tablet Take 50 mg by mouth every 8 (eight) hours as needed for nausea.    . diphenhydrAMINE HCl (BENADRYL ALLERGY PO) Take 1 tablet by mouth daily as needed (sleep).     . DULoxetine (CYMBALTA) 30 MG capsule TAKE 1 CAPSULE BY MOUTH EVERY DAY 90 capsule 1  . famotidine (PEPCID) 40 MG tablet TAKE 1 TABLET BY MOUTH EVERY DAY 90 tablet 1  . HYDROcodone-acetaminophen (NORCO) 10-325 MG tablet Take 2 tablets by mouth 2 (two) times daily. For chronic lower back and neck pain 120 tablet 0  . levalbuterol (XOPENEX HFA) 45 MCG/ACT inhaler Inhale 1-2 puffs into the lungs every 4 (four) hours as needed for wheezing. 1 Inhaler 8  .  Magnesium 125 MG CAPS Take 125 mg by mouth every evening.     . mirabegron ER (MYRBETRIQ) 25 MG TB24 tablet Take 1 tablet (25 mg total) by mouth daily. 30 tablet 5  . nebivolol (BYSTOLIC) 2.5 MG tablet TAKE 1 TABLET BY MOUTH EVERY DAY 90 tablet 1  . omeprazole (PRILOSEC) 20 MG capsule Take 1 capsule (20 mg total) by mouth daily as needed. 30 capsule 5  . oxybutynin (DITROPAN-XL) 10 MG 24 hr tablet TAKE 1 TABLET BY MOUTH EVERYDAY AT BEDTIME 90 tablet 1  .  SYNTHROID 50 MCG tablet TAKE 1 TABLET BY MOUTH DAILY BEFORE BREAKFAST 90 tablet 1  . telmisartan (MICARDIS) 40 MG tablet TAKE 1 TABLET BY MOUTH EVERY DAY 90 tablet 1  . vitamin C (ASCORBIC ACID) 500 MG tablet Take 500 mg by mouth daily.    . vitamin E 100 UNIT capsule Take 100 Units by mouth daily.     No current facility-administered medications on file prior to visit.    Past Medical History:  Diagnosis Date  . Anemia   . Asthma   . Back pain, chronic   . Diverticulosis   . H/O arthrodesis 11/22/2012  . History of hysterectomy   . Hypertension   . Spinal stenosis   . Synovial cyst     Past Surgical History:  Procedure Laterality Date  . ABDOMINAL HYSTERECTOMY    . BACK SURGERY    . CYST REMOVAL TRUNK      Social History   Socioeconomic History  . Marital status: Widowed    Spouse name: Not on file  . Number of children: Not on file  . Years of education: Not on file  . Highest education level: Not on file  Occupational History  . Not on file  Tobacco Use  . Smoking status: Never Smoker  . Smokeless tobacco: Never Used  Substance and Sexual Activity  . Alcohol use: Yes    Comment: socially  . Drug use: No  . Sexual activity: Not on file  Other Topics Concern  . Not on file  Social History Narrative   Not currently exercising   Social Determinants of Health   Financial Resource Strain: Low Risk   . Difficulty of Paying Living Expenses: Not hard at all  Food Insecurity: No Food Insecurity  . Worried About Charity fundraiser in the Last Year: Never true  . Ran Out of Food in the Last Year: Never true  Transportation Needs: No Transportation Needs  . Lack of Transportation (Medical): No  . Lack of Transportation (Non-Medical): No  Physical Activity: Not on file  Stress: No Stress Concern Present  . Feeling of Stress : Not at all  Social Connections: Moderately Integrated  . Frequency of Communication with Friends and Family: More than three times a week   . Frequency of Social Gatherings with Friends and Family: More than three times a week  . Attends Religious Services: More than 4 times per year  . Active Member of Clubs or Organizations: Yes  . Attends Archivist Meetings: More than 4 times per year  . Marital Status: Widowed    Family History  Problem Relation Age of Onset  . Other Father        carotid artery disease  . Heart disease Brother   . Alcohol abuse Brother   . Breast cancer Neg Hx     Review of Systems  Constitutional: Negative for fever.  Gastrointestinal: Negative for constipation.  Musculoskeletal: Positive for back pain and neck pain.  Psychiatric/Behavioral: Positive for dysphoric mood. The patient is nervous/anxious.        Objective:   Vitals:   06/14/20 1345  BP: 124/78  Pulse: 80  Temp: 98.1 F (36.7 C)  SpO2: 96%   BP Readings from Last 3 Encounters:  06/14/20 124/78  03/15/20 138/72  12/04/19 (!) 144/80   Wt Readings from Last 3 Encounters:  06/14/20 102 lb (46.3 kg)  03/15/20 102 lb (46.3 kg)  12/04/19 106 lb 9.6 oz (48.4 kg)   Body mass index is 22.07 kg/m.   Physical Exam    Constitutional: Appears well-developed and well-nourished. No distress.  Skin: Skin is warm and dry. Not diaphoretic.  Psychiatric: Normal mood and affect. Behavior is normal.      Assessment & Plan:    See Problem List for Assessment and Plan of chronic medical problems.    This visit occurred during the SARS-CoV-2 public health emergency.  Safety protocols were in place, including screening questions prior to the visit, additional usage of staff PPE, and extensive cleaning of exam room while observing appropriate contact time as indicated for disinfecting solutions.

## 2020-06-14 ENCOUNTER — Other Ambulatory Visit: Payer: Self-pay

## 2020-06-14 ENCOUNTER — Ambulatory Visit (INDEPENDENT_AMBULATORY_CARE_PROVIDER_SITE_OTHER): Payer: Medicare Other | Admitting: Internal Medicine

## 2020-06-14 VITALS — BP 124/78 | HR 80 | Temp 98.1°F | Ht <= 58 in | Wt 102.0 lb

## 2020-06-14 DIAGNOSIS — F3289 Other specified depressive episodes: Secondary | ICD-10-CM | POA: Diagnosis not present

## 2020-06-14 DIAGNOSIS — R7303 Prediabetes: Secondary | ICD-10-CM

## 2020-06-14 DIAGNOSIS — J4530 Mild persistent asthma, uncomplicated: Secondary | ICD-10-CM

## 2020-06-14 DIAGNOSIS — K219 Gastro-esophageal reflux disease without esophagitis: Secondary | ICD-10-CM

## 2020-06-14 DIAGNOSIS — M545 Low back pain, unspecified: Secondary | ICD-10-CM | POA: Diagnosis not present

## 2020-06-14 DIAGNOSIS — G8929 Other chronic pain: Secondary | ICD-10-CM | POA: Diagnosis not present

## 2020-06-14 DIAGNOSIS — E039 Hypothyroidism, unspecified: Secondary | ICD-10-CM

## 2020-06-14 DIAGNOSIS — N3281 Overactive bladder: Secondary | ICD-10-CM

## 2020-06-14 DIAGNOSIS — F419 Anxiety disorder, unspecified: Secondary | ICD-10-CM

## 2020-06-14 DIAGNOSIS — I10 Essential (primary) hypertension: Secondary | ICD-10-CM

## 2020-06-14 NOTE — Assessment & Plan Note (Signed)
Chronic lower back pain Pain medication is helping and she is taking the medication appropriately without side effects [place] controlled substance database checked and she is not needing a refill at this time Continue Norco 10-325 mg 2 tabs twice daily as needed Follow-up in 3 months

## 2020-06-14 NOTE — Assessment & Plan Note (Signed)
Chronic Depression is overall controlled Continue duloxetine 30 mg daily

## 2020-06-14 NOTE — Assessment & Plan Note (Signed)
Chronic Overall pain controlled-has had some slightly increased pain recently secondary to doing more yard work and bending more She is taking her pain medication appropriately, has no side effects and the medication is effective Continue Norco 10-325 mg 2 tablets twice daily as needed Follow-up in 3 months

## 2020-06-14 NOTE — Assessment & Plan Note (Addendum)
Chronic Encouraged her to be as social as possible Anxiety overall controlled Continue duloxetine 30 mg daily

## 2020-07-02 ENCOUNTER — Telehealth: Payer: Self-pay | Admitting: Internal Medicine

## 2020-07-02 DIAGNOSIS — M4802 Spinal stenosis, cervical region: Secondary | ICD-10-CM

## 2020-07-02 DIAGNOSIS — M5440 Lumbago with sciatica, unspecified side: Secondary | ICD-10-CM

## 2020-07-02 DIAGNOSIS — G8929 Other chronic pain: Secondary | ICD-10-CM

## 2020-07-02 MED ORDER — HYDROCODONE-ACETAMINOPHEN 10-325 MG PO TABS: 2.0000 | ORAL_TABLET | Freq: Two times a day (BID) | ORAL | 0 refills | Status: DC

## 2020-07-02 NOTE — Telephone Encounter (Signed)
   Patient requesting refill for HYDROcodone-acetaminophen (NORCO) 10-325 MG tablet   Ph[facility]acy [facility] [place], [place] [address]

## 2020-07-19 ENCOUNTER — Ambulatory Visit: Payer: Medicare Other

## 2020-07-27 ENCOUNTER — Other Ambulatory Visit: Payer: Self-pay

## 2020-07-27 ENCOUNTER — Ambulatory Visit
Admission: RE | Admit: 2020-07-27 | Discharge: 2020-07-27 | Disposition: A | Discharge status: Home / Self Care | Payer: Medicare Other | Source: Ambulatory Visit | Attending: Internal Medicine | Admitting: Internal Medicine

## 2020-07-27 DIAGNOSIS — Z0000 Encounter for general adult medical examination without abnormal findings: Secondary | ICD-10-CM

## 2020-07-27 DIAGNOSIS — Z1231 Encounter for screening mammogram for malignant neoplasm of breast: Secondary | ICD-10-CM | POA: Diagnosis not present

## 2020-08-06 ENCOUNTER — Telehealth: Payer: Self-pay | Admitting: Internal Medicine

## 2020-08-06 DIAGNOSIS — G8929 Other chronic pain: Secondary | ICD-10-CM

## 2020-08-06 DIAGNOSIS — M5440 Lumbago with sciatica, unspecified side: Secondary | ICD-10-CM

## 2020-08-06 DIAGNOSIS — M4802 Spinal stenosis, cervical region: Secondary | ICD-10-CM

## 2020-08-06 NOTE — Telephone Encounter (Signed)
   Patient requesting refill for HYDROcodone-acetaminophen (NORCO) 10-325 MG tablet  Pharmacy [facility] [place], [place] [address]

## 2020-08-07 MED ORDER — HYDROCODONE-ACETAMINOPHEN 10-325 MG PO TABS: 2.0000 | ORAL_TABLET | Freq: Two times a day (BID) | ORAL | 0 refills | Status: DC

## 2020-08-18 ENCOUNTER — Other Ambulatory Visit: Payer: Self-pay | Admitting: Internal Medicine

## 2020-09-06 ENCOUNTER — Other Ambulatory Visit: Payer: Self-pay | Admitting: Internal Medicine

## 2020-09-06 DIAGNOSIS — M5440 Lumbago with sciatica, unspecified side: Secondary | ICD-10-CM

## 2020-09-06 DIAGNOSIS — M4802 Spinal stenosis, cervical region: Secondary | ICD-10-CM

## 2020-09-06 DIAGNOSIS — G8929 Other chronic pain: Secondary | ICD-10-CM

## 2020-09-06 MED ORDER — HYDROCODONE-ACETAMINOPHEN 10-325 MG PO TABS: 2.0000 | ORAL_TABLET | Freq: Two times a day (BID) | ORAL | 0 refills | Status: DC

## 2020-09-06 NOTE — Telephone Encounter (Signed)
Patient is requesting a refill of the following medications: Requested Prescriptions   Pending Prescriptions Disp Refills   HYDROcodone-acetaminophen (NORCO) 10-325 MG tablet 120 tablet 0    Sig: Take 2 tablets by mouth 2 (two) times daily. For chronic lower back and neck pain    Date of patient request: 09/06/20  Last office visit: 06/14/20 Date of last refill: 08/07/20 Last refill amount: 120 Follow up time period per chart: 09/13/20

## 2020-09-06 NOTE — Telephone Encounter (Signed)
   Patient requesting refill for HYDROcodone-acetaminophen (NORCO) 10-325 MG tablet   Ph[facility]acy [facility] [place], [place] [address]

## 2020-09-12 ENCOUNTER — Encounter: Payer: Self-pay | Admitting: Internal Medicine

## 2020-09-12 NOTE — Progress Notes (Signed)
Subjective:    Patient ID: Tonya [name], female    DOB: 14-Aug-1940, 80 y.o.   MRN: [id number]  HPI The patient is here for follow up of their chronic medical problems, including pain management for chronic lower back pain, anxiety, depression,  prediabetes, htn, gerd, hypothyroidism, OAB  Indication for chronic opioid: chronic lower back pain Medication and dose: norco 10+325 mg 1-2 tabs BID # pills per month: 120 Last UDS date: 05/2019 Pain contract signed (Y/N): yes, 05/2019 Date narcotic database last reviewed 09/13/20   Pain assessment:  Pain intensity: 2/10  Amount of pain relief with medication: very good Use of pain medications: appropriately taking medications Side effects:  no Sleep:  fair - but not related to pain - dog and bladder Mood:  fair - ? depression not controlled   Functional activities: continues to be active:  yes  She still feels shut up since covid.  She dropped out of the bridge club.  She is having some financial concerns.     Medications and allergies reviewed with patient and updated if appropriate.  Patient Active Problem List   Diagnosis Date Noted   Chest pain 09/13/2020   Urinary frequency 10/18/2018   Thrombocytopenia (HCC) 10/04/2018   Nausea vomiting and diarrhea    Arthralgia 08/24/2018   Hypertension 08/23/2018   DOE (dyspnea on exertion) 01/07/2018   BRBPR (bright red blood per rectum) 01/07/2018   Acute pain of right shoulder 01/07/2018   Pain management contract signed, 01/07/18 01/07/2018   Osteoarthritis of hand 07/05/2017   Prediabetes 09/21/2016   Encounter for chronic pain management 05/27/2016   Chronic lower back pain 01/15/2016   Osteopenia, high FRAX 01/14/2016   DDD (degenerative disc disease), cervical S/P Decompressive laminectomy and Fusion 11/15/2012   Asthma 11/15/2012   Hypothyroidism 11/15/2012   Depression 11/15/2012   Constipation 11/15/2012   Overactive bladder 11/15/2012   Anxiety 11/15/2012    Cervical spinal stenosis 10/24/2012   Fracture of cervical vertebra ([place]) 08/31/2012   Scoliosis 07/05/2012   Degeneration of intervertebral disc of lumbosacral region 07/04/2012   GERD (gastroesophageal reflux disease) 06/02/2007    Current Outpatient Medications on File Prior to Visit  Medication Sig Dispense Refill   amLODipine (NORVASC) 2.5 MG tablet TAKE 1 TABLET BY MOUTH EVERY DAY 90 tablet 0   budesonide-formoterol (SYMBICORT) 80-4.5 MCG/ACT inhaler TAKE 2 PUFFS BY MOUTH TWICE A DAY 30.6 each 6   Calcium Citrate 250 MG TABS Take 1 tablet by mouth daily.      COLLAGEN PO Take by mouth.     cycloSPORINE (RESTASIS) 0.05 % ophthalmic emulsion Place 1 drop into both eyes 2 (two) times daily.     dimenhyDRINATE (DRAMAMINE) 50 MG tablet Take 50 mg by mouth every 8 (eight) hours as needed for nausea.     diphenhydrAMINE HCl (BENADRYL ALLERGY PO) Take 1 tablet by mouth daily as needed (sleep).      famotidine (PEPCID) 40 MG tablet TAKE 1 TABLET BY MOUTH EVERY DAY 90 tablet 1   HYDROcodone-acetaminophen (NORCO) 10-325 MG tablet Take 2 tablets by mouth 2 (two) times daily. For chronic lower back and neck pain 120 tablet 0   levalbuterol (XOPENEX HFA) 45 MCG/ACT inhaler Inhale 1-2 puffs into the lungs every 4 (four) hours as needed for wheezing. 1 Inhaler 8   Magnesium 125 MG CAPS Take 125 mg by mouth every evening.      nebivolol (BYSTOLIC) 2.5 MG tablet TAKE 1 TABLET BY MOUTH EVERY DAY  90 tablet 1   omeprazole (PRILOSEC) 20 MG capsule Take 1 capsule (20 mg total) by mouth daily as needed. 30 capsule 5   oxybutynin (DITROPAN-XL) 10 MG 24 hr tablet TAKE 1 TABLET BY MOUTH EVERYDAY AT BEDTIME 90 tablet 1   SYNTHROID 50 MCG tablet TAKE 1 TABLET BY MOUTH DAILY BEFORE BREAKFAST 90 tablet 1   telmisartan (MICARDIS) 40 MG tablet TAKE 1 TABLET BY MOUTH EVERY DAY 90 tablet 1   vitamin C (ASCORBIC ACID) 500 MG tablet Take 500 mg by mouth daily.     vitamin E 100 UNIT capsule Take 100 Units by mouth daily.      No current facility-administered medications on file prior to visit.    Past Medical History:  Diagnosis Date   Anemia    Asthma    Back pain, chronic    Diverticulosis    H/O arthrodesis 11/22/2012   History of hysterectomy    Hypertension    Spinal stenosis    Synovial cyst     Past Surgical History:  Procedure Laterality Date   ABDOMINAL HYSTERECTOMY     BACK SURGERY     CYST REMOVAL TRUNK      Social History   Socioeconomic History   Marital status: Widowed    Spouse name: Not on file   Number of children: Not on file   Years of education: Not on file   Highest education level: Not on file  Occupational History   Not on file  Tobacco Use   Smoking status: Never   Smokeless tobacco: Never  Substance and Sexual Activity   Alcohol use: Yes    Comment: socially   Drug use: No   Sexual activity: Not on file  Other Topics Concern   Not on file  Social History Narrative   Not currently exercising   Social Determinants of Health   Financial Resource Strain: Low Risk    Difficulty of Paying Living Expenses: Not hard at all  Food Insecurity: No Food Insecurity   Worried About Running Out of Food in the Last Year: Never true   [place] in the Last Year: Never true  Transportation Needs: No Transportation Needs   Lack of Transportation (Medical): No   Lack of Transportation (Non-Medical): No  Physical Activity: Not on file  Stress: No Stress Concern Present   Feeling of Stress : Not at all  Social Connections: Moderately Integrated   Frequency of Communication with Friends and Family: More than three times a week   Frequency of Social Gatherings with Friends and Family: More than three times a week   Attends Religious Services: More than 4 times per year   Active Member of [employer] or Organizations: Yes   Attends Archivist Meetings: More than 4 times per year   Marital Status: Widowed    Family History  Problem Relation Age of Onset    Other Father        carotid artery disease   Heart disease Brother    Alcohol abuse Brother    Breast cancer Neg Hx     Review of Systems  Constitutional:  Negative for chills and fever.  Respiratory:  Positive for shortness of breath (only strenuous activity - not new). Negative for cough and wheezing.   Cardiovascular:  Positive for chest pain (twice while in bed - ? reflux, sedentary - no chest pain any other time). Negative for palpitations and leg swelling.  Gastrointestinal:  Positive for nausea (  one episode) and vomiting (one episode).  Genitourinary:  Positive for frequency.  Neurological:  Negative for light-headedness and headaches.      Objective:   Vitals:   09/13/20 1306  BP: 126/60  Pulse: 72  Temp: 98 F (36.7 C)  SpO2: 97%   BP Readings from Last 3 Encounters:  09/13/20 126/60  06/14/20 124/78  03/15/20 138/72   Wt Readings from Last 3 Encounters:  09/13/20 103 lb 12.8 oz (47.1 kg)  06/14/20 102 lb (46.3 kg)  03/15/20 102 lb (46.3 kg)   Body mass index is 22.46 kg/m.   Physical Exam    Constitutional: Appears well-developed and well-nourished. No distress.  HENT:  Head: Normocephalic and atraumatic.  Neck: Neck supple. No tracheal deviation present. No thyromegaly present.  No cervical lymphadenopathy Cardiovascular: Normal rate, regular rhythm and normal heart sounds.   No murmur heard. No carotid bruit .  No edema Pulmonary/Chest: Effort normal and breath sounds normal. No respiratory distress. No has no wheezes. No rales.  Skin: Skin is warm and dry. Not diaphoretic.  Psychiatric: Normal mood and affect. Behavior is normal.      Assessment & Plan:    See Problem List for Assessment and Plan of chronic medical problems.    This visit occurred during the SARS-CoV-2 public health emergency.  Safety protocols were in place, including screening questions prior to the visit, additional usage of staff PPE, and extensive cleaning of exam room  while observing appropriate contact time as indicated for disinfecting solutions.

## 2020-09-12 NOTE — Patient Instructions (Addendum)
  Blood work was ordered.  An EKG was done.   Medications changes include :   increase Duloxetine to 60 mg daily  Your prescription(s) have been submitted to your pharmacy. Please take as directed and contact our office if you believe you are having problem(s) with the medication(s).   Please followup in 3 months

## 2020-09-13 ENCOUNTER — Ambulatory Visit (INDEPENDENT_AMBULATORY_CARE_PROVIDER_SITE_OTHER): Payer: Medicare Other | Admitting: Internal Medicine

## 2020-09-13 ENCOUNTER — Other Ambulatory Visit: Payer: Self-pay

## 2020-09-13 VITALS — BP 126/60 | HR 72 | Temp 98.0°F | Ht <= 58 in | Wt 103.8 lb

## 2020-09-13 DIAGNOSIS — N3281 Overactive bladder: Secondary | ICD-10-CM

## 2020-09-13 DIAGNOSIS — M545 Low back pain, unspecified: Secondary | ICD-10-CM | POA: Diagnosis not present

## 2020-09-13 DIAGNOSIS — R7303 Prediabetes: Secondary | ICD-10-CM

## 2020-09-13 DIAGNOSIS — I10 Essential (primary) hypertension: Secondary | ICD-10-CM

## 2020-09-13 DIAGNOSIS — R079 Chest pain, unspecified: Secondary | ICD-10-CM | POA: Diagnosis not present

## 2020-09-13 DIAGNOSIS — G8929 Other chronic pain: Secondary | ICD-10-CM

## 2020-09-13 DIAGNOSIS — F3289 Other specified depressive episodes: Secondary | ICD-10-CM | POA: Diagnosis not present

## 2020-09-13 DIAGNOSIS — E039 Hypothyroidism, unspecified: Secondary | ICD-10-CM | POA: Diagnosis not present

## 2020-09-13 DIAGNOSIS — K219 Gastro-esophageal reflux disease without esophagitis: Secondary | ICD-10-CM | POA: Diagnosis not present

## 2020-09-13 DIAGNOSIS — F419 Anxiety disorder, unspecified: Secondary | ICD-10-CM | POA: Diagnosis not present

## 2020-09-13 LAB — COMPREHENSIVE METABOLIC PANEL
ALT: 18 U/L (ref 0–35)
AST: 21 U/L (ref 0–37)
Albumin: 4.3 g/dL (ref 3.5–5.2)
Alkaline Phosphatase: 61 U/L (ref 39–117)
BUN: 33 mg/dL — ABNORMAL HIGH (ref 6–23)
CO2: 29 mEq/L (ref 19–32)
Calcium: 9.6 mg/dL (ref 8.4–10.5)
Chloride: 101 mEq/L (ref 96–112)
Creatinine, Ser: 1.09 mg/dL (ref 0.40–1.20)
GFR: 48.59 mL/min — ABNORMAL LOW (ref >60.00)
Glucose, Bld: 77 mg/dL (ref 70–99)
Potassium: 4.2 mEq/L (ref 3.5–5.1)
Sodium: 138 mEq/L (ref 135–145)
Total Bilirubin: 0.6 mg/dL (ref 0.2–1.2)
Total Protein: 7 g/dL (ref 6.0–8.3)

## 2020-09-13 LAB — HEMOGLOBIN A1C: Hgb A1c MFr Bld: 6.2 % (ref 4.6–6.5)

## 2020-09-13 LAB — TSH: TSH: 8 u[IU]/mL — ABNORMAL HIGH (ref 0.35–5.50)

## 2020-09-13 MED ORDER — DULOXETINE HCL 60 MG PO CPEP: 60.0000 mg | ORAL_CAPSULE | Freq: Every day | ORAL | 1 refills | Status: DC

## 2020-09-13 NOTE — Assessment & Plan Note (Addendum)
Chronic ? Controlled - not motivated to go out and do things, feels shut up at home ? Depression really controlled Will try increasing duloxetine to 60 mg daily

## 2020-09-13 NOTE — Assessment & Plan Note (Signed)
Chronic BP well controlled Continue amlodipine 2.5 mg daily, Bystolic 2.5 mg daily, telmisartan 40 mg daily cmp

## 2020-09-13 NOTE — Assessment & Plan Note (Addendum)
Chronic GERD not controlled Continue omeprazole 20 mg daily as needed, famotidine 40 mg daily prn  Having gerd - take pepcid 40 mg daily for a week or two and then prn

## 2020-09-13 NOTE — Assessment & Plan Note (Signed)
Chronic Check a1c Low sugar / carb diet Stressed regular exercise  

## 2020-09-13 NOTE — Assessment & Plan Note (Signed)
  Fairly controlled Continue oxybutynin xl 10 mg daily

## 2020-09-13 NOTE — Assessment & Plan Note (Signed)
Chronic lower back pain She is taking her current pain regimen appropriately and it is helping to control her pain No side effects [place] controlled substance database confirm she is taking the medication appropriately Continue Norco 10-325 mg 2 tabs twice daily as needed follow-up 3 months

## 2020-09-13 NOTE — Assessment & Plan Note (Addendum)
Acute atypical Two episodes at night while in bed - likely gerd Will check ekg today Deferred cardiology eval EKG today NSR at 68 bpm, normal EKG, no change from EKG from 2014 If this occurs again - stressed she needs to let me know so I can refer her to cardio, but her pain sounds very atypical for cardiac ischemia and her EKG looks good so she will just monitor for now

## 2020-09-13 NOTE — Assessment & Plan Note (Signed)
Chronic  Clinically euthyroid Currently taking Synthroid 50 mcg daily Check tsh  Titrate med dose if needed

## 2020-09-13 NOTE — Assessment & Plan Note (Signed)
Chronic Controlled, stable Continue duloxetine 30 mg daily  

## 2020-09-13 NOTE — Assessment & Plan Note (Signed)
Chronic Overall pain is controlled Pain medication is helping and is well-tolerated. She is taking pain medications appropriately Continue Norco 10-325 mg 2 tabs twice daily as needed Follow-up in 3 months

## 2020-09-14 ENCOUNTER — Other Ambulatory Visit: Payer: Self-pay | Admitting: Internal Medicine

## 2020-09-14 MED ORDER — SYNTHROID 75 MCG PO TABS: 75.0000 ug | ORAL_TABLET | Freq: Every day | ORAL | 1 refills | Status: DC

## 2020-10-08 ENCOUNTER — Telehealth: Payer: Self-pay | Admitting: Internal Medicine

## 2020-10-08 DIAGNOSIS — M5440 Lumbago with sciatica, unspecified side: Secondary | ICD-10-CM

## 2020-10-08 DIAGNOSIS — G8929 Other chronic pain: Secondary | ICD-10-CM

## 2020-10-08 DIAGNOSIS — M4802 Spinal stenosis, cervical region: Secondary | ICD-10-CM

## 2020-10-08 MED ORDER — HYDROCODONE-ACETAMINOPHEN 10-325 MG PO TABS: 2.0000 | ORAL_TABLET | Freq: Two times a day (BID) | ORAL | 0 refills | Status: DC

## 2020-10-08 NOTE — Telephone Encounter (Signed)
   Patient requesting refill for HYDROcodone-acetaminophen (NORCO) 10-325 MG tablet  Pharmacy [facility], [place] [address]

## 2020-10-10 DIAGNOSIS — C44612 Basal cell carcinoma of skin of right upper limb, including shoulder: Secondary | ICD-10-CM | POA: Diagnosis not present

## 2020-10-10 DIAGNOSIS — L608 Other nail disorders: Secondary | ICD-10-CM | POA: Diagnosis not present

## 2020-10-10 DIAGNOSIS — D2272 Melanocytic nevi of left lower limb, including hip: Secondary | ICD-10-CM | POA: Diagnosis not present

## 2020-10-10 DIAGNOSIS — W57XXXA Bitten or stung by nonvenomous insect and other nonvenomous arthropods, initial encounter: Secondary | ICD-10-CM | POA: Diagnosis not present

## 2020-10-10 DIAGNOSIS — L578 Other skin changes due to chronic exposure to nonionizing radiation: Secondary | ICD-10-CM | POA: Diagnosis not present

## 2020-10-10 DIAGNOSIS — L814 Other melanin hyperpigmentation: Secondary | ICD-10-CM | POA: Diagnosis not present

## 2020-10-10 DIAGNOSIS — D485 Neoplasm of uncertain behavior of skin: Secondary | ICD-10-CM | POA: Diagnosis not present

## 2020-10-10 DIAGNOSIS — Z85828 Personal history of other malignant neoplasm of skin: Secondary | ICD-10-CM | POA: Diagnosis not present

## 2020-10-10 DIAGNOSIS — S30860A Insect bite (nonvenomous) of lower back and pelvis, initial encounter: Secondary | ICD-10-CM | POA: Diagnosis not present

## 2020-10-10 DIAGNOSIS — L570 Actinic keratosis: Secondary | ICD-10-CM | POA: Diagnosis not present

## 2020-10-10 DIAGNOSIS — L720 Epidermal cyst: Secondary | ICD-10-CM | POA: Diagnosis not present

## 2020-10-16 ENCOUNTER — Other Ambulatory Visit: Payer: Self-pay | Admitting: Internal Medicine

## 2020-11-07 ENCOUNTER — Telehealth: Payer: Self-pay | Admitting: Internal Medicine

## 2020-11-07 DIAGNOSIS — M4802 Spinal stenosis, cervical region: Secondary | ICD-10-CM

## 2020-11-07 DIAGNOSIS — G8929 Other chronic pain: Secondary | ICD-10-CM

## 2020-11-07 NOTE — Telephone Encounter (Signed)
1.Medication Requested: HYDROcodone-acetaminophen (NORCO) 10-325 MG tablet  2. Pharmacy (Name, [address], [place]): [facility] [address]  Phone:  [phone] Fax:  [phone]   3. On Med List: yes  4. Last Visit with PCP: 07.08.22  5. Next visit date with PCP: 10.11.22   Agent: Please be advised that RX refills may take up to 3 business days. We ask that you follow-up with your pharmacy.

## 2020-11-08 MED ORDER — HYDROCODONE-ACETAMINOPHEN 10-325 MG PO TABS: 2.0000 | ORAL_TABLET | Freq: Two times a day (BID) | ORAL | 0 refills | Status: DC

## 2020-11-08 NOTE — Telephone Encounter (Signed)
sent 

## 2020-12-04 ENCOUNTER — Telehealth: Payer: Self-pay | Admitting: Internal Medicine

## 2020-12-04 NOTE — Telephone Encounter (Signed)
Called and left message for patient. She needs to schedule VV for medication.  She can be scheduled for a visit with Judson Roch on tomorrow.

## 2020-12-04 NOTE — Telephone Encounter (Signed)
Patient COVID+ 09.28.22 (at home test)  headaches, sore throat, & congestion, fatigue  Wants to know if provider is willing to send antiviral to pharmacy:  [facility], [place]  Phone:  [phone] Fax:  [phone]

## 2020-12-05 ENCOUNTER — Other Ambulatory Visit: Payer: Self-pay

## 2020-12-05 ENCOUNTER — Telehealth (INDEPENDENT_AMBULATORY_CARE_PROVIDER_SITE_OTHER): Payer: Medicare Other | Admitting: Family Medicine

## 2020-12-05 DIAGNOSIS — U071 COVID-19: Secondary | ICD-10-CM

## 2020-12-05 MED ORDER — BENZONATATE 100 MG PO CAPS: 100.0000 mg | ORAL_CAPSULE | Freq: Three times a day (TID) | ORAL | 0 refills | Status: DC | PRN

## 2020-12-05 MED ORDER — MOLNUPIRAVIR EUA 200MG CAPSULE: 4.0000 | ORAL_CAPSULE | Freq: Two times a day (BID) | ORAL | 0 refills | Status: AC

## 2020-12-05 NOTE — Progress Notes (Signed)
Virtual Visit via Telephone Note  I connected with Tonya Tonya [name] on 12/05/20 at 12:20 PM EDT by telephone and verified that I am speaking with the correct person using two identifiers.   I discussed the limitations, risks, security and privacy concerns of performing an evaluation and management service by telephone and the availability of in person appointments. I also discussed with the patient that there may be a patient responsible charge related to this service. The patient expressed understanding and agreed to proceed.  Location patient: home, [place] Location provider: work or home office Participants present for the call: patient, provider Patient did not have a visit with me in the prior 7 days to address this/these issue(s).   History of Present Illness:  Acute telemedicine visit for Covid19: -Onset: yesterday; tested positive for covid on home test -was exposed to covid over the weekend -Symptoms include: Tonya [name], hoarseness, nasal congestion, feels a little tired, cough -Denies:CP, SOB, NVD, inability to eat/drink/get out of bed -Pertinent past medical history:see below -Pertinent medication allergies: Allergies  Allergen Reactions   Prochlorperazine Edisylate Other (See Comments)    (aka Compazine) causes facial stiffness   Other Nausea And Vomiting    [place] (shrimp is okay)  -COVID-19 vaccine status: 2 doses and 1 booster  Past Medical History:  Diagnosis Date   Anemia    Asthma    Back pain, chronic    Diverticulosis    H/O arthrodesis 11/22/2012   History of hysterectomy    Hypertension    Spinal stenosis    Synovial cyst    Current Outpatient Medications on File Prior to Visit  Medication Sig Dispense Refill   amLODipine (NORVASC) 2.5 MG tablet TAKE 1 TABLET BY MOUTH EVERY DAY 90 tablet 0   budesonide-formoterol (SYMBICORT) 80-4.5 MCG/ACT inhaler TAKE 2 PUFFS BY MOUTH TWICE A DAY 30.6 each 6   Calcium Citrate 250 MG TABS Take 1 tablet by mouth  daily.      COLLAGEN PO Take by mouth.     cycloSPORINE (RESTASIS) 0.05 % ophthalmic emulsion Place 1 drop into both eyes 2 (two) times daily.     dimenhyDRINATE (DRAMAMINE) 50 MG tablet Take 50 mg by mouth every 8 (eight) hours as needed for nausea.     diphenhydrAMINE HCl (BENADRYL ALLERGY PO) Take 1 tablet by mouth daily as needed (sleep).      DULoxetine (CYMBALTA) 60 MG capsule Take 1 capsule (60 mg total) by mouth daily. 90 capsule 1   famotidine (PEPCID) 40 MG tablet TAKE 1 TABLET BY MOUTH EVERY DAY 90 tablet 1   HYDROcodone-acetaminophen (NORCO) 10-325 MG tablet Take 2 tablets by mouth 2 (two) times daily. For chronic lower back and neck pain 120 tablet 0   levalbuterol (XOPENEX HFA) 45 MCG/ACT inhaler Inhale 1-2 puffs into the lungs every 4 (four) hours as needed for wheezing. 1 Inhaler 8   Magnesium 125 MG CAPS Take 125 mg by mouth every evening.      nebivolol (BYSTOLIC) 2.5 MG tablet TAKE 1 TABLET BY MOUTH EVERY DAY 90 tablet 1   omeprazole (PRILOSEC) 20 MG capsule Take 1 capsule (20 mg total) by mouth daily as needed. 30 capsule 5   oxybutynin (DITROPAN-XL) 10 MG 24 hr tablet TAKE 1 TABLET BY MOUTH EVERYDAY AT BEDTIME 90 tablet 1   SYNTHROID 75 MCG tablet Take 1 tablet (75 mcg total) by mouth daily before breakfast. 90 tablet 1   telmisartan (MICARDIS) 40 MG tablet TAKE 1 TABLET BY MOUTH  EVERY DAY 90 tablet 1   vitamin C (ASCORBIC ACID) 500 MG tablet Take 500 mg by mouth daily.     vitamin E 100 UNIT capsule Take 100 Units by mouth daily.     No current facility-administered medications on file prior to visit.      Observations/Objective: Patient sounds cheerful and well on the phone. I do not appreciate any SOB. Speech and thought processing are grossly intact. Patient reported vitals:  Assessment and Plan:  COVID-19   Discussed treatment options (infusions and oral options and risk of drug interactions), ideal treatment window, potential complications, isolation and  precautions for COVID-19.  Discussed possibility of rebound with antivirals and the need to reisolate if it should occur for 5 days. Checked for/reviewed any labs done in the last 90 days with GFR listed in HPI if available. After lengthy discussion, the patient opted for treatment with molnupiravir due to being higher risk for complications of covid or severe disease and other factors. Discussed EUA status of this drug and the fact that there is preliminary limited knowledge of risks/interactions/side effects per EUA document vs possible benefits and precautions. This information was shared with patient during the visit and also was provided in patient instructions. Also, advised that patient discuss risks/interactions and use with pharmacist/treatment team as well.  The patient did want a prescription for cough, Tessalon Rx sent.  Other symptomatic care measures summarized in patient instructions.  Advised to seek prompt in person care if worsening, new symptoms arise, or if is not improving with treatment. Advised of options for inperson care in case PCP office not available. Did let the patient know that I only do telemedicine shifts for [place] on Tuesdays and Thursdays and advised a follow up visit with PCP or at an [facility] if has further questions or concerns.   Follow Up Instructions:  I did not refer this patient for an OV with me in the next 24 hours for this/these issue(s).  I discussed the assessment and treatment plan with the patient. The patient was provided an opportunity to ask questions and all were answered. The patient agreed with the plan and demonstrated an understanding of the instructions.   I spent 16 minutes on the date of this visit in the care of this patient. See summary of tasks completed to properly care for this patient in the detailed notes above which also included counseling of above, review of PMH, medications, allergies, evaluation of the patient and ordering and/or   instructing patient on testing and care options.     Lucretia Kern, DO

## 2020-12-05 NOTE — Patient Instructions (Addendum)
HOME CARE TIPS:  -I sent the medication(s) we discussed to your pharmacy: Meds ordered this encounter  Medications   molnupiravir EUA (LAGEVRIO) 200 mg CAPS capsule    Sig: Take 4 capsules (800 mg total) by mouth 2 (two) times daily for 5 days.    Dispense:  40 capsule    Refill:  0   benzonatate (TESSALON PERLES) 100 MG capsule    Sig: Take 1 capsule (100 mg total) by mouth 3 (three) times daily as needed.    Dispense:  20 capsule    Refill:  0     -I sent in the [place] treatment or referral you requested per our discussion. Please see the information provided below and discuss further with the pharmacist/treatment team.   -there is a chance of rebound illness after finishing your treatment. If you become sick again please isolate for an additional 5 days.   -can use tylenol  if needed for fevers, aches and pains per instructions  -can use nasal saline a few times per day if you have nasal congestion  -stay hydrated, drink plenty of fluids and eat small healthy meals - avoid dairy  -follow up with your doctor in 2-3 days unless improving and feeling better  -stay home while sick, except to seek medical care. If you have COVID19, ideally it would be best to stay home for a full 10 days since the onset of symptoms PLUS one day of no fever and feeling better. Wear a good mask that fits snugly (such as N95 or KN95) if around others to reduce the risk of transmission.  It was nice to meet you today, and I really hope you are feeling better soon. I help [facility] out with telemedicine visits on Tuesdays and Thursdays and am available for visits on those days. If you have any concerns or questions following this visit please schedule a follow up visit with your Primary Care doctor or seek care at a local urgent care clinic to avoid delays in care.    Seek in person care or schedule a follow up video visit promptly if your symptoms worsen, new concerns arise or you are not improving with  treatment. Call 911 and/or seek emergency care if your symptoms are severe or life threatening.    Fact Sheet for Patients And Caregivers Emergency Use Authorization (EUA) Of LAGEVRIOT (molnupiravir) capsules For Coronavirus Disease 2019 (COVID-19)  What is the most important information I should know about LAGEVRIO? LAGEVRIO may cause serious side effects, including: ? LAGEVRIO may cause harm to your unborn baby. It is not known if LAGEVRIO will harm your baby if you take LAGEVRIO during pregnancy. o LAGEVRIO is not recommended for use in pregnancy. o LAGEVRIO has not been studied in pregnancy. LAGEVRIO was studied in pregnant animals only. When LAGEVRIO was given to pregnant animals, LAGEVRIO caused harm to their unborn babies. o You and your healthcare provider may decide that you should take LAGEVRIO during pregnancy if there are no other COVID-19 treatment options approved or authorized by the FDA that are accessible or clinically appropriate for you. o If you and your healthcare provider decide that you should take LAGEVRIO during pregnancy, you and your healthcare provider should discuss the known and potential benefits and the potential risks of taking LAGEVRIO during pregnancy. For individuals who are able to become pregnant: ? You should use a reliable method of birth control (contraception) consistently and correctly during treatment with LAGEVRIO and for 4 days after the last  dose of LAGEVRIO. Talk to your healthcare provider about reliable birth control methods. ? Before starting treatment with [facility] your healthcare provider may do a pregnancy test to see if you are pregnant before starting treatment with LAGEVRIO. ? Tell your healthcare provider right away if you become pregnant or think you may be pregnant during treatment with LAGEVRIO. Pregnancy Surveillance Program: ? There is a pregnancy surveillance program for individuals who take LAGEVRIO during pregnancy. The  purpose of this program is to collect information about the health of you and your baby. Talk to your healthcare provider about how to take part in this program. ? If you take LAGEVRIO during pregnancy and you agree to participate in the pregnancy surveillance program and allow your healthcare provider to share your information with [facility], then your healthcare provider will report your use of [place] during pregnancy to [facility]. by calling [phone] or PeacefulBlog.es. For individuals who are sexually active with partners who are able to become pregnant: ? It is not known if LAGEVRIO can affect sperm. While the risk is regarded as low, animal studies to fully assess the potential for LAGEVRIO to affect the babies of males treated with LAGEVRIO have not been completed. A reliable method of birth control (contraception) should be used consistently and correctly during treatment with LAGEVRIO and for at least 3 months after the last dose. The risk to sperm beyond 3 months is not known. Studies to understand the risk to sperm beyond 3 months are ongoing. Talk to your healthcare provider about reliable birth control methods. Talk to your healthcare provider if you have questions or concerns about how LAGEVRIO may affect sperm. You are being given this fact sheet because your healthcare provider believes it is necessary to provide you with LAGEVRIO for the treatment of adults with mild-to-moderate coronavirus disease 2019 (COVID-19) with positive results of direct SARS-CoV-2 viral testing, and who are at high risk for progression to severe COVID-19 including hospitalization or death, and for whom other COVID-19 treatment options approved or authorized by the FDA are not accessible or clinically appropriate. The U.S. Food and Drug Administration (FDA) has issued an Emergency Use Authorization (EUA) to make LAGEVRIO available during the COVID-19 pandemic  (for more details about an EUA please see "What is an Emergency Use Authorization?" at the end of this document). LAGEVRIO is not an FDA-approved medicine in the [place]. Read this Fact Sheet for information about LAGEVRIO. Talk to your healthcare provider about your options if you have any questions. It is your choice to take LAGEVRIO.  What is COVID-19? COVID-19 is caused by a virus called a coronavirus. You can get COVID-19 through close contact with another person who has the virus. COVID-19 illnesses have ranged from very mild-to-severe, including illness resulting in death. While information so far suggests that most COVID-19 illness is mild, serious illness can happen and may cause some of your other medical conditions to become worse. Older people and people of all ages with severe, long lasting (chronic) medical conditions like heart disease, lung disease and diabetes, for example seem to be at higher risk of being hospitalized for COVID-19.  What is LAGEVRIO? LAGEVRIO is an investigational medicine used to treat mild-to-moderate COVID-19 in adults: ? with positive results of direct SARS-CoV-2 viral testing, and ? who are at high risk for progression to severe COVID-19 including hospitalization or death, and for whom other COVID-19 treatment options approved or authorized by the FDA are not  accessible or clinically appropriate. The FDA has authorized the emergency use of LAGEVRIO for the treatment of mild-tomoderate COVID-19 in adults under an EUA. For more information on EUA, see the "What is an Emergency Use Authorization (EUA)?" section at the end of this Fact Sheet. LAGEVRIO is not authorized: ? for use in people less than 52 years of age. ? for prevention of COVID-19. ? for people needing hospitalization for COVID-19. ? for use for longer than 5 consecutive days.  What should I tell my healthcare provider before I take LAGEVRIO? Tell your healthcare provider if  you: ? Have any allergies ? Are breastfeeding or plan to breastfeed ? Have any serious illnesses ? Are taking any medicines (prescription, over-the-counter, vitamins, or herbal products).  How do I take LAGEVRIO? ? Take LAGEVRIO exactly as your healthcare provider tells you to take it. ? Take 4 capsules of LAGEVRIO every 12 hours (for example, at 8 am and at 8 pm) ? Take LAGEVRIO for 5 days. It is important that you complete the full 5 days of treatment with LAGEVRIO. Do not stop taking LAGEVRIO before you complete the full 5 days of treatment, even if you feel better. ? Take LAGEVRIO with or without food. ? You should stay in isolation for as long as your healthcare provider tells you to. Talk to your healthcare provider if you are not sure about how to properly isolate while you have COVID-19. ? Swallow LAGEVRIO capsules whole. Do not open, break, or crush the capsules. If you cannot swallow capsules whole, tell your healthcare provider. ? What to do if you miss a dose: o If it has been less than 10 hours since the missed dose, take it as soon as you remember o If it has been more than 10 hours since the missed dose, skip the missed dose and take your dose at the next scheduled time. ? Do not double the dose of LAGEVRIO to make up for a missed dose.  What are the important possible side effects of LAGEVRIO? ? See, "What is the most important information I should know about LAGEVRIO?" ? Allergic Reactions. Allergic reactions can happen in people taking LAGEVRIO, even after only 1 dose. Stop taking LAGEVRIO and call your healthcare provider right away if you get any of the following symptoms of an allergic reaction: o hives o rapid heartbeat o trouble swallowing or breathing o swelling of the mouth, lips, or face o throat tightness o hoarseness o skin rash The most common side effects of LAGEVRIO are: ? diarrhea ? nausea ? dizziness These are not all the possible side effects  of LAGEVRIO. Not many people have taken LAGEVRIO. Serious and unexpected side effects may happen. This medicine is still being studied, so it is possible that all of the risks are not known at this time.  What other treatment choices are there?  Veklury (remdesivir) is FDA-approved as an intravenous (IV) infusion for the treatment of mildto-moderate GYJEH-63 in certain adults and children. Talk with your doctor to see if Marijean Heath is appropriate for you. Like LAGEVRIO, FDA may also allow for the emergency use of other medicines to treat people with COVID-19. Go to LacrosseProperties.si for more information. It is your choice to be treated or not to be treated with LAGEVRIO. Should you decide not to take it, it will not change your standard medical care.  What if I am breastfeeding? Breastfeeding is not recommended during treatment with LAGEVRIO and for 4 days after the last dose  of LAGEVRIO. If you are breastfeeding or plan to breastfeed, talk to your healthcare provider about your options and specific situation before taking LAGEVRIO.  How do I report side effects with LAGEVRIO? Contact your healthcare provider if you have any side effects that bother you or do not go away. Report side effects to FDA MedWatch at SmoothHits.hu or call 1-800-FDA-1088 (1- (352)499-4035).  How should I store [place]? ? Store LAGEVRIO capsules at room temperature between 43F to 65F (20C to 25C). ? Keep LAGEVRIO and all medicines out of the reach of children and pets. How can I learn more about COVID-19? ? Ask your healthcare provider. ? Visit SeekRooms.co.uk ? Contact your local or state public health department. ? Call [facility] at [phone] (toll free in the [place] ? Visit www.molnupiravir.com  What Is an Emergency Use Authorization (EUA)? The [place] FDA has made  [place] available under an emergency access mechanism called an Emergency Use Authorization (EUA) The EUA is supported by a Presenter, broadcasting Health and Human Service (HHS) declaration that circumstances exist to justify emergency use of drugs and biological products during the COVID-19 pandemic. LAGEVRIO for the treatment of mild-to-moderate COVID-19 in adults with positive results of direct SARS-CoV-2 viral testing, who are at high risk for progression to severe COVID-19, including hospitalization or death, and for whom alternative COVID-19 treatment options approved or authorized by FDA are not accessible or clinically appropriate, has not undergone the same type of review as an FDA-approved product. In issuing an EUA under the JWJXB-14 public health emergency, the FDA has determined, among other things, that based on the total amount of scientific evidence available including data from adequate and well-controlled clinical trials, if available, it is reasonable to believe that the product may be effective for diagnosing, treating, or preventing COVID-19, or a serious or life-threatening disease or condition caused by COVID-19; that the known and potential benefits of the product, when used to diagnose, treat, or prevent such disease or condition, outweigh the known and potential risks of such product; and that there are no adequate, approved, and available alternatives.  All of these criteria must be met to allow for the product to be used in the treatment of patients during the COVID-19 pandemic. The EUA for LAGEVRIO is in effect for the duration of the COVID-19 declaration justifying emergency use of LAGEVRIO, unless terminated or revoked (after which LAGEVRIO may no longer be used under the EUA). For patent information: http://rogers.info/ Copyright  2021-2022 [place], [place] and its affiliates. All rights reserved. [id number] Revised: March  2022

## 2020-12-11 ENCOUNTER — Telehealth: Payer: Self-pay | Admitting: Internal Medicine

## 2020-12-11 DIAGNOSIS — M5440 Lumbago with sciatica, unspecified side: Secondary | ICD-10-CM

## 2020-12-11 DIAGNOSIS — M4802 Spinal stenosis, cervical region: Secondary | ICD-10-CM

## 2020-12-11 DIAGNOSIS — G8929 Other chronic pain: Secondary | ICD-10-CM

## 2020-12-11 MED ORDER — HYDROCODONE-ACETAMINOPHEN 10-325 MG PO TABS: 2.0000 | ORAL_TABLET | Freq: Two times a day (BID) | ORAL | 0 refills | Status: DC

## 2020-12-11 NOTE — Telephone Encounter (Signed)
1.Medication Requested:  HYDROcodone-acetaminophen (NORCO) 10-325 MG tablet    2. Pharmacy (Name, Street, [place]): [facility] [place]  Phone:  [phone] Fax:  [phone]   3. On Med List: yes  4. Last Visit with PCP: 07.08.22  5. Next visit date with PCP: 10.11.22   Agent: Please be advised that RX refills may take up to 3 business days. We ask that you follow-up with your pharmacy.

## 2020-12-16 DIAGNOSIS — U071 COVID-19: Secondary | ICD-10-CM | POA: Insufficient documentation

## 2020-12-16 NOTE — Progress Notes (Signed)
Virtual Visit via telephone Note  I connected with Tonya [name] on 12/17/20 at  1:00 PM EDT by telephone and verified that I am speaking with the correct person using two identifiers.   I discussed the limitations of evaluation and management by telemedicine and the availability of in person appointments. The patient expressed understanding and agreed to proceed.  Present for the visit:  Myself, Dr Billey Gosling, Dionicio Stall.  The patient is currently at home and I am in the office.    No referring provider.    History of Present Illness: This is an acute visit for covid.  Had VV 9/29.  She tested positive on 9/29.  She was prescribed molnupiravir and tessalon perles.  She continues to have a cough.  She continues to have fatigue.  She tested herself yesterday and was still positive.  Her symptoms are not getting worse.  She was initially coughing up sputum and it was yellow, but now it was clear.   She is concerned because she has never had a cough like this before.  She is still positive and was concerned about why she was still positive.     Review of Systems  Constitutional:  Positive for chills and malaise/fatigue. Negative for fever.  HENT:  Negative for sore throat.        No change in taste or smell  Respiratory:  Positive for cough and sputum production. Negative for shortness of breath and wheezing.   Gastrointestinal:  Negative for diarrhea.  Neurological:  Negative for headaches.     Social History   Socioeconomic History   Marital status: Widowed    Spouse name: Not on file   Number of children: Not on file   Years of education: Not on file   Highest education level: Not on file  Occupational History   Not on file  Tobacco Use   Smoking status: Never   Smokeless tobacco: Never  Substance and Sexual Activity   Alcohol use: Yes    Comment: socially   Drug use: No   Sexual activity: Not on file  Other Topics Concern   Not on file  Social History  Narrative   Not currently exercising   Social Determinants of Health   Financial Resource Strain: Not on file  Food Insecurity: Not on file  Transportation Needs: Not on file  Physical Activity: Not on file  Stress: Not on file  Social Connections: Not on file     Observations/Objective:    Assessment and Plan:  See Problem List for Assessment and Plan of chronic medical problems.   Follow Up Instructions:    I discussed the assessment and treatment plan with the patient. The patient was provided an opportunity to ask questions and all were answered. The patient agreed with the plan and demonstrated an understanding of the instructions.   The patient was advised to call back or seek an in-person evaluation if the symptoms worsen or if the condition fails to improve as anticipated.  Time spent on telephone call visit:  15 minutes  [name], MD

## 2020-12-17 ENCOUNTER — Encounter: Payer: Self-pay | Admitting: Internal Medicine

## 2020-12-17 ENCOUNTER — Telehealth (INDEPENDENT_AMBULATORY_CARE_PROVIDER_SITE_OTHER): Payer: Medicare Other | Admitting: Internal Medicine

## 2020-12-17 DIAGNOSIS — U071 COVID-19: Secondary | ICD-10-CM | POA: Diagnosis not present

## 2020-12-17 MED ORDER — LEVALBUTEROL TARTRATE 45 MCG/ACT IN AERO: 1.0000 | INHALATION_SPRAY | RESPIRATORY_TRACT | 8 refills | Status: AC | PRN

## 2020-12-17 NOTE — Assessment & Plan Note (Signed)
Acute COVID +9/29, and still positive Completed the antiviral treatment and has been taking Tessalon Perles She is still coughing and is still fatigued-cough has improved she is bringing up some sputum, but instead of being yellow it is now clear No fevers at this time, no shortness of breath or wheezing Discussed the symptoms will slowly get better.  I am not hearing any concerning symptoms that I think she needs to be seen in person, but if the cough is bothering her she can go to an urgent care facility to have someone listen to her chest and do an x-ray Continue symptomatic treatment Use inhalers as needed-has Symbicort and will be used twice daily She does need a refill of the Xopenex-use 1 to 2 puffs every 4 hours as needed-sent to pharmacy Discussed that she can still be positive for a couple of weeks, even more on some occasions She is still quarantining She will call there is any concerns or questions

## 2021-01-14 ENCOUNTER — Telehealth: Payer: Self-pay | Admitting: Internal Medicine

## 2021-01-14 DIAGNOSIS — M5440 Lumbago with sciatica, unspecified side: Secondary | ICD-10-CM

## 2021-01-14 DIAGNOSIS — G8929 Other chronic pain: Secondary | ICD-10-CM

## 2021-01-14 DIAGNOSIS — M4802 Spinal stenosis, cervical region: Secondary | ICD-10-CM

## 2021-01-14 MED ORDER — HYDROCODONE-ACETAMINOPHEN 10-325 MG PO TABS: 2.0000 | ORAL_TABLET | Freq: Two times a day (BID) | ORAL | 0 refills | Status: DC

## 2021-01-14 NOTE — Telephone Encounter (Signed)
1.Medication Requested: HYDROcodone-acetaminophen (NORCO) 10-325 MG tablet   2. Pharmacy (Name, Street, [place]): [facility], [place]  Phone:  [phone] Fax:  [phone]   3. On Med List: yes  4. Last Visit with PCP: 10.11.22  5. Next visit date with PCP: n/a   Agent: Please be advised that RX refills may take up to 3 business days. We ask that you follow-up with your pharmacy.

## 2021-02-04 DIAGNOSIS — C44612 Basal cell carcinoma of skin of right upper limb, including shoulder: Secondary | ICD-10-CM | POA: Diagnosis not present

## 2021-02-04 DIAGNOSIS — L570 Actinic keratosis: Secondary | ICD-10-CM | POA: Diagnosis not present

## 2021-02-10 ENCOUNTER — Other Ambulatory Visit: Payer: Self-pay | Admitting: Internal Medicine

## 2021-02-18 ENCOUNTER — Other Ambulatory Visit (HOSPITAL_BASED_OUTPATIENT_CLINIC_OR_DEPARTMENT_OTHER): Payer: Self-pay

## 2021-02-18 MED ORDER — INFLUENZA VAC A&B SA ADJ QUAD 0.5 ML IM PRSY
PREFILLED_SYRINGE | INTRAMUSCULAR | 0 refills | Status: DC
  Filled 2021-02-18: qty 0.5, 1d supply, fill #0

## 2021-02-19 ENCOUNTER — Telehealth: Payer: Self-pay | Admitting: Internal Medicine

## 2021-02-19 DIAGNOSIS — M4802 Spinal stenosis, cervical region: Secondary | ICD-10-CM

## 2021-02-19 DIAGNOSIS — M5440 Lumbago with sciatica, unspecified side: Secondary | ICD-10-CM

## 2021-02-19 DIAGNOSIS — G8929 Other chronic pain: Secondary | ICD-10-CM

## 2021-02-19 MED ORDER — HYDROCODONE-ACETAMINOPHEN 10-325 MG PO TABS: 2.0000 | ORAL_TABLET | Freq: Two times a day (BID) | ORAL | 0 refills | Status: DC

## 2021-02-19 NOTE — Telephone Encounter (Signed)
1.Medication Requested: HYDROcodone-acetaminophen (NORCO) 10-325 MG tablet 2. Pharmacy (Name, Street, [place]): [facility], [place] Phone:  [phone]  Fax:  [phone]     3. On Med List: y  22. Last Visit with PCP:  5. Next visit date with PCP:   Agent: Please be advised that RX refills may take up to 3 business days. We ask that you follow-up with your pharmacy.

## 2021-02-21 ENCOUNTER — Ambulatory Visit (INDEPENDENT_AMBULATORY_CARE_PROVIDER_SITE_OTHER): Payer: Medicare Other

## 2021-02-21 ENCOUNTER — Other Ambulatory Visit: Payer: Self-pay

## 2021-02-21 ENCOUNTER — Other Ambulatory Visit (HOSPITAL_BASED_OUTPATIENT_CLINIC_OR_DEPARTMENT_OTHER): Payer: Self-pay

## 2021-02-21 DIAGNOSIS — Z0000 Encounter for general adult medical examination without abnormal findings: Secondary | ICD-10-CM

## 2021-02-21 NOTE — Progress Notes (Signed)
Subjective:   Tonya [name] is a 80 y.o. female who presents for Medicare Annual (Subsequent) preventive examination.  I connected with Tonya [name] today by telephone and verified that I am speaking with the correct person using two identifiers. Location patient: home Location provider: work Persons participating in the virtual visit: patient, provider.   I discussed the limitations, risks, security and privacy concerns of performing an evaluation and management service by telephone and the availability of in person appointments. I also discussed with the patient that there may be a patient responsible charge related to this service. The patient expressed understanding and verbally consented to this telephonic visit.    Interactive audio and video telecommunications were attempted between this provider and patient, however failed, due to patient having technical difficulties OR patient did not have access to video capability.  We continued and completed visit with audio only.    Review of Systems     Cardiac Risk Factors include: advanced age (>67men, >64 women);hypertension;dyslipidemia     Objective:    Today's Vitals   There is no height or weight on file to calculate BMI.  Advanced Directives 02/21/2021 12/15/2019 10/03/2018 08/17/2015  Does Patient Have a Medical Advance Directive? Yes Yes Yes Yes  Type of Paramedic of [place];Living will [place];Living will [place];Living will;Out of facility DNR (pink MOST or yellow form) [place];Living will  Does patient want to make changes to medical advance directive? - No - Patient declined No - Patient declined -  Copy of [place] in Chart? No - copy requested No - copy requested No - copy requested -    Current Medications (verified) Outpatient Encounter Medications as of 02/21/2021  Medication Sig   amLODipine  (NORVASC) 2.5 MG tablet TAKE 1 TABLET BY MOUTH EVERY DAY   budesonide-formoterol (SYMBICORT) 80-4.5 MCG/ACT inhaler TAKE 2 PUFFS BY MOUTH TWICE A DAY   Calcium Citrate 250 MG TABS Take 1 tablet by mouth daily.    cycloSPORINE (RESTASIS) 0.05 % ophthalmic emulsion Place 1 drop into both eyes 2 (two) times daily.   dimenhyDRINATE (DRAMAMINE) 50 MG tablet Take 50 mg by mouth every 8 (eight) hours as needed for nausea.   diphenhydrAMINE HCl (BENADRYL ALLERGY PO) Take 1 tablet by mouth daily as needed (sleep).    famotidine (PEPCID) 40 MG tablet TAKE 1 TABLET BY MOUTH EVERY DAY   HYDROcodone-acetaminophen (NORCO) 10-325 MG tablet Take 2 tablets by mouth 2 (two) times daily. For chronic lower back and neck pain   levalbuterol (XOPENEX HFA) 45 MCG/ACT inhaler Inhale 1-2 puffs into the lungs every 4 (four) hours as needed for wheezing.   omeprazole (PRILOSEC) 20 MG capsule TAKE 1 CAPSULE BY MOUTH DAILY AS NEEDED.   oxybutynin (DITROPAN-XL) 10 MG 24 hr tablet TAKE 1 TABLET BY MOUTH EVERYDAY AT BEDTIME   SYNTHROID 75 MCG tablet Take 1 tablet (75 mcg total) by mouth daily before breakfast.   telmisartan (MICARDIS) 40 MG tablet TAKE 1 TABLET BY MOUTH EVERY DAY   benzonatate (TESSALON PERLES) 100 MG capsule Take 1 capsule (100 mg total) by mouth 3 (three) times daily as needed. (Patient not taking: Reported on 02/21/2021)   COLLAGEN PO Take by mouth. (Patient not taking: Reported on 02/21/2021)   DULoxetine (CYMBALTA) 60 MG capsule Take 1 capsule (60 mg total) by mouth daily. (Patient not taking: Reported on 02/21/2021)   influenza vaccine adjuvanted (FLUAD) 0.5 ML injection Inject into  the muscle.   Magnesium 125 MG CAPS Take 125 mg by mouth every evening.  (Patient not taking: Reported on 02/21/2021)   nebivolol (BYSTOLIC) 2.5 MG tablet TAKE 1 TABLET BY MOUTH EVERY DAY (Patient not taking: Reported on 02/21/2021)   vitamin C (ASCORBIC ACID) 500 MG tablet Take 500 mg by mouth daily. (Patient not taking:  Reported on 02/21/2021)   vitamin E 100 UNIT capsule Take 100 Units by mouth daily. (Patient not taking: Reported on 02/21/2021)   No facility-administered encounter medications on file as of 02/21/2021.    Allergies (verified) Prochlorperazine edisylate and Other   History: Past Medical History:  Diagnosis Date   Anemia    Asthma    Back pain, chronic    Diverticulosis    H/O arthrodesis 11/22/2012   History of hysterectomy    Hypertension    Spinal stenosis    Synovial cyst    Past Surgical History:  Procedure Laterality Date   ABDOMINAL HYSTERECTOMY     BACK SURGERY     CYST REMOVAL TRUNK     Family History  Problem Relation Age of Onset   Other Father        carotid artery disease   Heart disease Brother    Alcohol abuse Brother    Breast cancer Neg Hx    Social History   Socioeconomic History   Marital status: Widowed    Spouse name: Not on file   Number of children: Not on file   Years of education: Not on file   Highest education level: Not on file  Occupational History   Not on file  Tobacco Use   Smoking status: Never   Smokeless tobacco: Never  Substance and Sexual Activity   Alcohol use: Yes    Comment: socially   Drug use: No   Sexual activity: Not on file  Other Topics Concern   Not on file  Social History Narrative   Not currently exercising   Social Determinants of Health   Financial Resource Strain: Low Risk    Difficulty of Paying Living Expenses: Not hard at all  Food Insecurity: Not on file  Transportation Needs: No Transportation Needs   Lack of Transportation (Medical): No   Lack of Transportation (Non-Medical): No  Physical Activity: Inactive   Days of Exercise per Week: 0 days   Minutes of Exercise per Session: 0 min  Stress: No Stress Concern Present   Feeling of Stress : Not at all  Social Connections: Moderately Integrated   Frequency of Communication with Friends and Family: Twice a week   Frequency of Social  Gatherings with Friends and Family: Twice a week   Attends Religious Services: More than 4 times per year   Active Member of [employer] or Organizations: Yes   Attends Archivist Meetings: 1 to 4 times per year   Marital Status: Widowed    Tobacco Counseling Counseling given: Not Answered   Clinical Intake:  Pre-visit preparation completed: Yes  Pain : No/denies pain     Nutritional Risks: None Diabetes: No  How often do you need to have someone help you when you read instructions, pamphlets, or other written materials from your doctor or pharmacy?: 1 - Never What is the last grade level you completed in school?: college  Diabetic?no   Interpreter Needed?: No  Information entered by :: [employer] of Daily Living In your present state of health, do you have any difficulty performing the following activities: 02/21/2021  Hearing? N  Vision? N  Difficulty concentrating or making decisions? N  Walking or climbing stairs? N  Dressing or bathing? N  Doing errands, shopping? N  Preparing Food and eating ? N  Using the Toilet? N  In the past six months, have you accidently leaked urine? N  Do you have problems with loss of bowel control? N  Managing your Medications? N  Managing your Finances? N  Housekeeping or managing your Housekeeping? N  Some recent data might be hidden    Patient Care Team: [name], MD as PCP - General (Internal Medicine)  Indicate any recent Medical Services you may have received from other than [facility] providers in the past year (date may be approximate).     Assessment:   This is a routine wellness examination for [place].  Hearing/Vision screen Vision Screening - Comments:: Annual eye exams wear glasses   Dietary issues and exercise activities discussed: Current Exercise Habits: The patient does not participate in regular exercise at present   Goals Addressed             This Visit's Progress    Patient  Stated   On track    Get back to walking and being active.       Depression Screen PHQ 2/9 Scores 02/21/2021 02/21/2021 12/15/2019 03/29/2017 01/14/2016  PHQ - 2 Score 0 0 0 0 0  PHQ- 9 Score - - - 4 -    Fall Risk Fall Risk  02/21/2021 12/15/2019 02/08/2019 08/24/2018 03/29/2017  Falls in the past year? 0 1 0 1 Yes  Number falls in past yr: 0 0 0 0 2 or more  Injury with Fall? 0 0 - - -  Risk for fall due to : - No Fall Risks - - -  Follow up Falls evaluation completed Falls evaluation completed - - -    FALL RISK PREVENTION PERTAINING TO THE HOME:  Any stairs in or around the home? Yes  If so, are there any without handrails? No  Home free of loose throw rugs in walkways, pet beds, electrical cords, etc? Yes  Adequate lighting in your home to reduce risk of falls? Yes   ASSISTIVE DEVICES UTILIZED TO PREVENT FALLS:  Life alert? No  Use of a cane, walker or w/c? No  Grab bars in the bathroom? No  Shower chair or bench in shower? No  Elevated toilet seat or a handicapped toilet? No    Cognitive Function:  Normal cognitive status assessed by direct observation by this Nurse Health Advisor. No abnormalities found.        Immunizations Immunization History  Administered Date(s) Administered   Influenza, High Dose Seasonal PF 12/06/2016, 11/21/2017, 11/30/2018, 02/07/2020   Influenza-Unspecified 12/08/2015, 12/07/2016   PFIZER(Purple Top)SARS-COV-2 Vaccination 04/14/2019, 05/05/2019, 12/25/2019   Pneumococcal Conjugate-13 06/02/2012   Pneumococcal Polysaccharide-23 01/26/2013   Td 03/10/2011   Zoster Recombinat (Shingrix) 10/08/2016, 05/05/2017    TDAP status: Up to date  Flu Vaccine status: Up to date  Pneumococcal vaccine status: Up to date  Covid-19 vaccine status: Completed vaccines  Qualifies for Shingles Vaccine? Yes   Zostavax completed Yes   Shingrix Completed?: Yes  Screening Tests Health Maintenance  Topic Date Due   COVID-19 Vaccine (4 - Booster for  Pfizer series) 02/19/2020   INFLUENZA VACCINE  10/07/2020   TETANUS/TDAP  03/09/2021   DEXA SCAN  11/28/2021   Pneumonia Vaccine 61+ Years old  Completed   Hepatitis C Screening  Completed  Zoster Vaccines- Shingrix  Completed   HPV VACCINES  Aged Out    Health Maintenance  Health Maintenance Due  Topic Date Due   COVID-19 Vaccine (4 - Booster for Pfizer series) 02/19/2020   INFLUENZA VACCINE  10/07/2020    Colorectal cancer screening: No longer required.   Mammogram status: No longer required due to age.  Bone Density status: Completed 11/29/2019. Results reflect: Bone density results: OSTEOPOROSIS. Repeat every 2 years.  Lung Cancer Screening: (Low Dose CT Chest recommended if Age 2-80 years, 30 pack-year currently smoking OR have quit w/in 15years.) does not qualify.   Lung Cancer Screening Referral: n/a  Additional Screening:  Hepatitis C Screening: does not qualify;   Vision Screening: Recommended annual ophthalmology exams for early detection of glaucoma and other disorders of the eye. Is the patient up to date with their annual eye exam?  Yes  Who is the provider or what is the name of the office in which the patient attends annual eye exams? [facility] opthalmology  If pt is not established with a provider, would they like to be referred to a provider to establish care? No .   Dental Screening: Recommended annual dental exams for proper oral hygiene  Community Resource Referral / Chronic Care Management: CRR required this visit?  No   CCM required this visit?  No      Plan:     I have personally reviewed and noted the following in the patients chart:   Medical and social history Use of alcohol, tobacco or illicit drugs  Current medications and supplements including opioid prescriptions.  Functional ability and status Nutritional status Physical activity Advanced directives List of other physicians Hospitalizations, surgeries, and ER visits in  previous 12 months Vitals Screenings to include cognitive, depression, and falls Referrals and appointments  In addition, I have reviewed and discussed with patient certain preventive protocols, quality metrics, and best practice recommendations. A written personalized care plan for preventive services as well as general preventive health recommendations were provided to patient.     Randel Pigg, LPN   [id number]   Nurse Notes: none

## 2021-02-21 NOTE — Patient Instructions (Signed)
[name] , Thank you for taking time to come for your Medicare Wellness Visit. I appreciate your ongoing commitment to your health goals. Please review the following plan we discussed and let me know if I can assist you in the future.   Screening recommendations/referrals: Colonoscopy: no longer required  Mammogram: no longer required  Bone Density: 11/29/2019 Recommended yearly ophthalmology/optometry visit for glaucoma screening and checkup Recommended yearly dental visit for hygiene and checkup  Vaccinations: Influenza vaccine: completed  Pneumococcal vaccine: completed  Tdap vaccine: 03/10/2011 Shingles vaccine: completed   Advanced directives: will provide copies   Conditions/risks identified: none   Next appointment: 03/13/2021  0830am  Dr.Burns    Preventive Care 28 Years and Older, Female Preventive care refers to lifestyle choices and visits with your health care provider that can promote health and wellness. What does preventive care include? A yearly physical exam. This is also called an annual well check. Dental exams once or twice a year. Routine eye exams. Ask your health care provider how often you should have your eyes checked. Personal lifestyle choices, including: Daily care of your teeth and gums. Regular physical activity. Eating a healthy diet. Avoiding tobacco and drug use. Limiting alcohol use. Practicing safe sex. Taking low-dose aspirin every day. Taking vitamin and mineral supplements as recommended by your health care provider. What happens during an annual well check? The services and screenings done by your health care provider during your annual well check will depend on your age, overall health, lifestyle risk factors, and family history of disease. Counseling  Your health care provider may ask you questions about your: Alcohol use. Tobacco use. Drug use. Emotional well-being. Home and relationship well-being. Sexual  activity. Eating habits. History of falls. Memory and ability to understand (cognition). Work and work Statistician. Reproductive health. Screening  You may have the following tests or measurements: Height, weight, and BMI. Blood pressure. Lipid and cholesterol levels. These may be checked every 5 years, or more frequently if you are over 54 years old. Skin check. Lung cancer screening. You may have this screening every year starting at age 34 if you have a 30-pack-year history of smoking and currently smoke or have quit within the past 15 years. Fecal occult blood test (FOBT) of the stool. You may have this test every year starting at age 16. Flexible sigmoidoscopy or colonoscopy. You may have a sigmoidoscopy every 5 years or a colonoscopy every 10 years starting at age 89. Hepatitis C blood test. Hepatitis B blood test. Sexually transmitted disease (STD) testing. Diabetes screening. This is done by checking your blood sugar (glucose) after you have not eaten for a while (fasting). You may have this done every 1-3 years. Bone density scan. This is done to screen for osteoporosis. You may have this done starting at age 75. Mammogram. This may be done every 1-2 years. Talk to your health care provider about how often you should have regular mammograms. Talk with your health care provider about your test results, treatment options, and if necessary, the need for more tests. Vaccines  Your health care provider may recommend certain vaccines, such as: Influenza vaccine. This is recommended every year. Tetanus, diphtheria, and acellular pertussis (Tdap, Td) vaccine. You may need a Td booster every 10 years. Zoster vaccine. You may need this after age 15. Pneumococcal 13-valent conjugate (PCV13) vaccine. One dose is recommended after age 73. Pneumococcal polysaccharide (PPSV23) vaccine. One dose is recommended after age 22. Talk to your health care provider about  which screenings and vaccines  you need and how often you need them. This information is not intended to replace advice given to you by your health care provider. Make sure you discuss any questions you have with your health care provider. Document Released: 03/22/2015 Document Revised: 11/13/2015 Document Reviewed: 12/25/2014 Elsevier Interactive Patient Education  2017 [place] Prevention in the Home Falls can cause injuries. They can happen to people of all ages. There are many things you can do to make your home safe and to help prevent falls. What can I do on the outside of my home? Regularly fix the edges of walkways and driveways and fix any cracks. Remove anything that might make you trip as you walk through a door, such as a raised step or threshold. Trim any bushes or trees on the path to your home. Use bright outdoor lighting. Clear any walking paths of anything that might make someone trip, such as rocks or tools. Regularly check to see if handrails are loose or broken. Make sure that both sides of any steps have handrails. Any raised decks and porches should have guardrails on the edges. Have any leaves, snow, or ice cleared regularly. Use sand or salt on walking paths during winter. Clean up any spills in your garage right away. This includes oil or grease spills. What can I do in the bathroom? Use night lights. Install grab bars by the toilet and in the tub and shower. Do not use towel bars as grab bars. Use non-skid mats or decals in the tub or shower. If you need to sit down in the shower, use a plastic, non-slip stool. Keep the floor dry. Clean up any water that spills on the floor as soon as it happens. Remove soap buildup in the tub or shower regularly. Attach bath mats securely with double-sided non-slip rug tape. Do not have throw rugs and other things on the floor that can make you trip. What can I do in the bedroom? Use night lights. Make sure that you have a light by your bed that  is easy to reach. Do not use any sheets or blankets that are too big for your bed. They should not hang down onto the floor. Have a firm chair that has side arms. You can use this for support while you get dressed. Do not have throw rugs and other things on the floor that can make you trip. What can I do in the kitchen? Clean up any spills right away. Avoid walking on wet floors. Keep items that you use a lot in easy-to-reach places. If you need to reach something above you, use a strong step stool that has a grab bar. Keep electrical cords out of the way. Do not use floor polish or wax that makes floors slippery. If you must use wax, use non-skid floor wax. Do not have throw rugs and other things on the floor that can make you trip. What can I do with my stairs? Do not leave any items on the stairs. Make sure that there are handrails on both sides of the stairs and use them. Fix handrails that are broken or loose. Make sure that handrails are as long as the stairways. Check any carpeting to make sure that it is firmly attached to the stairs. Fix any carpet that is loose or worn. Avoid having throw rugs at the top or bottom of the stairs. If you do have throw rugs, attach them to the floor with  carpet tape. Make sure that you have a light switch at the top of the stairs and the bottom of the stairs. If you do not have them, ask someone to add them for you. What else can I do to help prevent falls? Wear shoes that: Do not have high heels. Have rubber bottoms. Are comfortable and fit you well. Are closed at the toe. Do not wear sandals. If you use a stepladder: Make sure that it is fully opened. Do not climb a closed stepladder. Make sure that both sides of the stepladder are locked into place. Ask someone to hold it for you, if possible. Clearly mark and make sure that you can see: Any grab bars or handrails. First and last steps. Where the edge of each step is. Use tools that help you  move around (mobility aids) if they are needed. These include: Canes. Walkers. Scooters. Crutches. Turn on the lights when you go into a dark area. Replace any light bulbs as soon as they burn out. Set up your furniture so you have a clear path. Avoid moving your furniture around. If any of your floors are uneven, fix them. If there are any pets around you, be aware of where they are. Review your medicines with your doctor. Some medicines can make you feel dizzy. This can increase your chance of falling. Ask your doctor what other things that you can do to help prevent falls. This information is not intended to replace advice given to you by your health care provider. Make sure you discuss any questions you have with your health care provider. Document Released: 12/20/2008 Document Revised: 08/01/2015 Document Reviewed: 03/30/2014 Elsevier Interactive Patient Education  2017 [employer].

## 2021-03-05 ENCOUNTER — Other Ambulatory Visit (HOSPITAL_BASED_OUTPATIENT_CLINIC_OR_DEPARTMENT_OTHER): Payer: Self-pay

## 2021-03-06 ENCOUNTER — Other Ambulatory Visit: Payer: Self-pay | Admitting: Internal Medicine

## 2021-03-12 NOTE — Patient Instructions (Addendum)
°  Blood work was ordered.     Medications changes include : none    Your prescription(s) have been submitted to your pharmacy. Please take as directed and contact our office if you believe you are having problem(s) with the medication(s).   A referral was ordered for Dr Collene Mares.       Someone from their office will call you to schedule an appointment.    Please followup in 3 months

## 2021-03-12 NOTE — Progress Notes (Signed)
Subjective:    Patient ID: Tonya [name], female    DOB: [birth year], 81 y.o.   MRN: [id number]  This visit occurred during the SARS-CoV-2 public health emergency.  Safety protocols were in place, including screening questions prior to the visit, additional usage of staff PPE, and extensive cleaning of exam room while observing appropriate contact time as indicated for disinfecting solutions.     HPI The patient is here for follow up of their chronic medical problems, including chronic lower back pain, anxiety, depression, prediabetes, htn, gerd, hypothyroidism OAB  She is taking all of her medications as prescribed.    Recently has had some dysphagia.- food tends to get stuck - drinking does not really help, but it will eventually go down.  It does cause coughing and is uncomfortable.    She has had some nausea intermittently.    The patient is here for follow up for chronic pain management.  Indication for chronic opioid: chronic LBP Medication and dose: norco 10-325 mg 1-2 tabs bid # pills per month: 120 Last UDS date: 2021 - will do today Pain contract signed (Y/N): Y Date narcotic database last reviewed 03/13/21  Pain assessment:  Pain intensity: 2/10  Amount of pain relief with medication: good Use of pain medications: appropriately taking medications Side effects:  no Sleep:  fairly good - but sleeping more than she should in the morning Mood:  controlled with cymbalta Functional activities: continues to be active:  yes  Last took prescribed pain medication:  this morning Any concerning Alcohol, Street Drug use: no   Medications and allergies reviewed with patient and updated if appropriate.  Patient Active Problem List   Diagnosis Date Noted   COVID 12/16/2020   Chest pain 09/13/2020   Urinary frequency 10/18/2018   Thrombocytopenia (HCC) 10/04/2018   Nausea vomiting and diarrhea    Arthralgia 08/24/2018   Hypertension 08/23/2018   DOE (dyspnea on  exertion) 01/07/2018   BRBPR (bright red blood per rectum) 01/07/2018   Acute pain of right shoulder 01/07/2018   Pain management contract signed, 01/07/18 01/07/2018   Osteoarthritis of hand 07/05/2017   Prediabetes 09/21/2016   Encounter for chronic pain management 05/27/2016   Chronic lower back pain 01/15/2016   Osteopenia, high FRAX 01/14/2016   DDD (degenerative disc disease), cervical S/P Decompressive laminectomy and Fusion 11/15/2012   Asthma 11/15/2012   Hypothyroidism 11/15/2012   Depression 11/15/2012   Constipation 11/15/2012   Overactive bladder 11/15/2012   Anxiety 11/15/2012   Cervical spinal stenosis 10/24/2012   Fracture of cervical vertebra ([place]) 08/31/2012   Scoliosis 07/05/2012   Degeneration of intervertebral disc of lumbosacral region 07/04/2012   GERD (gastroesophageal reflux disease) 06/02/2007    Current Outpatient Medications on File Prior to Visit  Medication Sig Dispense Refill   amLODipine (NORVASC) 2.5 MG tablet TAKE 1 TABLET BY MOUTH EVERY DAY 90 tablet 0   budesonide-formoterol (SYMBICORT) 80-4.5 MCG/ACT inhaler TAKE 2 PUFFS BY MOUTH TWICE A DAY 10.2 each 10   Calcium Citrate 250 MG TABS Take 1 tablet by mouth daily.      cycloSPORINE (RESTASIS) 0.05 % ophthalmic emulsion Place 1 drop into both eyes 2 (two) times daily.     dimenhyDRINATE (DRAMAMINE) 50 MG tablet Take 50 mg by mouth every 8 (eight) hours as needed for nausea.     diphenhydrAMINE HCl (BENADRYL ALLERGY PO) Take 1 tablet by mouth daily as needed (sleep).      DULoxetine (CYMBALTA) 60 MG  capsule Take 1 capsule (60 mg total) by mouth daily. 90 capsule 1   famotidine (PEPCID) 40 MG tablet TAKE 1 TABLET BY MOUTH EVERY DAY 90 tablet 1   HYDROcodone-acetaminophen (NORCO) 10-325 MG tablet Take 2 tablets by mouth 2 (two) times daily. For chronic lower back and neck pain 120 tablet 0   influenza vaccine adjuvanted (FLUAD) 0.5 ML injection Inject into the muscle. 0.5 mL 0   levalbuterol (XOPENEX  HFA) 45 MCG/ACT inhaler Inhale 1-2 puffs into the lungs every 4 (four) hours as needed for wheezing. 1 each 8   nebivolol (BYSTOLIC) 2.5 MG tablet TAKE 1 TABLET BY MOUTH EVERY DAY 90 tablet 1   omeprazole (PRILOSEC) 20 MG capsule TAKE 1 CAPSULE BY MOUTH DAILY AS NEEDED. 90 capsule 1   oxybutynin (DITROPAN-XL) 10 MG 24 hr tablet TAKE 1 TABLET BY MOUTH EVERYDAY AT BEDTIME 90 tablet 1   SYNTHROID 75 MCG tablet Take 1 tablet (75 mcg total) by mouth daily before breakfast. 90 tablet 1   telmisartan (MICARDIS) 40 MG tablet TAKE 1 TABLET BY MOUTH EVERY DAY 90 tablet 1   No current facility-administered medications on file prior to visit.    Past Medical History:  Diagnosis Date   Anemia    Asthma    Back pain, chronic    Diverticulosis    H/O arthrodesis 11/22/2012   History of hysterectomy    Hypertension    Spinal stenosis    Synovial cyst     Past Surgical History:  Procedure Laterality Date   ABDOMINAL HYSTERECTOMY     BACK SURGERY     CYST REMOVAL TRUNK      Social History   Socioeconomic History   Marital status: Widowed    Spouse name: Not on file   Number of children: Not on file   Years of education: Not on file   Highest education level: Not on file  Occupational History   Not on file  Tobacco Use   Smoking status: Never   Smokeless tobacco: Never  Substance and Sexual Activity   Alcohol use: Yes    Comment: socially   Drug use: No   Sexual activity: Not on file  Other Topics Concern   Not on file  Social History Narrative   Not currently exercising   Social Determinants of Health   Financial Resource Strain: Low Risk    Difficulty of Paying Living Expenses: Not hard at all  Food Insecurity: Not on file  Transportation Needs: No Transportation Needs   Lack of Transportation (Medical): No   Lack of Transportation (Non-Medical): No  Physical Activity: Inactive   Days of Exercise per Week: 0 days   Minutes of Exercise per Session: 0 min  Stress: No  Stress Concern Present   Feeling of Stress : Not at all  Social Connections: Moderately Integrated   Frequency of Communication with Friends and Family: Twice a week   Frequency of Social Gatherings with Friends and Family: Twice a week   Attends Religious Services: More than 4 times per year   Active Member of [employer] or Organizations: Yes   Attends Archivist Meetings: 1 to 4 times per year   Marital Status: Widowed    Family History  Problem Relation Age of Onset   Other Father        carotid artery disease   Heart disease Brother    Alcohol abuse Brother    Breast cancer Neg Hx     Review of  Systems  Constitutional:  Negative for fever.  Respiratory:  Positive for shortness of breath (chronic with exertion - unchanged). Negative for cough and wheezing.   Cardiovascular:  Negative for chest pain, palpitations and leg swelling.  Neurological:  Negative for light-headedness and headaches.      Objective:   Vitals:   03/13/21 0839  BP: 130/60  Pulse: 75  Temp: 97.9 F (36.6 C)  SpO2: 96%   BP Readings from Last 3 Encounters:  03/13/21 130/60  09/13/20 126/60  06/14/20 124/78   Wt Readings from Last 3 Encounters:  03/13/21 105 lb 6.4 oz (47.8 kg)  09/13/20 103 lb 12.8 oz (47.1 kg)  06/14/20 102 lb (46.3 kg)   Body mass index is 22.81 kg/m.   Physical Exam    Constitutional: Appears well-developed and well-nourished. No distress.  HENT:  Head: Normocephalic and atraumatic.  Neck: Neck supple. No tracheal deviation present. No thyromegaly present.  No cervical lymphadenopathy Cardiovascular: Normal rate, regular rhythm and normal heart sounds.   No murmur heard. No carotid bruit .  No edema Pulmonary/Chest: Effort normal and breath sounds normal. No respiratory distress. No has no wheezes. No rales.  Skin: Skin is warm and dry. Not diaphoretic.  Psychiatric: Normal mood and affect. Behavior is normal.      Assessment & Plan:    See Problem List  for Assessment and Plan of chronic medical problems.

## 2021-03-13 ENCOUNTER — Ambulatory Visit (INDEPENDENT_AMBULATORY_CARE_PROVIDER_SITE_OTHER): Payer: Medicare Other | Admitting: Internal Medicine

## 2021-03-13 ENCOUNTER — Other Ambulatory Visit: Payer: Self-pay

## 2021-03-13 ENCOUNTER — Encounter: Payer: Self-pay | Admitting: Internal Medicine

## 2021-03-13 VITALS — BP 130/60 | HR 75 | Temp 97.9°F | Ht <= 58 in | Wt 105.4 lb

## 2021-03-13 DIAGNOSIS — K219 Gastro-esophageal reflux disease without esophagitis: Secondary | ICD-10-CM

## 2021-03-13 DIAGNOSIS — R1310 Dysphagia, unspecified: Secondary | ICD-10-CM

## 2021-03-13 DIAGNOSIS — I10 Essential (primary) hypertension: Secondary | ICD-10-CM

## 2021-03-13 DIAGNOSIS — M545 Low back pain, unspecified: Secondary | ICD-10-CM | POA: Diagnosis not present

## 2021-03-13 DIAGNOSIS — R7303 Prediabetes: Secondary | ICD-10-CM | POA: Diagnosis not present

## 2021-03-13 DIAGNOSIS — E039 Hypothyroidism, unspecified: Secondary | ICD-10-CM

## 2021-03-13 DIAGNOSIS — G8929 Other chronic pain: Secondary | ICD-10-CM | POA: Diagnosis not present

## 2021-03-13 DIAGNOSIS — F3289 Other specified depressive episodes: Secondary | ICD-10-CM

## 2021-03-13 DIAGNOSIS — N3281 Overactive bladder: Secondary | ICD-10-CM | POA: Diagnosis not present

## 2021-03-13 DIAGNOSIS — F419 Anxiety disorder, unspecified: Secondary | ICD-10-CM

## 2021-03-13 LAB — COMPREHENSIVE METABOLIC PANEL
ALT: 18 U/L (ref 0–35)
AST: 20 U/L (ref 0–37)
Albumin: 4.1 g/dL (ref 3.5–5.2)
Alkaline Phosphatase: 61 U/L (ref 39–117)
BUN: 23 mg/dL (ref 6–23)
CO2: 28 mEq/L (ref 19–32)
Calcium: 9.6 mg/dL (ref 8.4–10.5)
Chloride: 105 mEq/L (ref 96–112)
Creatinine, Ser: 0.93 mg/dL (ref 0.40–1.20)
GFR: 58.58 mL/min — ABNORMAL LOW (ref >60.00)
Glucose, Bld: 73 mg/dL (ref 70–99)
Potassium: 4.3 mEq/L (ref 3.5–5.1)
Sodium: 140 mEq/L (ref 135–145)
Total Bilirubin: 0.4 mg/dL (ref 0.2–1.2)
Total Protein: 6.7 g/dL (ref 6.0–8.3)

## 2021-03-13 LAB — HEMOGLOBIN A1C: Hgb A1c MFr Bld: 6 % (ref 4.6–6.5)

## 2021-03-13 LAB — TSH: TSH: 0.08 u[IU]/mL — ABNORMAL LOW (ref 0.35–5.50)

## 2021-03-13 NOTE — Assessment & Plan Note (Addendum)
Chronic occ GERD - tums ? Controlled -- she is having dysphagia - she feels her gerd is controlled Continue omeprazole 20 mg daily

## 2021-03-13 NOTE — Assessment & Plan Note (Signed)
Chronic BP well controlled Continue amlodipine 2.5 mg daily, bystolic 2.5 mg daily, telmisartan 40 mg daily cmp

## 2021-03-13 NOTE — Assessment & Plan Note (Signed)
Chronic Controlled, stable Continue oxybutynin 10 mg HS

## 2021-03-13 NOTE — Assessment & Plan Note (Signed)
Acute Started sometime in the past few months With food not drinks Referred to Dr Collene Mares

## 2021-03-13 NOTE — Assessment & Plan Note (Signed)
Chronic Controlled, stable Continue cymbalta 60 mg daily  

## 2021-03-13 NOTE — Assessment & Plan Note (Signed)
Chronic  Clinically euthyroid Currently taking synthroid 75 mcg daily Check tsh  Titrate med dose if needed  

## 2021-03-13 NOTE — Assessment & Plan Note (Signed)
Chronic Overall pain controlled Pain medication helping, well tolerated  She is taking her pain medication appropriately Continue norco 10-325 mg 2 tabs po bid Follow up in 3 months

## 2021-03-13 NOTE — Assessment & Plan Note (Signed)
Chronic lower back pain She is taking pain medication as prescribed and it effective in controlling her pain No side effect [place] controlled substance database checked.   Pain contract signed today UDS today

## 2021-03-13 NOTE — Assessment & Plan Note (Signed)
Chronic Check a1c Low sugar / carb diet Stressed regular exercise  

## 2021-03-15 ENCOUNTER — Other Ambulatory Visit: Payer: Self-pay | Admitting: Internal Medicine

## 2021-03-15 MED ORDER — SYNTHROID 75 MCG PO TABS: 75.0000 ug | ORAL_TABLET | Freq: Every day | ORAL | 1 refills | Status: DC

## 2021-03-17 LAB — DRUG MONITOR, OPIATES,W/CONF, URINE
Codeine: NEGATIVE ng/mL (ref <50)
Hydrocodone: 6935 ng/mL — ABNORMAL HIGH (ref <50)
Hydromorphone: 751 ng/mL — ABNORMAL HIGH (ref <50)
Morphine: NEGATIVE ng/mL (ref <50)
Norhydrocodone: 6803 ng/mL — ABNORMAL HIGH (ref <50)
Opiates: POSITIVE ng/mL — AB (ref <100)

## 2021-03-17 LAB — PRESCRIBED DRUGS,MEDMATCH(R)

## 2021-03-17 LAB — DRUG MONITOR, TRAMADOL,QN, URINE
Desmethyltramadol: NEGATIVE ng/mL (ref <100)
Tramadol: NEGATIVE ng/mL (ref <100)

## 2021-03-17 LAB — DRUG MONITOR,BARBITURATE,W/CONF, URINE: Barbiturates: NEGATIVE ng/mL (ref <300)

## 2021-03-17 LAB — DM TEMPLATE

## 2021-03-17 LAB — DRUG MONITOR, BENZO,W/CONF, URINE: Benzodiazepines: NEGATIVE ng/mL (ref <100)

## 2021-03-17 LAB — DRUG MONITOR, OXYCODONE,W/CONF, URINE: Oxycodone: NEGATIVE ng/mL (ref <100)

## 2021-03-17 LAB — DRUG MONITOR,AMPHETAMINE,W/CONF, URINE: Amphetamines: NEGATIVE ng/mL (ref <500)

## 2021-03-17 LAB — DRUG MONITOR, COCAINEMETAB, W/CONF, URINE: Cocaine Metabolite: NEGATIVE ng/mL (ref <150)

## 2021-03-20 ENCOUNTER — Other Ambulatory Visit (HOSPITAL_BASED_OUTPATIENT_CLINIC_OR_DEPARTMENT_OTHER): Payer: Self-pay

## 2021-03-20 ENCOUNTER — Ambulatory Visit: Payer: Medicare Other | Attending: Internal Medicine

## 2021-03-20 DIAGNOSIS — Z23 Encounter for immunization: Secondary | ICD-10-CM

## 2021-03-20 MED ORDER — PFIZER COVID-19 VAC BIVALENT 30 MCG/0.3ML IM SUSP
INTRAMUSCULAR | 0 refills | Status: DC
  Filled 2021-03-20: qty 0.3, 1d supply, fill #0

## 2021-03-20 NOTE — Progress Notes (Signed)
° °  Covid-19 Vaccination Clinic  Name:  Tonya [name]    MRN: [id number] DOB: 01-14-1941  03/20/2021  [name] was observed post Covid-19 immunization for 15 minutes without incident. She was provided with Vaccine Information Sheet and instruction to access the V-Safe system.   [name] was instructed to call 911 with any severe reactions post vaccine: Difficulty breathing  Swelling of face and throat  A fast heartbeat  A bad rash all over body  Dizziness and weakness   Immunizations Administered     Name Date Dose VIS Date Route   Pfizer Covid-19 Vaccine Bivalent Booster 03/20/2021  3:11 PM 0.3 mL 11/06/2020 Intramuscular   Manufacturer: [place]   Lot: [id number]   [place]: [phone]

## 2021-03-21 ENCOUNTER — Encounter: Payer: Self-pay | Admitting: Internal Medicine

## 2021-03-24 ENCOUNTER — Telehealth: Payer: Self-pay | Admitting: Internal Medicine

## 2021-03-24 DIAGNOSIS — G8929 Other chronic pain: Secondary | ICD-10-CM

## 2021-03-24 DIAGNOSIS — M5440 Lumbago with sciatica, unspecified side: Secondary | ICD-10-CM

## 2021-03-24 DIAGNOSIS — M4802 Spinal stenosis, cervical region: Secondary | ICD-10-CM

## 2021-03-24 MED ORDER — HYDROCODONE-ACETAMINOPHEN 10-325 MG PO TABS: 2.0000 | ORAL_TABLET | Freq: Two times a day (BID) | ORAL | 0 refills | Status: DC

## 2021-03-24 NOTE — Telephone Encounter (Signed)
1.Medication Requested: HYDROcodone-acetaminophen (NORCO) 10-325 MG tablet  2. Pharmacy (Name, Street, [place]): [facility], [place]  Phone:  [phone] Fax:  [phone]   3. On Med List: yes  4. Last Visit with PCP: 01.05.23  5. Next visit date with PCP: 04.07.23   Agent: Please be advised that RX refills may take up to 3 business days. We ask that you follow-up with your pharmacy.

## 2021-03-29 ENCOUNTER — Other Ambulatory Visit: Payer: Self-pay | Admitting: Internal Medicine

## 2021-03-31 NOTE — Telephone Encounter (Signed)
Patient calling in about med SYNTHROID 75 MCG tablet  Says she went to pick it up from pharmacy & they advised her they couldn't fill it due to no refills  Patient is requesting new rx be sent to pharmacy  [facility], [place]  Phone:  [phone] Fax:  [phone]

## 2021-04-01 ENCOUNTER — Other Ambulatory Visit: Payer: Self-pay

## 2021-04-01 MED ORDER — SYNTHROID 75 MCG PO TABS: 75.0000 ug | ORAL_TABLET | Freq: Every day | ORAL | 1 refills | Status: DC

## 2021-04-13 ENCOUNTER — Other Ambulatory Visit: Payer: Self-pay | Admitting: Internal Medicine

## 2021-04-17 ENCOUNTER — Other Ambulatory Visit: Payer: Self-pay | Admitting: Gastroenterology

## 2021-04-17 DIAGNOSIS — R1310 Dysphagia, unspecified: Secondary | ICD-10-CM

## 2021-04-17 DIAGNOSIS — K5900 Constipation, unspecified: Secondary | ICD-10-CM | POA: Diagnosis not present

## 2021-04-17 DIAGNOSIS — K5730 Diverticulosis of large intestine without perforation or abscess without bleeding: Secondary | ICD-10-CM | POA: Diagnosis not present

## 2021-04-17 DIAGNOSIS — K219 Gastro-esophageal reflux disease without esophagitis: Secondary | ICD-10-CM | POA: Diagnosis not present

## 2021-04-24 ENCOUNTER — Telehealth: Payer: Self-pay | Admitting: Internal Medicine

## 2021-04-24 DIAGNOSIS — M4802 Spinal stenosis, cervical region: Secondary | ICD-10-CM

## 2021-04-24 DIAGNOSIS — G8929 Other chronic pain: Secondary | ICD-10-CM

## 2021-04-24 MED ORDER — HYDROCODONE-ACETAMINOPHEN 10-325 MG PO TABS: 2.0000 | ORAL_TABLET | Freq: Two times a day (BID) | ORAL | 0 refills | Status: DC

## 2021-04-24 NOTE — Telephone Encounter (Signed)
1.Medication Requested: HYDROcodone-acetaminophen (NORCO) 10-325 MG tablet  2. Pharmacy (Name, Street, [place]): [facility] [place]  3. On Med List: yes  4. Last Visit with PCP: 03-13-21  5. Next visit date with PCP: 06-13-21   Agent: Please be advised that RX refills may take up to 3 business days. We ask that you follow-up with your pharmacy.

## 2021-04-28 DIAGNOSIS — H4323 Crystalline deposits in vitreous body, bilateral: Secondary | ICD-10-CM | POA: Diagnosis not present

## 2021-04-28 DIAGNOSIS — H5211 Myopia, right eye: Secondary | ICD-10-CM | POA: Diagnosis not present

## 2021-05-21 ENCOUNTER — Ambulatory Visit
Admission: RE | Admit: 2021-05-21 | Discharge: 2021-05-21 | Disposition: A | Discharge status: Home / Self Care | Payer: Medicare Other | Source: Ambulatory Visit | Attending: Gastroenterology | Admitting: Gastroenterology

## 2021-05-21 DIAGNOSIS — R1310 Dysphagia, unspecified: Secondary | ICD-10-CM | POA: Diagnosis not present

## 2021-05-23 ENCOUNTER — Telehealth: Payer: Self-pay | Admitting: Internal Medicine

## 2021-05-23 DIAGNOSIS — M4802 Spinal stenosis, cervical region: Secondary | ICD-10-CM

## 2021-05-23 DIAGNOSIS — G8929 Other chronic pain: Secondary | ICD-10-CM

## 2021-05-23 NOTE — Telephone Encounter (Signed)
1.Medication Requested: HYDROcodone-acetaminophen (NORCO) 10-325 MG tablet ? ?2. Pharmacy (Name, Street, [place]): [facility], [place] ? ?3. On Med List: Y ? ?4. Last Visit with PCP: 03-13-2021 ? ?5. Next visit date with PCP: 06-20-2021 ? ? ?Agent: Please be advised that RX refills may take up to 3 business days. We ask that you follow-up with your pharmacy.  ?

## 2021-05-24 MED ORDER — HYDROCODONE-ACETAMINOPHEN 10-325 MG PO TABS: 2.0000 | ORAL_TABLET | Freq: Two times a day (BID) | ORAL | 0 refills | Status: DC

## 2021-06-13 ENCOUNTER — Encounter: Payer: Medicare Other | Admitting: Internal Medicine

## 2021-06-19 ENCOUNTER — Encounter: Payer: Self-pay | Admitting: Internal Medicine

## 2021-06-19 NOTE — Patient Instructions (Addendum)
? ? ? ?Blood work was ordered.   ? ? ? ?Medications changes include :   none ? ? ? ?Return in about 3 months (around 09/19/2021) for follow up. ? ? ? ?Health Maintenance, Female ?Adopting a healthy lifestyle and getting preventive care are important in promoting health and wellness. Ask your health care provider about: ?The right schedule for you to have regular tests and exams. ?Things you can do on your own to prevent diseases and keep yourself healthy. ?What should I know about diet, weight, and exercise? ?Eat a healthy diet ? ?Eat a diet that includes plenty of vegetables, fruits, low-fat dairy products, and lean protein. ?Do not eat a lot of foods that are high in solid fats, added sugars, or sodium. ?Maintain a healthy weight ?Body mass index (BMI) is used to identify weight problems. It estimates body fat based on height and weight. Your health care provider can help determine your BMI and help you achieve or maintain a healthy weight. ?Get regular exercise ?Get regular exercise. This is one of the most important things you can do for your health. Most adults should: ?Exercise for at least 150 minutes each week. The exercise should increase your heart rate and make you sweat (moderate-intensity exercise). ?Do strengthening exercises at least twice a week. This is in addition to the moderate-intensity exercise. ?Spend less time sitting. Even light physical activity can be beneficial. ?Watch cholesterol and blood lipids ?Have your blood tested for lipids and cholesterol at 80 years of age, then have this test every 5 years. ?Have your cholesterol levels checked more often if: ?Your lipid or cholesterol levels are high. ?You are older than 80 years of age. ?You are at high risk for heart disease. ?What should I know about cancer screening? ?Depending on your health history and family history, you may need to have cancer screening at various ages. This may include screening for: ?Breast cancer. ?Cervical  cancer. ?Colorectal cancer. ?Skin cancer. ?Lung cancer. ?What should I know about heart disease, diabetes, and high blood pressure? ?Blood pressure and heart disease ?High blood pressure causes heart disease and increases the risk of stroke. This is more likely to develop in people who have high blood pressure readings or are overweight. ?Have your blood pressure checked: ?Every 3-5 years if you are 69-41 years of age. ?Every year if you are 29 years old or older. ?Diabetes ?Have regular diabetes screenings. This checks your fasting blood sugar level. Have the screening done: ?Once every three years after age 70 if you are at a normal weight and have a low risk for diabetes. ?More often and at a younger age if you are overweight or have a high risk for diabetes. ?What should I know about preventing infection? ?Hepatitis B ?If you have a higher risk for hepatitis B, you should be screened for this virus. Talk with your health care provider to find out if you are at risk for hepatitis B infection. ?Hepatitis C ?Testing is recommended for: ?Everyone born from [birth year] through 1964. ?Anyone with known risk factors for hepatitis C. ?Sexually transmitted infections (STIs) ?Get screened for STIs, including gonorrhea and chlamydia, if: ?You are sexually active and are younger than 80 years of age. ?You are older than 80 years of age and your health care provider tells you that you are at risk for this type of infection. ?Your sexual activity has changed since you were last screened, and you are at increased risk for chlamydia or gonorrhea. Ask your  health care provider if you are at risk. ?Ask your health care provider about whether you are at high risk for HIV. Your health care provider may recommend a prescription medicine to help prevent HIV infection. If you choose to take medicine to prevent HIV, you should first get tested for HIV. You should then be tested every 3 months for as long as you are taking the  medicine. ?Pregnancy ?If you are about to stop having your period (premenopausal) and you may become pregnant, seek counseling before you get pregnant. ?Take 400 to 800 micrograms (mcg) of folic acid every day if you become pregnant. ?Ask for birth control (contraception) if you want to prevent pregnancy. ?Osteoporosis and menopause ?Osteoporosis is a disease in which the bones lose minerals and strength with aging. This can result in bone fractures. If you are 58 years old or older, or if you are at risk for osteoporosis and fractures, ask your health care provider if you should: ?Be screened for bone loss. ?Take a calcium or vitamin D supplement to lower your risk of fractures. ?Be given hormone replacement therapy (HRT) to treat symptoms of menopause. ?Follow these instructions at home: ?Alcohol use ?Do not drink alcohol if: ?Your health care provider tells you not to drink. ?You are pregnant, may be pregnant, or are planning to become pregnant. ?If you drink alcohol: ?Limit how much you have to: ?0-1 drink a day. ?Know how much alcohol is in your drink. In the [place] one drink equals one 12 oz bottle of beer (355 mL), one 5 oz glass of wine (148 mL), or one 1? oz glass of hard liquor (44 mL). ?Lifestyle ?Do not use any products that contain nicotine or tobacco. These products include cigarettes, chewing tobacco, and vaping devices, such as e-cigarettes. If you need help quitting, ask your health care provider. ?Do not use street drugs. ?Do not share needles. ?Ask your health care provider for help if you need support or information about quitting drugs. ?General instructions ?Schedule regular health, dental, and eye exams. ?Stay current with your vaccines. ?Tell your health care provider if: ?You often feel depressed. ?You have ever been abused or do not feel safe at home. ?Summary ?Adopting a healthy lifestyle and getting preventive care are important in promoting health and wellness. ?Follow your health care  provider's instructions about healthy diet, exercising, and getting tested or screened for diseases. ?Follow your health care provider's instructions on monitoring your cholesterol and blood pressure. ?This information is not intended to replace advice given to you by your health care provider. Make sure you discuss any questions you have with your health care provider. ?Document Revised: 07/15/2020 Document Reviewed: 07/15/2020 ?Elsevier Patient Education ? [place]. ? ?

## 2021-06-19 NOTE — Progress Notes (Signed)
? ? ?Subjective:  ? ? Patient ID: Tonya [name], female    DOB: 1940/04/19, 81 y.o.   MRN: [id number] ? ? ?This visit occurred during the SARS-CoV-2 public health emergency.  Safety protocols were in place, including screening questions prior to the visit, additional usage of staff PPE, and extensive cleaning of exam room while observing appropriate contact time as indicated for disinfecting solutions. ? ? ? ?HPI ?Tonya [name] is here for  ?Chief Complaint  ?Patient presents with  ? Annual Exam  ? ? ?Can asleep until 1 pm in the afternoon.  She likes sleeping a lot and is most concerned about if this is normal or not.  She used to be more active prior to [place], but does not go out as much.  She does go out on occasion and she does enjoy it.  She is just is happy to stay at home.  She denies depression.  She did lose her dog.  She does plan on getting another dog at some point in the near future. ? ? ?Medications and allergies reviewed with patient and updated if appropriate. ? ? ? ?Current Outpatient Medications on File Prior to Visit  ?Medication Sig Dispense Refill  ? amLODipine (NORVASC) 2.5 MG tablet TAKE 1 TABLET BY MOUTH EVERY DAY 90 tablet 0  ? budesonide-formoterol (SYMBICORT) 80-4.5 MCG/ACT inhaler TAKE 2 PUFFS BY MOUTH TWICE A DAY 10.2 each 10  ? Calcium Citrate 250 MG TABS Take 1 tablet by mouth daily.     ? cycloSPORINE (RESTASIS) 0.05 % ophthalmic emulsion Place 1 drop into both eyes 2 (two) times daily.    ? dimenhyDRINATE (DRAMAMINE) 50 MG tablet Take 50 mg by mouth every 8 (eight) hours as needed for nausea.    ? diphenhydrAMINE HCl (BENADRYL ALLERGY PO) Take 1 tablet by mouth daily as needed (sleep).     ? DULoxetine (CYMBALTA) 60 MG capsule TAKE 1 CAPSULE BY MOUTH EVERY DAY 90 capsule 1  ? famotidine (PEPCID) 40 MG tablet TAKE 1 TABLET BY MOUTH EVERY DAY 90 tablet 1  ? HYDROcodone-acetaminophen (NORCO) 10-325 MG tablet Take 2 tablets by mouth 2 (two) times daily. For chronic lower back and neck pain  120 tablet 0  ? levalbuterol (XOPENEX HFA) 45 MCG/ACT inhaler Inhale 1-2 puffs into the lungs every 4 (four) hours as needed for wheezing. 1 each 8  ? nebivolol (BYSTOLIC) 2.5 MG tablet TAKE 1 TABLET BY MOUTH EVERY DAY 90 tablet 1  ? omeprazole (PRILOSEC) 20 MG capsule TAKE 1 CAPSULE BY MOUTH DAILY AS NEEDED. 90 capsule 1  ? oxybutynin (DITROPAN-XL) 10 MG 24 hr tablet TAKE 1 TABLET BY MOUTH EVERYDAY AT BEDTIME 90 tablet 1  ? SYNTHROID 75 MCG tablet Take 1 tablet (75 mcg total) by mouth daily before breakfast. One day a week take 1/2 tab 90 tablet 1  ? telmisartan (MICARDIS) 40 MG tablet TAKE 1 TABLET BY MOUTH EVERY DAY 90 tablet 1  ? ?No current facility-administered medications on file prior to visit.  ? ? ?Review of Systems  ?Constitutional:  Negative for fever.  ?HENT:  Positive for trouble swallowing (occ - has seen Dr Collene Mares).   ?Eyes:  Negative for visual disturbance.  ?Respiratory:  Negative for cough, shortness of breath and wheezing.   ?Cardiovascular:  Positive for chest pain (with trouble swallowing). Negative for palpitations and leg swelling.  ?Gastrointestinal:  Negative for abdominal pain, blood in stool, constipation, diarrhea and nausea.  ?Genitourinary:  Negative for dysuria.  ?Musculoskeletal:  Positive  for arthralgias and back pain.  ?Skin:  Negative for rash.  ?Neurological:  Negative for light-headedness and headaches.  ?Psychiatric/Behavioral:  Negative for dysphoric mood. The patient is not nervous/anxious.   ? ?   ?Objective:  ? ?Vitals:  ? 06/20/21 1403  ?BP: 136/80  ?Pulse: 78  ?Temp: 98.2 ?F (36.8 ?C)  ?SpO2: 98%  ? ?Filed Weights  ? 06/20/21 1403  ?Weight: 104 lb 6.4 oz (47.4 kg)  ? ?Body mass index is 22.59 kg/m?. ? ?BP Readings from Last 3 Encounters:  ?06/20/21 136/80  ?03/13/21 130/60  ?09/13/20 126/60  ? ? ?Wt Readings from Last 3 Encounters:  ?06/20/21 104 lb 6.4 oz (47.4 kg)  ?03/13/21 105 lb 6.4 oz (47.8 kg)  ?09/13/20 103 lb 12.8 oz (47.1 kg)  ? ? ? ?  02/21/2021  ?  1:22 PM  02/21/2021  ?  1:17 PM 12/15/2019  ? 10:01 AM 03/29/2017  ? 11:36 AM 01/14/2016  ? 10:25 AM  ?Depression screen PHQ 2/9  ?Decreased Interest 0 0 0 0 0  ?Down, Depressed, Hopeless 0 0 0 0 0  ?PHQ - 2 Score 0 0 0 0 0  ?Altered sleeping    3   ?Tired, decreased energy    0   ?Change in appetite    0   ?Feeling bad or failure about yourself     0   ?Trouble concentrating    1   ?Moving slowly or fidgety/restless    0   ?Suicidal thoughts    0   ?PHQ-9 Score    4   ? ? ? ?   ? View : No data to display.  ?  ?  ?  ? ? ? ? ?  ?Physical Exam ?Constitutional: She appears well-developed and well-nourished. No distress.  ?HENT:  ?Head: Normocephalic and atraumatic.  ?Right Ear: External ear normal. Normal ear canal and TM ?Left Ear: External ear normal.  Normal ear canal and TM ?Mouth/Throat: Oropharynx is clear and moist.  ?Eyes: Conjunctivae and EOM are normal.  ?Neck: Neck supple. No tracheal deviation present. No thyromegaly present.  ?No carotid bruit  ?Cardiovascular: Normal rate, regular rhythm and normal heart sounds.   ?No murmur heard.  No edema. ?Pulmonary/Chest: Effort normal and breath sounds normal. No respiratory distress. She has no wheezes. She has no rales.  ?Breast: deferred   ?Abdominal: Soft. She exhibits no distension. There is no tenderness.  ?Lymphadenopathy: She has no cervical adenopathy.  ?Skin: Skin is warm and dry. She is not diaphoretic.  ?Psychiatric: She has a normal mood and affect. Her behavior is normal.  ? ? ? ?Lab Results  ?Component Value Date  ? WBC 9.0 03/15/2020  ? HGB 11.9 (L) 03/15/2020  ? HCT 36.7 03/15/2020  ? PLT 205.0 03/15/2020  ? GLUCOSE 73 03/13/2021  ? CHOL 208 (H) 03/15/2020  ? TRIG 120.0 03/15/2020  ? HDL 75.80 03/15/2020  ? LDLCALC 108 (H) 03/15/2020  ? ALT 18 03/13/2021  ? AST 20 03/13/2021  ? NA 140 03/13/2021  ? K 4.3 03/13/2021  ? CL 105 03/13/2021  ? CREATININE 0.93 03/13/2021  ? BUN 23 03/13/2021  ? CO2 28 03/13/2021  ? TSH 0.08 (L) 03/13/2021  ? HGBA1C 6.0 03/13/2021  ?  MICROALBUR 1.2 03/15/2020  ? ? ? ? ?   ?Assessment & Plan:  ? ?Physical exam: ?Screening blood work  ordered ?Exercise  not regular-stressed regular exercise ?Weight  normal ?Substance abuse  none ? ? ?Reviewed recommended  immunizations. ? ? ?Health Maintenance  ?Topic Date Due  ? TETANUS/TDAP  03/13/2022 (Originally 03/09/2021)  ? INFLUENZA VACCINE  10/07/2021  ? DEXA SCAN  11/28/2021  ? Pneumonia Vaccine 90+ Years old  Completed  ? COVID-19 Vaccine  Completed  ? Hepatitis C Screening  Completed  ? Zoster Vaccines- Shingrix  Completed  ? HPV VACCINES  Aged Out  ?  ? ? ? ? ? ? ?See Problem List for Assessment and Plan of chronic medical problems. ? ? ? ? ?

## 2021-06-20 ENCOUNTER — Ambulatory Visit (INDEPENDENT_AMBULATORY_CARE_PROVIDER_SITE_OTHER): Payer: Medicare Other | Admitting: Internal Medicine

## 2021-06-20 VITALS — BP 136/80 | HR 78 | Temp 98.2°F | Ht <= 58 in | Wt 104.4 lb

## 2021-06-20 DIAGNOSIS — G8929 Other chronic pain: Secondary | ICD-10-CM | POA: Diagnosis not present

## 2021-06-20 DIAGNOSIS — Z0000 Encounter for general adult medical examination without abnormal findings: Secondary | ICD-10-CM

## 2021-06-20 DIAGNOSIS — N3281 Overactive bladder: Secondary | ICD-10-CM | POA: Diagnosis not present

## 2021-06-20 DIAGNOSIS — M545 Low back pain, unspecified: Secondary | ICD-10-CM | POA: Diagnosis not present

## 2021-06-20 DIAGNOSIS — F419 Anxiety disorder, unspecified: Secondary | ICD-10-CM

## 2021-06-20 DIAGNOSIS — K219 Gastro-esophageal reflux disease without esophagitis: Secondary | ICD-10-CM | POA: Diagnosis not present

## 2021-06-20 DIAGNOSIS — R7303 Prediabetes: Secondary | ICD-10-CM | POA: Diagnosis not present

## 2021-06-20 DIAGNOSIS — F3289 Other specified depressive episodes: Secondary | ICD-10-CM

## 2021-06-20 DIAGNOSIS — M8589 Other specified disorders of bone density and structure, multiple sites: Secondary | ICD-10-CM | POA: Diagnosis not present

## 2021-06-20 DIAGNOSIS — J4530 Mild persistent asthma, uncomplicated: Secondary | ICD-10-CM

## 2021-06-20 DIAGNOSIS — E039 Hypothyroidism, unspecified: Secondary | ICD-10-CM

## 2021-06-20 DIAGNOSIS — I10 Essential (primary) hypertension: Secondary | ICD-10-CM | POA: Diagnosis not present

## 2021-06-20 DIAGNOSIS — R1310 Dysphagia, unspecified: Secondary | ICD-10-CM

## 2021-06-20 LAB — CBC WITH DIFFERENTIAL/PLATELET
Basophils Absolute: 0 10*3/uL (ref 0.0–0.1)
Basophils Relative: 0.5 % (ref 0.0–3.0)
Eosinophils Absolute: 0.2 10*3/uL (ref 0.0–0.7)
Eosinophils Relative: 1.9 % (ref 0.0–5.0)
HCT: 35.3 % — ABNORMAL LOW (ref 36.0–46.0)
Hemoglobin: 11.7 g/dL — ABNORMAL LOW (ref 12.0–15.0)
Lymphocytes Relative: 18.9 % (ref 12.0–46.0)
Lymphs Abs: 1.7 10*3/uL (ref 0.7–4.0)
MCHC: 33.1 g/dL (ref 30.0–36.0)
MCV: 93.4 fl (ref 78.0–100.0)
Monocytes Absolute: 0.6 10*3/uL (ref 0.1–1.0)
Monocytes Relative: 7.2 % (ref 3.0–12.0)
Neutro Abs: 6.3 10*3/uL (ref 1.4–7.7)
Neutrophils Relative %: 71.5 % (ref 43.0–77.0)
Platelets: 188 10*3/uL (ref 150.0–400.0)
RBC: 3.78 Mil/uL — ABNORMAL LOW (ref 3.87–5.11)
RDW: 14 % (ref 11.5–15.5)
WBC: 8.8 10*3/uL (ref 4.0–10.5)

## 2021-06-20 LAB — COMPREHENSIVE METABOLIC PANEL
ALT: 17 U/L (ref 0–35)
AST: 20 U/L (ref 0–37)
Albumin: 4.3 g/dL (ref 3.5–5.2)
Alkaline Phosphatase: 64 U/L (ref 39–117)
BUN: 29 mg/dL — ABNORMAL HIGH (ref 6–23)
CO2: 29 mEq/L (ref 19–32)
Calcium: 9.5 mg/dL (ref 8.4–10.5)
Chloride: 104 mEq/L (ref 96–112)
Creatinine, Ser: 0.89 mg/dL (ref 0.40–1.20)
GFR: 61.64 mL/min (ref >60.00)
Glucose, Bld: 61 mg/dL — ABNORMAL LOW (ref 70–99)
Potassium: 4.3 mEq/L (ref 3.5–5.1)
Sodium: 139 mEq/L (ref 135–145)
Total Bilirubin: 0.6 mg/dL (ref 0.2–1.2)
Total Protein: 6.7 g/dL (ref 6.0–8.3)

## 2021-06-20 LAB — LIPID PANEL
Cholesterol: 199 mg/dL (ref 0–200)
HDL: 86.2 mg/dL (ref >39.00)
LDL Cholesterol: 102 mg/dL — ABNORMAL HIGH (ref 0–99)
NonHDL: 112.47
Total CHOL/HDL Ratio: 2
Triglycerides: 54 mg/dL (ref 0.0–149.0)
VLDL: 10.8 mg/dL (ref 0.0–40.0)

## 2021-06-20 LAB — VITAMIN D 25 HYDROXY (VIT D DEFICIENCY, FRACTURES): VITD: 31.85 ng/mL (ref 30.00–100.00)

## 2021-06-20 LAB — HEMOGLOBIN A1C: Hgb A1c MFr Bld: 6.1 % (ref 4.6–6.5)

## 2021-06-20 LAB — TSH: TSH: 0.32 u[IU]/mL — ABNORMAL LOW (ref 0.35–5.50)

## 2021-06-20 MED ORDER — OMEPRAZOLE 40 MG PO CPDR: 40.0000 mg | DELAYED_RELEASE_CAPSULE | Freq: Every day | ORAL | 3 refills | Status: DC

## 2021-06-20 NOTE — Assessment & Plan Note (Signed)
Chronic ?She is taking pain medication for chronic back pain ?He is taking the pain medication appropriately and the medication is providing her relief of the pain without side effects ?Continue current pain regimen Norco 10-325 mg 2 tabs twice daily as needed ?

## 2021-06-20 NOTE — Assessment & Plan Note (Signed)
Chronic ?Controlled, stable ?Mild, persistent ?Continue symbicort bid, xopenex as needed ? ?

## 2021-06-20 NOTE — Assessment & Plan Note (Signed)
Chronic  ?Clinically euthyroid ?Currently taking synthroid 75 mcg daily 6 days a week, 37.5 mcg once a week ?Check tsh  ?Titrate med dose if needed ? ?

## 2021-06-20 NOTE — Assessment & Plan Note (Signed)
Chronic Controlled, Stable Continue duloxetine 60 mg daily 

## 2021-06-20 NOTE — Assessment & Plan Note (Signed)
Chronic ?Adequately controlled ?Continue oxybutynin 10 mg daily ?

## 2021-06-20 NOTE — Assessment & Plan Note (Signed)
Chronic ?BP well controlled ?Continue amlodipine 2.5 mg qd, bystolic 2.5 mg qd, telmisartan 40 mg daily ?cmp ? ?

## 2021-06-20 NOTE — Assessment & Plan Note (Signed)
Chronic ?Esophagram 04/23/2021 showed significant esophageal motility disorder with diffuse spasm ?Following with Dr. Collene Mares ?

## 2021-06-20 NOTE — Assessment & Plan Note (Addendum)
Chronic ?GERD controlled ?Continue omeprazole 40 mg daily prn ? ?

## 2021-06-20 NOTE — Assessment & Plan Note (Signed)
Chronic Check a1c Low sugar / carb diet Stressed regular exercise  

## 2021-06-20 NOTE — Assessment & Plan Note (Signed)
Chronic ?Stressed regular exercise-she will try to start exercising regularly ?DEXA due later this year-we will rediscuss treatment after next DEXA scan ?Continue calcium, vitamin D ?Check vitamin D level ?

## 2021-06-20 NOTE — Assessment & Plan Note (Signed)
Chronic ?Overall pain controlled ?Pain medication is helping and she is taking medication appropriately ?Continue Norco 10-325 mg 2 tabs twice daily ?Follow-up in 3 months ?

## 2021-06-21 ENCOUNTER — Other Ambulatory Visit: Payer: Self-pay | Admitting: Internal Medicine

## 2021-06-21 MED ORDER — SYNTHROID 75 MCG PO TABS: 75.0000 ug | ORAL_TABLET | Freq: Every day | ORAL | 1 refills | Status: DC

## 2021-07-02 ENCOUNTER — Telehealth: Payer: Self-pay

## 2021-07-02 DIAGNOSIS — G8929 Other chronic pain: Secondary | ICD-10-CM

## 2021-07-02 DIAGNOSIS — M4802 Spinal stenosis, cervical region: Secondary | ICD-10-CM

## 2021-07-02 MED ORDER — HYDROCODONE-ACETAMINOPHEN 10-325 MG PO TABS: 2.0000 | ORAL_TABLET | Freq: Two times a day (BID) | ORAL | 0 refills | Status: DC

## 2021-07-02 NOTE — Telephone Encounter (Signed)
Pt calling to request refill on: ?HYDROcodone-acetaminophen (NORCO) 10-325 MG tablet ? ?Pharmacy: ?[facility] [place] - [id number] ? ?LOV 06/20/21 ?ROV 09/23/21 ? ?

## 2021-08-05 ENCOUNTER — Telehealth: Payer: Self-pay | Admitting: Internal Medicine

## 2021-08-05 DIAGNOSIS — G8929 Other chronic pain: Secondary | ICD-10-CM

## 2021-08-05 DIAGNOSIS — M4802 Spinal stenosis, cervical region: Secondary | ICD-10-CM

## 2021-08-05 NOTE — Telephone Encounter (Signed)
1.Medication Requested: HYDROcodone-acetaminophen (NORCO) 10-325 MG tablet 2. Pharmacy (Name, Street, [place]): [facility], [place] Phone:  [phone]  Fax:  [phone]     3. On Med List: yes  4. Last Visit with PCP:  5. Next visit date with PCP:   Agent: Please be advised that RX refills may take up to 3 business days. We ask that you follow-up with your pharmacy.

## 2021-08-06 MED ORDER — HYDROCODONE-ACETAMINOPHEN 10-325 MG PO TABS: 2.0000 | ORAL_TABLET | Freq: Two times a day (BID) | ORAL | 0 refills | Status: DC | PRN

## 2021-08-06 MED ORDER — HYDROCODONE-ACETAMINOPHEN 10-325 MG PO TABS: 2.0000 | ORAL_TABLET | Freq: Two times a day (BID) | ORAL | 0 refills | Status: DC

## 2021-09-08 ENCOUNTER — Other Ambulatory Visit: Payer: Self-pay

## 2021-09-08 DIAGNOSIS — M545 Low back pain, unspecified: Secondary | ICD-10-CM

## 2021-09-08 MED ORDER — HYDROCODONE-ACETAMINOPHEN 10-325 MG PO TABS: 2.0000 | ORAL_TABLET | Freq: Two times a day (BID) | ORAL | 0 refills | Status: DC | PRN

## 2021-09-08 NOTE — Telephone Encounter (Signed)
Pt is requesting a refill on: HYDROcodone-acetaminophen (NORCO) 10-325 MG tablet  Ph[facility]acy: [facility] [place] [address]  LOV 06/20/21 ROV 09/23/21

## 2021-09-11 NOTE — Telephone Encounter (Signed)
Pharmacey does not have the medication.   Please resend the HYDROcodone-acetaminophen (Ec[place]) 10-325 MG tablet RX to:  [id number] [place] Phone:  [phone]  Fax:  [phone]

## 2021-09-12 ENCOUNTER — Other Ambulatory Visit: Payer: Self-pay | Admitting: Internal Medicine

## 2021-09-12 DIAGNOSIS — M545 Low back pain, unspecified: Secondary | ICD-10-CM

## 2021-09-12 MED ORDER — HYDROCODONE-ACETAMINOPHEN 10-325 MG PO TABS: 2.0000 | ORAL_TABLET | Freq: Two times a day (BID) | ORAL | 0 refills | Status: DC | PRN

## 2021-09-22 ENCOUNTER — Encounter: Payer: Self-pay | Admitting: Internal Medicine

## 2021-09-22 DIAGNOSIS — D649 Anemia, unspecified: Secondary | ICD-10-CM | POA: Insufficient documentation

## 2021-09-22 NOTE — Patient Instructions (Addendum)
     Blood work was ordered.  Have a chest xray done today.   Medications changes include :   decrease duloxetine to 30 mg daily and start mirtazapine 7.5 mg nightly   Your prescription(s) have been sent to your pharmacy.      Return in about 3 months (around 12/24/2021) for chronic pain management, follow up.

## 2021-09-22 NOTE — Progress Notes (Unsigned)
Subjective:    Patient ID: Tonya [name], female    DOB: 11/16/1940, 81 y.o.   MRN: [id number]     HPI Tonya [name] is here for follow up of her chronic medical problems, including chronic lower back pain, anxiety, depression, prediabetes, htn, gerd, hypothyroidism, OAB   Sleeping a lot during the day.  Has a dog now - it may be a permanent.    Sometimes eating she has epigastric pain.    Not keeping up with friends as she should  Not eating like she should.  She cooks for a friend 3 days a week.  Has lost a little weight.  Typically runs 100-105.  Indication for chronic opioid: chronic LBP Medication and dose: Norco 10-325 mg 1-2 tabs twice daily # pills per month: 120 Last UDS date: 03/2021 Pain contract signed (Y/N): Yes Date narcotic database last reviewed 09/23/2021  Pain assessment:  Pain intensity: 2/10  Amount of pain relief with medication: Good Use of pain medications: appropriately taking medications Side effects:  no Sleep: Not great secondary to nocturia-sleeping a lot during the day Mood:?  Depression controlled or not Functional activities: continues to be active:  yes  Last took prescribed pain medication: Today Any concerning Alcohol, Street Drug use: no     Medications and allergies reviewed with patient and updated if appropriate.  Current Outpatient Medications on File Prior to Visit  Medication Sig Dispense Refill   amLODipine (NORVASC) 2.5 MG tablet TAKE 1 TABLET BY MOUTH EVERY DAY 90 tablet 0   budesonide-formoterol (SYMBICORT) 80-4.5 MCG/ACT inhaler TAKE 2 PUFFS BY MOUTH TWICE A DAY 10.2 each 10   Calcium Citrate 250 MG TABS Take 1 tablet by mouth daily.      cycloSPORINE (RESTASIS) 0.05 % ophthalmic emulsion Place 1 drop into both eyes 2 (two) times daily.     dimenhyDRINATE (DRAMAMINE) 50 MG tablet Take 50 mg by mouth every 8 (eight) hours as needed for nausea.     diphenhydrAMINE HCl (BENADRYL ALLERGY PO) Take 1 tablet by mouth daily as  needed (sleep).      DULoxetine (CYMBALTA) 60 MG capsule TAKE 1 CAPSULE BY MOUTH EVERY DAY 90 capsule 1   HYDROcodone-acetaminophen (NORCO) 10-325 MG tablet Take 2 tablets by mouth 2 (two) times daily as needed (chronic lower back and neck pain). 120 tablet 0   levalbuterol (XOPENEX HFA) 45 MCG/ACT inhaler Inhale 1-2 puffs into the lungs every 4 (four) hours as needed for wheezing. 1 each 8   nebivolol (BYSTOLIC) 2.5 MG tablet TAKE 1 TABLET BY MOUTH EVERY DAY 90 tablet 1   omeprazole (PRILOSEC) 40 MG capsule Take 1 capsule (40 mg total) by mouth daily. 90 capsule 3   oxybutynin (DITROPAN-XL) 10 MG 24 hr tablet TAKE 1 TABLET BY MOUTH EVERYDAY AT BEDTIME 90 tablet 1   SYNTHROID 75 MCG tablet Take 1 tablet (75 mcg total) by mouth daily before breakfast. Take 6 days a week only 90 tablet 1   telmisartan (MICARDIS) 40 MG tablet TAKE 1 TABLET BY MOUTH EVERY DAY 90 tablet 1   No current facility-administered medications on file prior to visit.     Review of Systems  Constitutional:  Negative for fever.  HENT:  Positive for trouble swallowing.   Respiratory:  Positive for shortness of breath (chronic - ? at baseline). Negative for cough and wheezing.   Cardiovascular:  Negative for chest pain, palpitations and leg swelling.  Gastrointestinal:  Positive for abdominal pain (epigastric sometimes  with eating) and constipation (mild). Negative for blood in stool (no black stool), diarrhea, nausea and vomiting.  Musculoskeletal:  Positive for back pain (right side of back).  Neurological:  Positive for light-headedness (at times). Negative for headaches.       Balance is not good       Objective:   Vitals:   09/23/21 1456  BP: 118/72  Pulse: 80  Temp: 97.9 F (36.6 C)  SpO2: 97%   BP Readings from Last 3 Encounters:  09/23/21 118/72  06/20/21 136/80  03/13/21 130/60   Wt Readings from Last 3 Encounters:  09/23/21 95 lb 9.6 oz (43.4 kg)  06/20/21 104 lb 6.4 oz (47.4 kg)  03/13/21 105 lb  6.4 oz (47.8 kg)   Body mass index is 21.43 kg/m.    Physical Exam Constitutional:      General: She is not in acute distress.    Appearance: Normal appearance.  HENT:     Head: Normocephalic and atraumatic.  Eyes:     Conjunctiva/sclera: Conjunctivae normal.  Cardiovascular:     Rate and Rhythm: Normal rate and regular rhythm.     Heart sounds: Normal heart sounds. No murmur heard. Pulmonary:     Effort: Pulmonary effort is normal. No respiratory distress.     Breath sounds: Normal breath sounds. No wheezing.  Abdominal:     General: There is no distension.     Palpations: Abdomen is soft.     Tenderness: There is no abdominal tenderness. There is no guarding or rebound.  Musculoskeletal:     Cervical back: Neck supple.     Right lower leg: No edema.     Left lower leg: No edema.  Lymphadenopathy:     Cervical: No cervical adenopathy.  Skin:    General: Skin is warm and dry.     Findings: No rash.  Neurological:     Mental Status: She is alert. Mental status is at baseline.  Psychiatric:        Mood and Affect: Mood normal.        Behavior: Behavior normal.        Lab Results  Component Value Date   WBC 8.8 06/20/2021   HGB 11.7 (L) 06/20/2021   HCT 35.3 (L) 06/20/2021   PLT 188.0 06/20/2021   GLUCOSE 61 (L) 06/20/2021   CHOL 199 06/20/2021   TRIG 54.0 06/20/2021   HDL 86.20 06/20/2021   LDLCALC 102 (H) 06/20/2021   ALT 17 06/20/2021   AST 20 06/20/2021   NA 139 06/20/2021   K 4.3 06/20/2021   CL 104 06/20/2021   CREATININE 0.89 06/20/2021   BUN 29 (H) 06/20/2021   CO2 29 06/20/2021   TSH 0.32 (L) 06/20/2021   HGBA1C 6.1 06/20/2021   MICROALBUR 1.2 03/15/2020     Assessment & Plan:    See Problem List for Assessment and Plan of chronic medical problems.

## 2021-09-23 ENCOUNTER — Ambulatory Visit (INDEPENDENT_AMBULATORY_CARE_PROVIDER_SITE_OTHER): Payer: Medicare Other | Admitting: Internal Medicine

## 2021-09-23 ENCOUNTER — Ambulatory Visit (INDEPENDENT_AMBULATORY_CARE_PROVIDER_SITE_OTHER): Payer: Medicare Other

## 2021-09-23 VITALS — BP 118/72 | HR 80 | Temp 97.9°F | Ht <= 58 in | Wt 95.6 lb

## 2021-09-23 DIAGNOSIS — I10 Essential (primary) hypertension: Secondary | ICD-10-CM

## 2021-09-23 DIAGNOSIS — R0609 Other forms of dyspnea: Secondary | ICD-10-CM

## 2021-09-23 DIAGNOSIS — G8929 Other chronic pain: Secondary | ICD-10-CM

## 2021-09-23 DIAGNOSIS — R5383 Other fatigue: Secondary | ICD-10-CM

## 2021-09-23 DIAGNOSIS — R0602 Shortness of breath: Secondary | ICD-10-CM | POA: Diagnosis not present

## 2021-09-23 DIAGNOSIS — D649 Anemia, unspecified: Secondary | ICD-10-CM | POA: Diagnosis not present

## 2021-09-23 DIAGNOSIS — M545 Low back pain, unspecified: Secondary | ICD-10-CM | POA: Diagnosis not present

## 2021-09-23 DIAGNOSIS — R7303 Prediabetes: Secondary | ICD-10-CM | POA: Diagnosis not present

## 2021-09-23 DIAGNOSIS — F3289 Other specified depressive episodes: Secondary | ICD-10-CM

## 2021-09-23 DIAGNOSIS — E039 Hypothyroidism, unspecified: Secondary | ICD-10-CM | POA: Diagnosis not present

## 2021-09-23 DIAGNOSIS — R634 Abnormal weight loss: Secondary | ICD-10-CM | POA: Insufficient documentation

## 2021-09-23 DIAGNOSIS — F419 Anxiety disorder, unspecified: Secondary | ICD-10-CM

## 2021-09-23 DIAGNOSIS — N3281 Overactive bladder: Secondary | ICD-10-CM

## 2021-09-23 DIAGNOSIS — K219 Gastro-esophageal reflux disease without esophagitis: Secondary | ICD-10-CM | POA: Diagnosis not present

## 2021-09-23 LAB — COMPREHENSIVE METABOLIC PANEL
ALT: 14 U/L (ref 0–35)
AST: 19 U/L (ref 0–37)
Albumin: 4.6 g/dL (ref 3.5–5.2)
Alkaline Phosphatase: 65 U/L (ref 39–117)
BUN: 35 mg/dL — ABNORMAL HIGH (ref 6–23)
CO2: 26 mEq/L (ref 19–32)
Calcium: 9.8 mg/dL (ref 8.4–10.5)
Chloride: 103 mEq/L (ref 96–112)
Creatinine, Ser: 1.09 mg/dL (ref 0.40–1.20)
GFR: 48.24 mL/min — ABNORMAL LOW (ref >60.00)
Glucose, Bld: 102 mg/dL — ABNORMAL HIGH (ref 70–99)
Potassium: 4.9 mEq/L (ref 3.5–5.1)
Sodium: 137 mEq/L (ref 135–145)
Total Bilirubin: 0.4 mg/dL (ref 0.2–1.2)
Total Protein: 7.4 g/dL (ref 6.0–8.3)

## 2021-09-23 LAB — IBC PANEL
Iron: 98 ug/dL (ref 42–145)
Saturation Ratios: 24.4 % (ref 20.0–50.0)
TIBC: 401.8 ug/dL (ref 250.0–450.0)
Transferrin: 287 mg/dL (ref 212.0–360.0)

## 2021-09-23 LAB — CBC WITH DIFFERENTIAL/PLATELET
Basophils Absolute: 0.1 10*3/uL (ref 0.0–0.1)
Basophils Relative: 0.7 % (ref 0.0–3.0)
Eosinophils Absolute: 0.2 10*3/uL (ref 0.0–0.7)
Eosinophils Relative: 2.4 % (ref 0.0–5.0)
HCT: 36.6 % (ref 36.0–46.0)
Hemoglobin: 12 g/dL (ref 12.0–15.0)
Lymphocytes Relative: 25 % (ref 12.0–46.0)
Lymphs Abs: 2 10*3/uL (ref 0.7–4.0)
MCHC: 32.9 g/dL (ref 30.0–36.0)
MCV: 92.8 fl (ref 78.0–100.0)
Monocytes Absolute: 0.7 10*3/uL (ref 0.1–1.0)
Monocytes Relative: 9.1 % (ref 3.0–12.0)
Neutro Abs: 4.9 10*3/uL (ref 1.4–7.7)
Neutrophils Relative %: 62.8 % (ref 43.0–77.0)
Platelets: 226 10*3/uL (ref 150.0–400.0)
RBC: 3.94 Mil/uL (ref 3.87–5.11)
RDW: 13.8 % (ref 11.5–15.5)
WBC: 7.8 10*3/uL (ref 4.0–10.5)

## 2021-09-23 LAB — TSH: TSH: 0.1 u[IU]/mL — ABNORMAL LOW (ref 0.35–5.50)

## 2021-09-23 LAB — HEMOGLOBIN A1C: Hgb A1c MFr Bld: 6.2 % (ref 4.6–6.5)

## 2021-09-23 LAB — FERRITIN: Ferritin: 49.2 ng/mL (ref 10.0–291.0)

## 2021-09-23 MED ORDER — MIRTAZAPINE 7.5 MG PO TABS: 7.5000 mg | ORAL_TABLET | Freq: Every day | ORAL | 3 refills | Status: DC

## 2021-09-23 MED ORDER — DULOXETINE HCL 30 MG PO CPEP: 30.0000 mg | ORAL_CAPSULE | Freq: Every day | ORAL | 3 refills | Status: DC

## 2021-09-23 NOTE — Assessment & Plan Note (Signed)
Chronic She feels anxiety is controlled We will adjust medication for questionable depression controlled or not Decrease duloxetine to 30 mg daily Start Remeron 7.5 mg bedtime

## 2021-09-23 NOTE — Assessment & Plan Note (Signed)
Chronic Improved but not ideally controlled Continue oxybutynin XL 10 mg daily

## 2021-09-23 NOTE — Assessment & Plan Note (Signed)
Chronic BP well controlled Continue telmisartan 40 mg daily and bystolic 2.5 mg daily Discontinue amlodipine 2.5 mg daily - likely not taking cmp

## 2021-09-23 NOTE — Assessment & Plan Note (Signed)
Chronic ?  Controlled or not-sent for depression screening suggest depression another does not She does sleep a lot and does not always feel like socializing or keeping up with friends as much as she should Does not always feel like cooking or doing certain things We will decrease duloxetine to 30 mg daily and consider discontinuing at her next visit Start Remeron 7.5 mg nightly Follow-up 3 months, sooner if needed

## 2021-09-23 NOTE — Assessment & Plan Note (Signed)
Acute She has always been on the light side, but has lost few pounds since she was here last that seems to be related to not eating as much and disinterest in fixing food ?  Depressed are not Check blood work today including CBC, iron levels, TSH, CMP Will adjust depression medication to see if that helps-decrease duloxetine to 30 mg daily and start Remeron 7.5 mg nightly She will monitor her weight at home Advised working on eating more regularly Follow-up in 3 months, sooner if needed

## 2021-09-23 NOTE — Assessment & Plan Note (Signed)
Chronic Pain controlled Pain medication is helping and she is taking pain medication appropriately Continue Norco 10-325 mg twice daily UDS up-to-date Follow-up in 3 months

## 2021-09-23 NOTE — Assessment & Plan Note (Signed)
Chronic  Clinically euthyroid Currently taking Synthroid every 75 mcg 6 days a week Check tsh  Titrate med dose if needed

## 2021-09-23 NOTE — Assessment & Plan Note (Signed)
Chronic GERD controlled Continue omeprazole 40 mg daily 

## 2021-09-23 NOTE — Assessment & Plan Note (Signed)
Chronic Check a1c Low sugar / carb diet Stressed regular exercise  

## 2021-09-23 NOTE — Assessment & Plan Note (Signed)
History of anemia Check CBC, iron panel 

## 2021-09-23 NOTE — Assessment & Plan Note (Signed)
Chronic Not necessarily worse Possibly related to deconditioning We will check chest x-ray Reevaluate in 3 months

## 2021-09-25 MED ORDER — SYNTHROID 50 MCG PO TABS: 50.0000 ug | ORAL_TABLET | Freq: Every day | ORAL | 1 refills | Status: DC

## 2021-09-25 NOTE — Addendum Note (Signed)
Addended by: [name] on: 09/25/2021 04:16 PM   Modules accepted: Orders

## 2021-09-28 ENCOUNTER — Other Ambulatory Visit: Payer: Self-pay | Admitting: Internal Medicine

## 2021-10-12 ENCOUNTER — Other Ambulatory Visit: Payer: Self-pay | Admitting: Internal Medicine

## 2021-10-16 ENCOUNTER — Telehealth: Payer: Self-pay | Admitting: Internal Medicine

## 2021-10-16 DIAGNOSIS — M545 Low back pain, unspecified: Secondary | ICD-10-CM

## 2021-10-16 MED ORDER — HYDROCODONE-ACETAMINOPHEN 10-325 MG PO TABS: 2.0000 | ORAL_TABLET | Freq: Two times a day (BID) | ORAL | 0 refills | Status: DC | PRN

## 2021-10-16 NOTE — Telephone Encounter (Signed)
Patient needs a refill on her hydrocodone - [facility]

## 2021-10-18 ENCOUNTER — Other Ambulatory Visit: Payer: Self-pay | Admitting: Internal Medicine

## 2021-11-01 ENCOUNTER — Other Ambulatory Visit: Payer: Self-pay | Admitting: Internal Medicine

## 2021-11-04 DIAGNOSIS — L814 Other melanin hyperpigmentation: Secondary | ICD-10-CM | POA: Diagnosis not present

## 2021-11-04 DIAGNOSIS — L608 Other nail disorders: Secondary | ICD-10-CM | POA: Diagnosis not present

## 2021-11-04 DIAGNOSIS — Z85828 Personal history of other malignant neoplasm of skin: Secondary | ICD-10-CM | POA: Diagnosis not present

## 2021-11-04 DIAGNOSIS — L578 Other skin changes due to chronic exposure to nonionizing radiation: Secondary | ICD-10-CM | POA: Diagnosis not present

## 2021-11-04 DIAGNOSIS — D485 Neoplasm of uncertain behavior of skin: Secondary | ICD-10-CM | POA: Diagnosis not present

## 2021-11-04 DIAGNOSIS — D0462 Carcinoma in situ of skin of left upper limb, including shoulder: Secondary | ICD-10-CM | POA: Diagnosis not present

## 2021-11-04 DIAGNOSIS — L570 Actinic keratosis: Secondary | ICD-10-CM | POA: Diagnosis not present

## 2021-11-19 MED ORDER — HYDROCODONE-ACETAMINOPHEN 10-325 MG PO TABS: 2.0000 | ORAL_TABLET | Freq: Two times a day (BID) | ORAL | 0 refills | Status: DC | PRN

## 2021-11-19 NOTE — Telephone Encounter (Signed)
Patient states that medication has not been sent in and she is out of medication.

## 2021-11-19 NOTE — Addendum Note (Signed)
Addended by: [name] on: 11/19/2021 12:58 PM   Modules accepted: Orders

## 2021-11-19 NOTE — Telephone Encounter (Signed)
Sent in

## 2021-12-19 ENCOUNTER — Telehealth: Payer: Self-pay | Admitting: Internal Medicine

## 2021-12-19 NOTE — Telephone Encounter (Signed)
Patient would like to refill her hydrocodone 10 mg.  - [facility] [place] [facility] [place] on [place], [place]

## 2021-12-23 NOTE — Patient Instructions (Addendum)
     Flu immunization administered today.      Blood work was ordered.   The lab is on the first floor.    Medications changes include :   stop mirtazapine.  Start omeprazole 20 mg daily     Return in about 3 months (around 03/26/2022) for follow up.

## 2021-12-23 NOTE — Progress Notes (Unsigned)
Subjective:    Patient ID: Tonya [name], female    DOB: 10-07-1940, 81 y.o.   MRN: [id number]     HPI Tonya [name] is here for follow up of her chronic medical problems, including chronic lower back pain, anxiety, depression, prediabetes, htn, gerd, hypothyroidism, OAD  The patient is here for follow up for chronic pain management.  Indication for chronic opioid: chronic LBP Medication and dose: norco 10-325 mg 1-2 tabs bid # pills per month: 120 Last UDS date: 03/2021 Pain contract signed (Y/N): Y Date narcotic database last reviewed xxx  Pain assessment:  Pain intensity: xxx/10  Amount of pain relief with medication: xxx Use of pain medications: appropriately taking medications Side effects:  no Sleep:  xxxx Mood:  xxxx Functional activities: continues to be active:  yes  Last took prescribed pain medication:  today Any concerning Alcohol, Street Drug use: no   Medications and allergies reviewed with patient and updated if appropriate.  Current Outpatient Medications on File Prior to Visit  Medication Sig Dispense Refill   budesonide-formoterol (SYMBICORT) 80-4.5 MCG/ACT inhaler TAKE 2 PUFFS BY MOUTH TWICE A DAY 10.2 each 10   Calcium Citrate 250 MG TABS Take 1 tablet by mouth daily.      cycloSPORINE (RESTASIS) 0.05 % ophthalmic emulsion Place 1 drop into both eyes 2 (two) times daily.     dimenhyDRINATE (DRAMAMINE) 50 MG tablet Take 50 mg by mouth every 8 (eight) hours as needed for nausea.     DULoxetine (CYMBALTA) 30 MG capsule TAKE 1 CAPSULE BY MOUTH EVERY DAY 90 capsule 2   DULoxetine (CYMBALTA) 60 MG capsule TAKE 1 CAPSULE BY MOUTH EVERY DAY 90 capsule 1   HYDROcodone-acetaminophen (NORCO) 10-325 MG tablet Take 2 tablets by mouth 2 (two) times daily as needed (chronic lower back and neck pain). 120 tablet 0   levalbuterol (XOPENEX HFA) 45 MCG/ACT inhaler Inhale 1-2 puffs into the lungs every 4 (four) hours as needed for wheezing. 1 each 8   mirtazapine  (REMERON) 7.5 MG tablet TAKE 1 TABLET BY MOUTH AT BEDTIME. 90 tablet 2   nebivolol (BYSTOLIC) 2.5 MG tablet TAKE 1 TABLET BY MOUTH EVERY DAY 90 tablet 1   omeprazole (PRILOSEC) 40 MG capsule Take 1 capsule (40 mg total) by mouth daily. 90 capsule 3   oxybutynin (DITROPAN-XL) 10 MG 24 hr tablet TAKE 1 TABLET BY MOUTH EVERYDAY AT BEDTIME 90 tablet 1   SYNTHROID 50 MCG tablet Take 1 tablet (50 mcg total) by mouth daily before breakfast. 90 tablet 1   telmisartan (MICARDIS) 40 MG tablet TAKE 1 TABLET BY MOUTH EVERY DAY 90 tablet 1   No current facility-administered medications on file prior to visit.     Review of Systems     Objective:  There were no vitals filed for this visit. BP Readings from Last 3 Encounters:  09/23/21 118/72  06/20/21 136/80  03/13/21 130/60   Wt Readings from Last 3 Encounters:  09/23/21 95 lb 9.6 oz (43.4 kg)  06/20/21 104 lb 6.4 oz (47.4 kg)  03/13/21 105 lb 6.4 oz (47.8 kg)   There is no height or weight on file to calculate BMI.    Physical Exam     Lab Results  Component Value Date   WBC 7.8 09/23/2021   HGB 12.0 09/23/2021   HCT 36.6 09/23/2021   PLT 226.0 09/23/2021   GLUCOSE 102 (H) 09/23/2021   CHOL 199 06/20/2021   TRIG 54.0 06/20/2021  HDL 86.20 06/20/2021   LDLCALC 102 (H) 06/20/2021   ALT 14 09/23/2021   AST 19 09/23/2021   NA 137 09/23/2021   K 4.9 09/23/2021   CL 103 09/23/2021   CREATININE 1.09 09/23/2021   BUN 35 (H) 09/23/2021   CO2 26 09/23/2021   TSH 0.10 (L) 09/23/2021   HGBA1C 6.2 09/23/2021   MICROALBUR 1.2 03/15/2020     Assessment & Plan:    See Problem List for Assessment and Plan of chronic medical problems.

## 2021-12-24 ENCOUNTER — Ambulatory Visit (INDEPENDENT_AMBULATORY_CARE_PROVIDER_SITE_OTHER): Payer: Medicare Other | Admitting: Internal Medicine

## 2021-12-24 ENCOUNTER — Encounter: Payer: Self-pay | Admitting: Internal Medicine

## 2021-12-24 VITALS — BP 124/80 | HR 90 | Temp 98.0°F | Ht <= 58 in | Wt 99.0 lb

## 2021-12-24 DIAGNOSIS — R7303 Prediabetes: Secondary | ICD-10-CM

## 2021-12-24 DIAGNOSIS — M545 Low back pain, unspecified: Secondary | ICD-10-CM

## 2021-12-24 DIAGNOSIS — N3281 Overactive bladder: Secondary | ICD-10-CM | POA: Diagnosis not present

## 2021-12-24 DIAGNOSIS — M8589 Other specified disorders of bone density and structure, multiple sites: Secondary | ICD-10-CM

## 2021-12-24 DIAGNOSIS — I10 Essential (primary) hypertension: Secondary | ICD-10-CM

## 2021-12-24 DIAGNOSIS — F419 Anxiety disorder, unspecified: Secondary | ICD-10-CM | POA: Diagnosis not present

## 2021-12-24 DIAGNOSIS — K219 Gastro-esophageal reflux disease without esophagitis: Secondary | ICD-10-CM

## 2021-12-24 DIAGNOSIS — G8929 Other chronic pain: Secondary | ICD-10-CM

## 2021-12-24 DIAGNOSIS — E039 Hypothyroidism, unspecified: Secondary | ICD-10-CM | POA: Diagnosis not present

## 2021-12-24 DIAGNOSIS — F3289 Other specified depressive episodes: Secondary | ICD-10-CM

## 2021-12-24 LAB — BASIC METABOLIC PANEL
BUN: 22 mg/dL (ref 6–23)
CO2: 28 mEq/L (ref 19–32)
Calcium: 10.1 mg/dL (ref 8.4–10.5)
Chloride: 108 mEq/L (ref 96–112)
Creatinine, Ser: 0.98 mg/dL (ref 0.40–1.20)
GFR: 54.71 mL/min — ABNORMAL LOW (ref >60.00)
Glucose, Bld: 120 mg/dL — ABNORMAL HIGH (ref 70–99)
Potassium: 4 mEq/L (ref 3.5–5.1)
Sodium: 144 mEq/L (ref 135–145)

## 2021-12-24 LAB — TSH: TSH: 1.57 u[IU]/mL (ref 0.35–5.50)

## 2021-12-24 MED ORDER — HYDROCODONE-ACETAMINOPHEN 10-325 MG PO TABS: 2.0000 | ORAL_TABLET | Freq: Two times a day (BID) | ORAL | 0 refills | Status: DC | PRN

## 2021-12-24 MED ORDER — OMEPRAZOLE 20 MG PO CPDR: 20.0000 mg | DELAYED_RELEASE_CAPSULE | Freq: Every day | ORAL | 3 refills | Status: DC

## 2021-12-24 NOTE — Assessment & Plan Note (Addendum)
Chronic Controlled, Stable Does not like the Remeron-caused vivid dreams-discontinue Continue Cymbalta 30 mg daily-currently depression is controlled we will just monitor on current dose of duloxetine and increase if needed

## 2021-12-24 NOTE — Assessment & Plan Note (Addendum)
Chronic Fairly controlled Does not like the Remeron so we will discontinue Continue duloxetine 30 mg daily-monitor on this and we can increase dose if needed

## 2021-12-24 NOTE — Assessment & Plan Note (Addendum)
Chronic Blood pressure well controlled BMP Continue telmisartan 40 mg daily and Bystolic 2.5 mg daily

## 2021-12-24 NOTE — Assessment & Plan Note (Signed)
Chronic Better with medication, but still has symptoms Continue oxybutynin XL 10 mg daily

## 2021-12-24 NOTE — Assessment & Plan Note (Addendum)
Chronic Not taking the omeprazole - needs rx Having GERD Restart omeprazole but 20 mg daily

## 2021-12-24 NOTE — Assessment & Plan Note (Addendum)
Chronic Pain controlled with current regimen She is taking medications appropriately [place] controlled substance database checked-refill sent to pharmacy Continue Norco 10-325 mg, 1-2 tabs twice daily as needed Follow-up in 3 months

## 2021-12-24 NOTE — Assessment & Plan Note (Signed)
Chronic  Clinically euthyroid Check tsh and will titrate med dose if needed Currently taking Synthroid 50 mcg daily 

## 2021-12-24 NOTE — Assessment & Plan Note (Signed)
Chronic Sugar stable in prediabetic range Continue diabetic diet

## 2021-12-24 NOTE — Assessment & Plan Note (Signed)
Chronic Due for DEXA-ordered

## 2021-12-24 NOTE — Assessment & Plan Note (Signed)
Chronic Chronic back pain is controlled with current pain regimen She is taking medication appropriately [place] controlled substance database checked today Continue current regimen of Norco 10-325 mg, 1-2 tabs twice daily as needed

## 2021-12-30 ENCOUNTER — Encounter: Payer: Self-pay | Admitting: Internal Medicine

## 2021-12-31 DIAGNOSIS — D0462 Carcinoma in situ of skin of left upper limb, including shoulder: Secondary | ICD-10-CM | POA: Diagnosis not present

## 2022-01-01 MED ORDER — OXYCODONE-ACETAMINOPHEN 7.5-325 MG PO TABS: 1.0000 | ORAL_TABLET | Freq: Two times a day (BID) | ORAL | 0 refills | Status: DC | PRN

## 2022-01-01 NOTE — Addendum Note (Signed)
Addended by: [name] on: 01/01/2022 04:40 PM   Modules accepted: Orders

## 2022-01-18 ENCOUNTER — Other Ambulatory Visit: Payer: Self-pay | Admitting: Internal Medicine

## 2022-02-24 ENCOUNTER — Ambulatory Visit (INDEPENDENT_AMBULATORY_CARE_PROVIDER_SITE_OTHER): Payer: Medicare Other

## 2022-02-24 VITALS — Ht <= 58 in | Wt 96.7 lb

## 2022-02-24 DIAGNOSIS — Z1239 Encounter for other screening for malignant neoplasm of breast: Secondary | ICD-10-CM

## 2022-02-24 DIAGNOSIS — Z0000 Encounter for general adult medical examination without abnormal findings: Secondary | ICD-10-CM

## 2022-02-24 NOTE — Patient Instructions (Signed)
[name] , Thank you for taking time to come for your Medicare Wellness Visit. I appreciate your ongoing commitment to your health goals. Please review the following plan we discussed and let me know if I can assist you in the future.   These are the goals we discussed:  Goals      Patient Stated     Get up to 100 pounds.        This is a list of the screening recommended for you and due dates:  Health Maintenance  Topic Date Due   DTaP/Tdap/Td vaccine (2 - Tdap) 03/09/2021   COVID-19 Vaccine (5 - 2023-24 season) 11/07/2021   DEXA scan (bone density measurement)  11/28/2021   Medicare Annual Wellness Visit  02/25/2023   Pneumonia Vaccine  Completed   Flu Shot  Completed   Zoster (Shingles) Vaccine  Completed   HPV Vaccine  Aged Out    Advanced directives: Yes  Conditions/risks identified: Yes  Next appointment: Follow up in one year for your annual wellness visit.   Preventive Care 41 Years and Older, Female Preventive care refers to lifestyle choices and visits with your health care provider that can promote health and wellness. What does preventive care include? A yearly physical exam. This is also called an annual well check. Dental exams once or twice a year. Routine eye exams. Ask your health care provider how often you should have your eyes checked. Personal lifestyle choices, including: Daily care of your teeth and gums. Regular physical activity. Eating a healthy diet. Avoiding tobacco and drug use. Limiting alcohol use. Practicing safe sex. Taking low-dose aspirin every day. Taking vitamin and mineral supplements as recommended by your health care provider. What happens during an annual well check? The services and screenings done by your health care provider during your annual well check will depend on your age, overall health, lifestyle risk factors, and family history of disease. Counseling  Your health care provider may ask you questions about  your: Alcohol use. Tobacco use. Drug use. Emotional well-being. Home and relationship well-being. Sexual activity. Eating habits. History of falls. Memory and ability to understand (cognition). Work and work Statistician. Reproductive health. Screening  You may have the following tests or measurements: Height, weight, and BMI. Blood pressure. Lipid and cholesterol levels. These may be checked every 5 years, or more frequently if you are over 22 years old. Skin check. Lung cancer screening. You may have this screening every year starting at age 66 if you have a 30-pack-year history of smoking and currently smoke or have quit within the past 15 years. Fecal occult blood test (FOBT) of the stool. You may have this test every year starting at age 48. Flexible sigmoidoscopy or colonoscopy. You may have a sigmoidoscopy every 5 years or a colonoscopy every 10 years starting at age 28. Hepatitis C blood test. Hepatitis B blood test. Sexually transmitted disease (STD) testing. Diabetes screening. This is done by checking your blood sugar (glucose) after you have not eaten for a while (fasting). You may have this done every 1-3 years. Bone density scan. This is done to screen for osteoporosis. You may have this done starting at age 74. Mammogram. This may be done every 1-2 years. Talk to your health care provider about how often you should have regular mammograms. Talk with your health care provider about your test results, treatment options, and if necessary, the need for more tests. Vaccines  Your health care provider may recommend certain vaccines, such  as: Influenza vaccine. This is recommended every year. Tetanus, diphtheria, and acellular pertussis (Tdap, Td) vaccine. You may need a Td booster every 10 years. Zoster vaccine. You may need this after age 15. Pneumococcal 13-valent conjugate (PCV13) vaccine. One dose is recommended after age 35. Pneumococcal polysaccharide (PPSV23) vaccine.  One dose is recommended after age 93. Talk to your health care provider about which screenings and vaccines you need and how often you need them. This information is not intended to replace advice given to you by your health care provider. Make sure you discuss any questions you have with your health care provider. Document Released: 03/22/2015 Document Revised: 11/13/2015 Document Reviewed: 12/25/2014 Elsevier Interactive Patient Education  2017 [place] Prevention in the Home Falls can cause injuries. They can happen to people of all ages. There are many things you can do to make your home safe and to help prevent falls. What can I do on the outside of my home? Regularly fix the edges of walkways and driveways and fix any cracks. Remove anything that might make you trip as you walk through a door, such as a raised step or threshold. Trim any bushes or trees on the path to your home. Use bright outdoor lighting. Clear any walking paths of anything that might make someone trip, such as rocks or tools. Regularly check to see if handrails are loose or broken. Make sure that both sides of any steps have handrails. Any raised decks and porches should have guardrails on the edges. Have any leaves, snow, or ice cleared regularly. Use sand or salt on walking paths during winter. Clean up any spills in your garage right away. This includes oil or grease spills. What can I do in the bathroom? Use night lights. Install grab bars by the toilet and in the tub and shower. Do not use towel bars as grab bars. Use non-skid mats or decals in the tub or shower. If you need to sit down in the shower, use a plastic, non-slip stool. Keep the floor dry. Clean up any water that spills on the floor as soon as it happens. Remove soap buildup in the tub or shower regularly. Attach bath mats securely with double-sided non-slip rug tape. Do not have throw rugs and other things on the floor that can make  you trip. What can I do in the bedroom? Use night lights. Make sure that you have a light by your bed that is easy to reach. Do not use any sheets or blankets that are too big for your bed. They should not hang down onto the floor. Have a firm chair that has side arms. You can use this for support while you get dressed. Do not have throw rugs and other things on the floor that can make you trip. What can I do in the kitchen? Clean up any spills right away. Avoid walking on wet floors. Keep items that you use a lot in easy-to-reach places. If you need to reach something above you, use a strong step stool that has a grab bar. Keep electrical cords out of the way. Do not use floor polish or wax that makes floors slippery. If you must use wax, use non-skid floor wax. Do not have throw rugs and other things on the floor that can make you trip. What can I do with my stairs? Do not leave any items on the stairs. Make sure that there are handrails on both sides of the stairs and use  them. Fix handrails that are broken or loose. Make sure that handrails are as long as the stairways. Check any carpeting to make sure that it is firmly attached to the stairs. Fix any carpet that is loose or worn. Avoid having throw rugs at the top or bottom of the stairs. If you do have throw rugs, attach them to the floor with carpet tape. Make sure that you have a light switch at the top of the stairs and the bottom of the stairs. If you do not have them, ask someone to add them for you. What else can I do to help prevent falls? Wear shoes that: Do not have high heels. Have rubber bottoms. Are comfortable and fit you well. Are closed at the toe. Do not wear sandals. If you use a stepladder: Make sure that it is fully opened. Do not climb a closed stepladder. Make sure that both sides of the stepladder are locked into place. Ask someone to hold it for you, if possible. Clearly mark and make sure that you can  see: Any grab bars or handrails. First and last steps. Where the edge of each step is. Use tools that help you move around (mobility aids) if they are needed. These include: Canes. Walkers. Scooters. Crutches. Turn on the lights when you go into a dark area. Replace any light bulbs as soon as they burn out. Set up your furniture so you have a clear path. Avoid moving your furniture around. If any of your floors are uneven, fix them. If there are any pets around you, be aware of where they are. Review your medicines with your doctor. Some medicines can make you feel dizzy. This can increase your chance of falling. Ask your doctor what other things that you can do to help prevent falls. This information is not intended to replace advice given to you by your health care provider. Make sure you discuss any questions you have with your health care provider. Document Released: 12/20/2008 Document Revised: 08/01/2015 Document Reviewed: 03/30/2014 Elsevier Interactive Patient Education  2017 [employer].

## 2022-02-24 NOTE — Progress Notes (Signed)
Virtual Visit via Telephone Note  I connected with  Tonya [name] on 02/24/22 at  1:30 PM EST by telephone and verified that I am speaking with the correct person using two identifiers.  Location: Patient: Home Provider: [place] Persons participating in the virtual visit: [place]   I discussed the limitations, risks, security and privacy concerns of performing an evaluation and management service by telephone and the availability of in person appointments. The patient expressed understanding and agreed to proceed.  Interactive audio and video telecommunications were attempted between this nurse and patient, however failed, due to patient having technical difficulties OR patient did not have access to video capability.  We continued and completed visit with audio only.  Some vital signs may be absent or patient reported.   Sheral Flow, LPN  Subjective:   Tonya [name] is a 81 y.o. female who presents for Medicare Annual (Subsequent) preventive examination.  Review of Systems     Cardiac Risk Factors include: advanced age (>60mn, >>4women);hypertension;dyslipidemia;sedentary lifestyle;family history of premature cardiovascular disease     Objective:    Today's Vitals   02/24/22 1334  Weight: 96 lb 11.2 oz (43.9 kg)  Height: '4\' 8"'$  (1.422 m)  PainSc: 0-No pain   Body mass index is 21.68 kg/m.     02/24/2022    1:37 PM 02/21/2021    1:19 PM 12/15/2019   10:03 AM 10/03/2018    5:01 PM 08/17/2015    3:45 PM  Advanced Directives  Does Patient Have a Medical Advance Directive? Yes Yes Yes Yes Yes  Type of AParamedicof [place] will [place]Living will H[place]Living will [place]Living will;Out of facility DNR (pink MOST or yellow form) [place]Living will  Does patient want to make changes to medical advance  directive?   No - Patient declined No - Patient declined   Copy of HTwilightin Chart? No - copy requested No - copy requested No - copy requested No - copy requested     Current Medications (verified) Outpatient Encounter Medications as of 02/24/2022  Medication Sig   budesonide-formoterol (SYMBICORT) 80-4.5 MCG/ACT inhaler TAKE 2 PUFFS BY MOUTH TWICE A DAY   Calcium Citrate 250 MG TABS Take 1 tablet by mouth daily.    cycloSPORINE (RESTASIS) 0.05 % ophthalmic emulsion Place 1 drop into both eyes 2 (two) times daily.   dimenhyDRINATE (DRAMAMINE) 50 MG tablet Take 50 mg by mouth every 8 (eight) hours as needed for nausea.   DULoxetine (CYMBALTA) 30 MG capsule TAKE 1 CAPSULE BY MOUTH EVERY DAY   HYDROcodone-acetaminophen (NORCO) 10-325 MG tablet Take 2 tablets by mouth 2 (two) times daily as needed (chronic lower back and neck pain).   levalbuterol (XOPENEX HFA) 45 MCG/ACT inhaler Inhale 1-2 puffs into the lungs every 4 (four) hours as needed for wheezing.   nebivolol (BYSTOLIC) 2.5 MG tablet TAKE 1 TABLET BY MOUTH EVERY DAY   omeprazole (PRILOSEC) 20 MG capsule Take 1 capsule (20 mg total) by mouth daily.   oxybutynin (DITROPAN-XL) 10 MG 24 hr tablet TAKE 1 TABLET BY MOUTH EVERYDAY AT BEDTIME   oxyCODONE-acetaminophen (PERCOCET) 7.5-325 MG tablet Take 1-2 tablets by mouth every 12 (twelve) hours as needed for severe pain.   SYNTHROID 50 MCG tablet TAKE 1 TABLET BY MOUTH DAILY BEFORE BREAKFAST   telmisartan (MICARDIS) 40 MG tablet TAKE 1 TABLET BY MOUTH EVERY DAY  triamcinolone cream (KENALOG) 0.1 % Apply topically 2 (two) times daily.   No facility-administered encounter medications on file as of 02/24/2022.    Allergies (verified) Prochlorperazine edisylate and Other   History: Past Medical History:  Diagnosis Date   Anemia    Asthma    Back pain, chronic    Diverticulosis    H/O arthrodesis 11/22/2012   History of hysterectomy    Hypertension    Spinal  stenosis    Synovial cyst    Past Surgical History:  Procedure Laterality Date   ABDOMINAL HYSTERECTOMY     BACK SURGERY     CYST REMOVAL TRUNK     Family History  Problem Relation Age of Onset   Other Father        carotid artery disease   Heart disease Brother    Alcohol abuse Brother    Breast cancer Neg Hx    Social History   Socioeconomic History   Marital status: Widowed    Spouse name: Not on file   Number of children: Not on file   Years of education: Not on file   Highest education level: Not on file  Occupational History   Not on file  Tobacco Use   Smoking status: Never   Smokeless tobacco: Never  Substance and Sexual Activity   Alcohol use: Yes    Comment: socially   Drug use: No   Sexual activity: Not on file  Other Topics Concern   Not on file  Social History Narrative   Not currently exercising   Social Determinants of Health   Financial Resource Strain: Low Risk  (02/24/2022)   Overall Financial Resource Strain (CARDIA)    Difficulty of Paying Living Expenses: Not hard at all  Food Insecurity: No Food Insecurity (02/24/2022)   Hunger Vital Sign    Worried About Running Out of Food in the Last Year: Never true    Ran Out of Food in the Last Year: Never true  Transportation Needs: No Transportation Needs (02/24/2022)   PRAPARE - Hydrologist (Medical): No    Lack of Transportation (Non-Medical): No  Physical Activity: Inactive (02/24/2022)   Exercise Vital Sign    Days of Exercise per Week: 0 days    Minutes of Exercise per Session: 0 min  Stress: No Stress Concern Present (02/24/2022)   [place]    Feeling of Stress : Not at all  Social Connections: Moderately Integrated (02/24/2022)   Social Connection and Isolation Panel [NHANES]    Frequency of Communication with Friends and Family: Twice a week    Frequency of Social Gatherings with Friends  and Family: Twice a week    Attends Religious Services: More than 4 times per year    Active Member of [employer] or Organizations: Yes    Attends Archivist Meetings: 1 to 4 times per year    Marital Status: Widowed    Tobacco Counseling Counseling given: Not Answered   Clinical Intake:  Pre-visit preparation completed: Yes  Pain : No/denies pain Pain Score: 0-No pain     BMI - recorded: 21.68 Nutritional Status: BMI of 19-24  Normal Nutritional Risks: None Diabetes: No  How often do you need to have someone help you when you read instructions, pamphlets, or other written materials from your doctor or pharmacy?: 1 - Never What is the last grade level you completed in school?: HSG  Diabetic? no  Interpreter Needed?: No  Information entered by :: Lisette Abu, LPN.   Activities of Daily Living    02/24/2022    1:46 PM  In your present state of health, do you have any difficulty performing the following activities:  Hearing? 1  Vision? 0  Difficulty concentrating or making decisions? 0  Walking or climbing stairs? 0  Dressing or bathing? 0  Doing errands, shopping? 0  Preparing Food and eating ? N  Using the Toilet? N  In the past six months, have you accidently leaked urine? Y  Do you have problems with loss of bowel control? N  Managing your Medications? N  Managing your Finances? N  Housekeeping or managing your Housekeeping? N    Patient Care Team: [name], MD as PCP - General (Internal Medicine)  Indicate any recent Medical Services you may have received from other than [facility] providers in the past year (date may be approximate).     Assessment:   This is a routine wellness examination for Tonya [name].  Hearing/Vision screen Hearing Screening - Comments:: Patient has hearing difficulties in right ear; no hearing aids.  Vision Screening - Comments:: Cataracts removed - up to date with routine eye exams with Dr. Luberta Mutter   Dietary  issues and exercise activities discussed: Current Exercise Habits: The patient does not participate in regular exercise at present, Exercise limited by: respiratory conditions(s)   Goals Addressed   None   Depression Screen    02/24/2022    1:43 PM 09/23/2021    4:11 PM 02/21/2021    1:22 PM 02/21/2021    1:17 PM 12/15/2019   10:01 AM 03/29/2017   11:36 AM 01/14/2016   10:25 AM  PHQ 2/9 Scores  PHQ - 2 Score 0 2 0 0 0 0 0  PHQ- 9 Score  10    4     Fall Risk    02/24/2022    1:39 PM 09/23/2021    4:12 PM 02/21/2021    1:19 PM 12/15/2019   10:03 AM 02/08/2019    2:06 PM  [place] in the past year? 0 1 0 1 0  Number falls in past yr: 0 1 0 0 0  Injury with Fall? 0 0 0 0   Risk for fall due to : No Fall Risks No Fall Risks  No Fall Risks   Follow up Falls prevention discussed Falls evaluation completed Falls evaluation completed Falls evaluation completed     [place]:  Any stairs in or around the home? Yes  If so, are there any without handrails? No  Home free of loose throw rugs in walkways, pet beds, electrical cords, etc? Yes  Adequate lighting in your home to reduce risk of falls? Yes   ASSISTIVE DEVICES UTILIZED TO PREVENT FALLS:  Life alert? Yes  Use of a cane, walker or w/c? No  Grab bars in the bathroom? Yes  Shower chair or bench in shower? Yes  Elevated toilet seat or a handicapped toilet? Yes   TIMED UP AND GO:  Was the test performed? No . Phone Visit  Cognitive Function:        02/24/2022    1:44 PM  6CIT Screen  What Year? 0 points  What month? 0 points  What time? 0 points  Count back from 20 0 points  Months in reverse 0 points  Repeat phrase 0 points  Total Score 0 points  Immunizations Immunization History  Administered Date(s) Administered   Influenza, High Dose Seasonal PF 12/06/2016, 11/21/2017, 11/30/2018, 02/07/2020   Influenza-Unspecified 12/08/2015, 12/07/2016, 02/18/2021    PFIZER(Purple Top)SARS-COV-2 Vaccination 04/14/2019, 05/05/2019, 12/25/2019   Pfizer Covid-19 Vaccine Bivalent Booster 30yr & up 03/20/2021   Pneumococcal Conjugate-13 06/02/2012   Pneumococcal Polysaccharide-23 01/26/2013   Td 03/10/2011   Zoster Recombinat (Shingrix) 10/08/2016, 05/05/2017    TDAP status: Due, Education has been provided regarding the importance of this vaccine. Advised may receive this vaccine at local pharmacy or Health Dept. Aware to provide a copy of the vaccination record if obtained from local pharmacy or Health Dept. Verbalized acceptance and understanding.  Flu Vaccine status: Up to date  Pneumococcal vaccine status: Up to date  Covid-19 vaccine status: Completed vaccines  Qualifies for Shingles Vaccine? Yes   Zostavax completed Yes   Shingrix Completed?: Yes  Screening Tests Health Maintenance  Topic Date Due   DTaP/Tdap/Td (2 - Tdap) 03/09/2021   COVID-19 Vaccine (5 - 2023-24 season) 11/07/2021   DEXA SCAN  11/28/2021   Medicare Annual Wellness (AWV)  02/25/2023   Pneumonia Vaccine 80 Years old  Completed   INFLUENZA VACCINE  Completed   Zoster Vaccines- Shingrix  Completed   HPV VACCINES  Aged Out    Health Maintenance  Health Maintenance Due  Topic Date Due   DTaP/Tdap/Td (2 - Tdap) 03/09/2021   COVID-19 Vaccine (5 - 2023-24 season) 11/07/2021   DEXA SCAN  11/28/2021    Colorectal cancer screening: No longer required.   Mammogram status: Ordered 02/24/2022. Pt provided with contact info and advised to call to schedule appt.   Bone Density status: Ordered 12/24/2021. Pt provided with contact info and advised to call to schedule appt.  Lung Cancer Screening: (Low Dose CT Chest recommended if Age 80-80years, 30 pack-year currently smoking OR have quit w/in 15years.) does not qualify.   Lung Cancer Screening Referral: no  Additional Screening:  Hepatitis C Screening: does not qualify; Completed no  Vision Screening: Recommended  annual ophthalmology exams for early detection of glaucoma and other disorders of the eye. Is the patient up to date with their annual eye exam?  Yes  Who is the provider or what is the name of the office in which the patient attends annual eye exams? [facility] If pt is not established with a provider, would they like to be referred to a provider to establish care? No .   Dental Screening: Recommended annual dental exams for proper oral hygiene  Community Resource Referral / Chronic Care Management: CRR required this visit?  No   CCM required this visit?  No      Plan:     I have personally reviewed and noted the following in the patient's chart:   Medical and social history Use of alcohol, tobacco or illicit drugs  Current medications and supplements including opioid prescriptions. Patient is currently taking opioid prescriptions. Information provided to patient regarding non-opioid alternatives. Patient advised to discuss non-opioid treatment plan with their provider. Functional ability and status Nutritional status Physical activity Advanced directives List of other physicians Hospitalizations, surgeries, and ER visits in previous 12 months Vitals Screenings to include cognitive, depression, and falls Referrals and appointments  In addition, I have reviewed and discussed with patient certain preventive protocols, quality metrics, and best practice recommendations. A written personalized care plan for preventive services as well as general preventive health recommendations were provided to patient.     SSheral Flow LPN  02/24/2022   Nurse Notes: n/a

## 2022-03-12 ENCOUNTER — Other Ambulatory Visit (HOSPITAL_BASED_OUTPATIENT_CLINIC_OR_DEPARTMENT_OTHER): Payer: Self-pay

## 2022-03-12 ENCOUNTER — Telehealth: Payer: Self-pay | Admitting: Internal Medicine

## 2022-03-12 DIAGNOSIS — M545 Low back pain, unspecified: Secondary | ICD-10-CM

## 2022-03-12 MED ORDER — HYDROCODONE-ACETAMINOPHEN 10-325 MG PO TABS: 2.0000 | ORAL_TABLET | Freq: Two times a day (BID) | ORAL | 0 refills | Status: DC | PRN

## 2022-03-12 MED ORDER — AREXVY 120 MCG/0.5ML IM SUSR
INTRAMUSCULAR | 0 refills | Status: DC
  Filled 2022-03-12: qty 0.5, 1d supply, fill #0

## 2022-03-12 NOTE — Telephone Encounter (Signed)
Caller & Relationship to patient:  Patient   Call back number:[phone]   Date of last office visit:   Date of next office visit: 03/21/2021   Medication(s) to be refilled:hydrocodone        Preferred Pharmacy:  [facility]

## 2022-03-13 ENCOUNTER — Other Ambulatory Visit (HOSPITAL_BASED_OUTPATIENT_CLINIC_OR_DEPARTMENT_OTHER): Payer: Self-pay

## 2022-03-14 ENCOUNTER — Other Ambulatory Visit: Payer: Self-pay | Admitting: Internal Medicine

## 2022-03-24 ENCOUNTER — Encounter: Payer: Self-pay | Admitting: Internal Medicine

## 2022-03-24 NOTE — Progress Notes (Signed)
Subjective:    Patient ID: Tonya [name], female    DOB: 18-Mar-1940, 82 y.o.   MRN: [id number]     HPI Tonya [name] is here for follow up of her chronic medical problems, including chronic lower back pain, anxiety, depression, prediabetes, htn, gerd, hypothyroidism, OAB  Indication for chronic opioid: chronic LBP Medication and dose: norco 10-'325mg'$  1-2 tabs bid # pills per month: 120 Last UDS date: 03/2021 Pain contract signed (Y/N): y - 03/25/22 Date narcotic database last reviewed 03/25/2022  Pain assessment:  Amount of pain relief with medication: good Side effects:  no Sleep:  good Mood:  ok Functional activities: continues to be active:  yes  Taking pain medication daily Any concerning Alcohol, Street Drug use: no   Tinnitus - only bothers her when she is quiet.  Has hearing loss  Noise in left side of throat - not when she eats, she can feel it at times, no pain.  May come when she has to chew something more and swallows, but the other day it may have occurred outside of evening.  Balance is not as good.   Forgetting names and places, what can she do to help that situation.    Medications and allergies reviewed with patient and updated if appropriate.  Current Outpatient Medications on File Prior to Visit  Medication Sig Dispense Refill   budesonide-formoterol (SYMBICORT) 80-4.5 MCG/ACT inhaler TAKE 2 PUFFS BY MOUTH TWICE A DAY 30.6 each 4   Calcium Citrate 250 MG TABS Take 1 tablet by mouth daily.      cycloSPORINE (RESTASIS) 0.05 % ophthalmic emulsion Place 1 drop into both eyes 2 (two) times daily.     dimenhyDRINATE (DRAMAMINE) 50 MG tablet Take 50 mg by mouth every 8 (eight) hours as needed for nausea.     DULoxetine (CYMBALTA) 30 MG capsule TAKE 1 CAPSULE BY MOUTH EVERY DAY 90 capsule 2   HYDROcodone-acetaminophen (NORCO) 10-325 MG tablet Take 2 tablets by mouth 2 (two) times daily as needed (chronic lower back and neck pain). 120 tablet 0   levalbuterol  (XOPENEX HFA) 45 MCG/ACT inhaler Inhale 1-2 puffs into the lungs every 4 (four) hours as needed for wheezing. 1 each 8   nebivolol (BYSTOLIC) 2.5 MG tablet TAKE 1 TABLET BY MOUTH EVERY DAY 90 tablet 1   omeprazole (PRILOSEC) 20 MG capsule Take 1 capsule (20 mg total) by mouth daily. 90 capsule 3   oxybutynin (DITROPAN-XL) 10 MG 24 hr tablet TAKE 1 TABLET BY MOUTH EVERYDAY AT BEDTIME 90 tablet 1   SYNTHROID 50 MCG tablet TAKE 1 TABLET BY MOUTH DAILY BEFORE BREAKFAST 90 tablet 1   telmisartan (MICARDIS) 40 MG tablet TAKE 1 TABLET BY MOUTH EVERY DAY 90 tablet 1   triamcinolone cream (KENALOG) 0.1 % Apply topically 2 (two) times daily. (Patient not taking: Reported on 03/25/2022)     No current facility-administered medications on file prior to visit.     Review of Systems  Constitutional:  Negative for fever.  HENT:  Positive for hearing loss (right ear) and tinnitus.   Respiratory:  Negative for cough, shortness of breath and wheezing.   Cardiovascular:  Negative for chest pain, palpitations and leg swelling.  Gastrointestinal:  Positive for abdominal pain (occ in middle of night - improved pain).       No gerd  Genitourinary:  Positive for frequency (noctura 1-3 times).  Neurological:  Negative for light-headedness and headaches.       Objective:  Vitals:   03/25/22 1535  BP: 118/68  Pulse: 77  Temp: 98.4 F (36.9 C)  SpO2: 97%   BP Readings from Last 3 Encounters:  03/25/22 118/68  12/24/21 124/80  09/23/21 118/72   Wt Readings from Last 3 Encounters:  03/25/22 96 lb (43.5 kg)  02/24/22 96 lb 11.2 oz (43.9 kg)  12/24/21 99 lb (44.9 kg)   Body mass index is 21.52 kg/m.    Physical Exam Constitutional:      General: She is not in acute distress.    Appearance: Normal appearance.  HENT:     Head: Normocephalic and atraumatic.     Right Ear: Tympanic membrane, ear canal and external ear normal. There is no impacted cerumen.     Left Ear: Tympanic membrane, ear canal  and external ear normal. There is no impacted cerumen.  Eyes:     Conjunctiva/sclera: Conjunctivae normal.  Cardiovascular:     Rate and Rhythm: Normal rate and regular rhythm.     Heart sounds: Normal heart sounds. No murmur heard. Pulmonary:     Effort: Pulmonary effort is normal. No respiratory distress.     Breath sounds: Normal breath sounds. No wheezing.  Musculoskeletal:     Cervical back: Neck supple.     Right lower leg: No edema.     Left lower leg: No edema.  Lymphadenopathy:     Cervical: No cervical adenopathy.  Skin:    General: Skin is warm and dry.     Findings: No rash.  Neurological:     Mental Status: She is alert. Mental status is at baseline.  Psychiatric:        Mood and Affect: Mood normal.        Behavior: Behavior normal.        Lab Results  Component Value Date   WBC 7.8 09/23/2021   HGB 12.0 09/23/2021   HCT 36.6 09/23/2021   PLT 226.0 09/23/2021   GLUCOSE 120 (H) 12/24/2021   CHOL 199 06/20/2021   TRIG 54.0 06/20/2021   HDL 86.20 06/20/2021   LDLCALC 102 (H) 06/20/2021   ALT 14 09/23/2021   AST 19 09/23/2021   NA 144 12/24/2021   K 4.0 12/24/2021   CL 108 12/24/2021   CREATININE 0.98 12/24/2021   BUN 22 12/24/2021   CO2 28 12/24/2021   TSH 1.57 12/24/2021   HGBA1C 6.2 09/23/2021   MICROALBUR 1.2 03/15/2020     Assessment & Plan:    See Problem List for Assessment and Plan of chronic medical problems.

## 2022-03-24 NOTE — Patient Instructions (Addendum)
        Medications changes include :   none      Return in about 3 months (around 06/24/2022) for follow up.

## 2022-03-25 ENCOUNTER — Encounter: Payer: Self-pay | Admitting: Internal Medicine

## 2022-03-25 ENCOUNTER — Ambulatory Visit (INDEPENDENT_AMBULATORY_CARE_PROVIDER_SITE_OTHER): Payer: Medicare Other | Admitting: Internal Medicine

## 2022-03-25 VITALS — BP 118/68 | HR 77 | Temp 98.4°F | Ht <= 58 in | Wt 96.0 lb

## 2022-03-25 DIAGNOSIS — I10 Essential (primary) hypertension: Secondary | ICD-10-CM | POA: Diagnosis not present

## 2022-03-25 DIAGNOSIS — M545 Low back pain, unspecified: Secondary | ICD-10-CM

## 2022-03-25 DIAGNOSIS — E039 Hypothyroidism, unspecified: Secondary | ICD-10-CM | POA: Diagnosis not present

## 2022-03-25 DIAGNOSIS — F419 Anxiety disorder, unspecified: Secondary | ICD-10-CM

## 2022-03-25 DIAGNOSIS — G8929 Other chronic pain: Secondary | ICD-10-CM

## 2022-03-25 DIAGNOSIS — K5730 Diverticulosis of large intestine without perforation or abscess without bleeding: Secondary | ICD-10-CM | POA: Insufficient documentation

## 2022-03-25 DIAGNOSIS — F3289 Other specified depressive episodes: Secondary | ICD-10-CM

## 2022-03-25 DIAGNOSIS — R2689 Other abnormalities of gait and mobility: Secondary | ICD-10-CM

## 2022-03-25 DIAGNOSIS — R7303 Prediabetes: Secondary | ICD-10-CM

## 2022-03-25 DIAGNOSIS — N3281 Overactive bladder: Secondary | ICD-10-CM

## 2022-03-25 DIAGNOSIS — R6889 Other general symptoms and signs: Secondary | ICD-10-CM | POA: Insufficient documentation

## 2022-03-25 DIAGNOSIS — H9319 Tinnitus, unspecified ear: Secondary | ICD-10-CM | POA: Insufficient documentation

## 2022-03-25 DIAGNOSIS — R413 Other amnesia: Secondary | ICD-10-CM | POA: Insufficient documentation

## 2022-03-25 DIAGNOSIS — H9313 Tinnitus, bilateral: Secondary | ICD-10-CM

## 2022-03-25 DIAGNOSIS — K219 Gastro-esophageal reflux disease without esophagitis: Secondary | ICD-10-CM | POA: Diagnosis not present

## 2022-03-25 MED ORDER — OMEPRAZOLE 40 MG PO CPDR: 40.0000 mg | DELAYED_RELEASE_CAPSULE | Freq: Every day | ORAL | 3 refills | Status: DC

## 2022-03-25 NOTE — Assessment & Plan Note (Addendum)
Chronic Pain management for chronic back pain She is taking her pain medication appropriately and it is controlling her pain and making her functional No side effects [place] controlled substance database checked Continue Norco 10-325 mg 1-2 tabs twice daily as needed Urine tox today Pain contract today

## 2022-03-25 NOTE — Assessment & Plan Note (Signed)
Chronic Blood pressure well controlled CMP Continue nebivolol 2.5 mg daily, telmisartan 40 mg daily

## 2022-03-25 NOTE — Assessment & Plan Note (Signed)
Chronic  Clinically euthyroid Check tsh and will titrate med dose if needed Currently taking Synthroid 50 mcg daily

## 2022-03-25 NOTE — Assessment & Plan Note (Signed)
Chronic Continue oxybutynin XL 10 mg nightly

## 2022-03-25 NOTE — Assessment & Plan Note (Signed)
Chronic Controlled, Stable Continue Cymbalta 30 mg daily

## 2022-03-25 NOTE — Assessment & Plan Note (Signed)
Chronic Pain overall controlled with current regimen She is taking medication appropriately [place] controlled substance database checked Continue Norco 10-325 mg 1-2 tabs twice daily as needed Follow-up in 3 months

## 2022-03-25 NOTE — Assessment & Plan Note (Signed)
Acute Has had noise in her throat - not always related to eating or activity ? Related to cervical spine - has known OA She will monitor for now

## 2022-03-25 NOTE — Assessment & Plan Note (Signed)
Chronic Check a1c Low sugar / carb diet Stressed regular exercise   

## 2022-03-25 NOTE — Assessment & Plan Note (Addendum)
Chronic GERD controlled Continue omeprazole 40 mg daily 

## 2022-03-25 NOTE — Assessment & Plan Note (Signed)
Subacute Has gotten worse Has hearing loss which may be the cause of the tinnitus Advised evaluation for hearing aids Exam in normal. - no wax

## 2022-03-25 NOTE — Assessment & Plan Note (Signed)
Mild memory concerns Difficulty with name and place recall - may be normal for aging Discussed things to do to improve memory/ help prevent worsening of memory Discussed referral to neuropsych if desired

## 2022-03-25 NOTE — Assessment & Plan Note (Signed)
Has noticed balance is not as good -- Advised regular exercise Advised starting to do balance exercises

## 2022-04-01 ENCOUNTER — Other Ambulatory Visit: Payer: Self-pay | Admitting: Internal Medicine

## 2022-04-09 ENCOUNTER — Other Ambulatory Visit: Payer: Self-pay

## 2022-04-09 ENCOUNTER — Other Ambulatory Visit: Payer: Self-pay | Admitting: Internal Medicine

## 2022-04-15 ENCOUNTER — Telehealth: Payer: Self-pay | Admitting: Internal Medicine

## 2022-04-15 DIAGNOSIS — G8929 Other chronic pain: Secondary | ICD-10-CM

## 2022-04-15 MED ORDER — HYDROCODONE-ACETAMINOPHEN 10-325 MG PO TABS: 2.0000 | ORAL_TABLET | Freq: Two times a day (BID) | ORAL | 0 refills | Status: DC | PRN

## 2022-04-15 NOTE — Telephone Encounter (Signed)
Caller & Relationship to patient: Self  Call back number: [phone]   Date of last office visit: 1.17.24  Date of next office visit: 4.17.24  Medication(s) to be refilled:  HYDROcodone-acetaminophen [facility]) 10-325 MG tablet   Preferred Pharmacy:  [id number]  Phone: [phone] Fax: [phone]

## 2022-04-28 ENCOUNTER — Other Ambulatory Visit (HOSPITAL_BASED_OUTPATIENT_CLINIC_OR_DEPARTMENT_OTHER): Payer: Self-pay

## 2022-04-28 ENCOUNTER — Ambulatory Visit
Admission: RE | Admit: 2022-04-28 | Discharge: 2022-04-28 | Disposition: A | Discharge status: Home / Self Care | Payer: Medicare Other | Source: Ambulatory Visit | Attending: Internal Medicine | Admitting: Internal Medicine

## 2022-04-28 DIAGNOSIS — Z1231 Encounter for screening mammogram for malignant neoplasm of breast: Secondary | ICD-10-CM | POA: Diagnosis not present

## 2022-04-28 DIAGNOSIS — Z1239 Encounter for other screening for malignant neoplasm of breast: Secondary | ICD-10-CM

## 2022-04-28 MED ORDER — COMIRNATY 30 MCG/0.3ML IM SUSY
PREFILLED_SYRINGE | INTRAMUSCULAR | 0 refills | Status: DC
  Filled 2022-04-28: qty 0.3, 1d supply, fill #0

## 2022-05-21 ENCOUNTER — Telehealth: Payer: Self-pay | Admitting: Internal Medicine

## 2022-05-21 DIAGNOSIS — G8929 Other chronic pain: Secondary | ICD-10-CM

## 2022-05-21 MED ORDER — HYDROCODONE-ACETAMINOPHEN 10-325 MG PO TABS: 2.0000 | ORAL_TABLET | Freq: Two times a day (BID) | ORAL | 0 refills | Status: DC | PRN

## 2022-05-21 NOTE — Telephone Encounter (Signed)
Prescription Request  05/21/2022  LOV: 03/25/2022  What is the name of the medication or equipment? hydrocodone  Have you contacted your pharmacy to request a refill? No    Which pharmacy would you like this sent to?  [facility] [place] [place] - [address] [place] [address] Phone: [phone] Fax: [phone]     Patient notified that their request is being sent to the clinical staff for review and that they should receive a response within 2 business days.   Please advise at Mobile [phone] (mobile)

## 2022-06-12 ENCOUNTER — Other Ambulatory Visit: Payer: Self-pay | Admitting: Internal Medicine

## 2022-06-22 ENCOUNTER — Telehealth: Payer: Self-pay | Admitting: Internal Medicine

## 2022-06-22 DIAGNOSIS — G8929 Other chronic pain: Secondary | ICD-10-CM

## 2022-06-22 MED ORDER — HYDROCODONE-ACETAMINOPHEN 10-325 MG PO TABS
2.0000 | ORAL_TABLET | Freq: Two times a day (BID) | ORAL | 0 refills | Status: DC | PRN
Start: 2022-06-22 — End: 2022-07-24

## 2022-06-22 NOTE — Telephone Encounter (Signed)
Prescription Request  06/22/2022  LOV: 03/25/2022  What is the name of the medication or equipment? hydrocodone  Have you contacted your pharmacy to request a refill? No   Which pharmacy would you like this sent to?  [id number] [place], [place] - [address] [address] Phone: [phone] Fax: [phone]   Patient notified that their request is being sent to the clinical staff for review and that they should receive a response within 2 business days.   Please advise at Mobile [phone] (mobile)

## 2022-06-23 NOTE — Patient Instructions (Addendum)
      Blood work was ordered.   The lab is on the first floor.    Medications changes include :   none     Return in about 3 months (around 09/23/2022) for follow up.

## 2022-06-23 NOTE — Progress Notes (Unsigned)
Subjective:    Patient ID: Tonya [name], female    DOB: 1940-04-26, 82 y.o.   MRN: [id number]     HPI Tonya [name] is here for follow up of her chronic medical problems.  The patient is here for follow up for chronic pain management.  Indication for chronic opioid: chronic back pain Medication and dose: norco 10-325 mg 2 tab bid prn # pills per month: 120  Last UDS date: 03/13/21 Pain contract signed (Y/N): yes Date narcotic database last reviewed 06/24/22  Pain assessment:  Pain intensity: 2/10  Amount of pain relief with medication: good Use of pain medications: appropriately taking medications Side effects:  no Sleep:  good Mood:  good Functional activities: continues to be active:  yes  Last took prescribed pain medication:  today Any concerning Alcohol, Street Drug use: no   Medications and allergies reviewed with patient and updated if appropriate.  Current Outpatient Medications on File Prior to Visit  Medication Sig Dispense Refill   budesonide-formoterol (SYMBICORT) 80-4.5 MCG/ACT inhaler TAKE 2 PUFFS BY MOUTH TWICE A DAY 30.6 each 4   Calcium Citrate 250 MG TABS Take 1 tablet by mouth daily.      COVID-19 mRNA vaccine 2023-2024 (COMIRNATY) syringe Inject into the muscle. 0.3 mL 0   cycloSPORINE (RESTASIS) 0.05 % ophthalmic emulsion Place 1 drop into both eyes 2 (two) times daily.     dimenhyDRINATE (DRAMAMINE) 50 MG tablet Take 50 mg by mouth every 8 (eight) hours as needed for nausea.     DULoxetine (CYMBALTA) 30 MG capsule TAKE 1 CAPSULE BY MOUTH EVERY DAY 90 capsule 2   HYDROcodone-acetaminophen (NORCO) 10-325 MG tablet Take 2 tablets by mouth 2 (two) times daily as needed (chronic lower back and neck pain). 120 tablet 0   levalbuterol (XOPENEX HFA) 45 MCG/ACT inhaler Inhale 1-2 puffs into the lungs every 4 (four) hours as needed for wheezing. 1 each 8   nebivolol (BYSTOLIC) 2.5 MG tablet TAKE 1 TABLET BY MOUTH EVERY DAY 90 tablet 1   omeprazole  (PRILOSEC) 40 MG capsule Take 1 capsule (40 mg total) by mouth daily. 90 capsule 3   oxybutynin (DITROPAN-XL) 10 MG 24 hr tablet TAKE 1 TABLET BY MOUTH EVERYDAY AT BEDTIME 90 tablet 1   SYNTHROID 50 MCG tablet TAKE 1 TABLET BY MOUTH EVERY DAY BEFORE BREAKFAST 90 tablet 1   telmisartan (MICARDIS) 40 MG tablet TAKE 1 TABLET BY MOUTH EVERY DAY 90 tablet 1   No current facility-administered medications on file prior to visit.     Review of Systems     Objective:  There were no vitals filed for this visit. BP Readings from Last 3 Encounters:  03/25/22 118/68  12/24/21 124/80  09/23/21 118/72   Wt Readings from Last 3 Encounters:  03/25/22 96 lb (43.5 kg)  02/24/22 96 lb 11.2 oz (43.9 kg)  12/24/21 99 lb (44.9 kg)   There is no height or weight on file to calculate BMI.    Physical Exam     Lab Results  Component Value Date   WBC 7.8 09/23/2021   HGB 12.0 09/23/2021   HCT 36.6 09/23/2021   PLT 226.0 09/23/2021   GLUCOSE 120 (H) 12/24/2021   CHOL 199 06/20/2021   TRIG 54.0 06/20/2021   HDL 86.20 06/20/2021   LDLCALC 102 (H) 06/20/2021   ALT 14 09/23/2021   AST 19 09/23/2021   NA 144 12/24/2021   K 4.0 12/24/2021   CL 108 12/24/2021  CREATININE 0.98 12/24/2021   BUN 22 12/24/2021   CO2 28 12/24/2021   TSH 1.57 12/24/2021   HGBA1C 6.2 09/23/2021   MICROALBUR 1.2 03/15/2020     Assessment & Plan:    See Problem List for Assessment and Plan of chronic medical problems.

## 2022-06-24 ENCOUNTER — Ambulatory Visit (INDEPENDENT_AMBULATORY_CARE_PROVIDER_SITE_OTHER): Payer: Medicare Other | Admitting: Internal Medicine

## 2022-06-24 ENCOUNTER — Encounter: Payer: Self-pay | Admitting: Internal Medicine

## 2022-06-24 VITALS — BP 108/72 | HR 70 | Temp 97.9°F | Ht <= 58 in | Wt 99.4 lb

## 2022-06-24 DIAGNOSIS — K219 Gastro-esophageal reflux disease without esophagitis: Secondary | ICD-10-CM | POA: Diagnosis not present

## 2022-06-24 DIAGNOSIS — F419 Anxiety disorder, unspecified: Secondary | ICD-10-CM

## 2022-06-24 DIAGNOSIS — R7303 Prediabetes: Secondary | ICD-10-CM

## 2022-06-24 DIAGNOSIS — E039 Hypothyroidism, unspecified: Secondary | ICD-10-CM | POA: Diagnosis not present

## 2022-06-24 DIAGNOSIS — I10 Essential (primary) hypertension: Secondary | ICD-10-CM

## 2022-06-24 DIAGNOSIS — J4530 Mild persistent asthma, uncomplicated: Secondary | ICD-10-CM

## 2022-06-24 DIAGNOSIS — N3281 Overactive bladder: Secondary | ICD-10-CM

## 2022-06-24 DIAGNOSIS — N1831 Chronic kidney disease, stage 3a: Secondary | ICD-10-CM

## 2022-06-24 DIAGNOSIS — G8929 Other chronic pain: Secondary | ICD-10-CM

## 2022-06-24 DIAGNOSIS — F3289 Other specified depressive episodes: Secondary | ICD-10-CM

## 2022-06-24 DIAGNOSIS — M545 Low back pain, unspecified: Secondary | ICD-10-CM

## 2022-06-24 LAB — CBC WITH DIFFERENTIAL/PLATELET
Basophils Absolute: 0.1 10*3/uL (ref 0.0–0.1)
Basophils Relative: 1 % (ref 0.0–3.0)
Eosinophils Absolute: 0.3 10*3/uL (ref 0.0–0.7)
Eosinophils Relative: 3.8 % (ref 0.0–5.0)
HCT: 36.9 % (ref 36.0–46.0)
Hemoglobin: 12.2 g/dL (ref 12.0–15.0)
Lymphocytes Relative: 35.4 % (ref 12.0–46.0)
Lymphs Abs: 2.4 10*3/uL (ref 0.7–4.0)
MCHC: 33.1 g/dL (ref 30.0–36.0)
MCV: 93.7 fl (ref 78.0–100.0)
Monocytes Absolute: 0.7 10*3/uL (ref 0.1–1.0)
Monocytes Relative: 9.9 % (ref 3.0–12.0)
Neutro Abs: 3.4 10*3/uL (ref 1.4–7.7)
Neutrophils Relative %: 49.9 % (ref 43.0–77.0)
Platelets: 194 10*3/uL (ref 150.0–400.0)
RBC: 3.94 Mil/uL (ref 3.87–5.11)
RDW: 13.9 % (ref 11.5–15.5)
WBC: 6.8 10*3/uL (ref 4.0–10.5)

## 2022-06-24 LAB — TSH: TSH: 18.83 u[IU]/mL — ABNORMAL HIGH (ref 0.35–5.50)

## 2022-06-24 LAB — HEMOGLOBIN A1C: Hgb A1c MFr Bld: 6.2 % (ref 4.6–6.5)

## 2022-06-24 MED ORDER — DULOXETINE HCL 30 MG PO CPEP: 30.0000 mg | ORAL_CAPSULE | Freq: Every day | ORAL | 2 refills | Status: DC

## 2022-06-24 NOTE — Assessment & Plan Note (Signed)
Chronic Pain management for chronic back pain She is taking her pain medication appropriately and it is controlling her pain and making her functional No side effects [place] controlled substance database checked Continue Norco 10-325 mg 1-2 tabs twice daily as needed

## 2022-06-24 NOTE — Assessment & Plan Note (Signed)
Chronic Controlled, Stable Continue Cymbalta 30 mg daily 

## 2022-06-24 NOTE — Assessment & Plan Note (Signed)
Chronic GERD controlled Continue omeprazole 40 mg daily 

## 2022-06-24 NOTE — Assessment & Plan Note (Signed)
Chronic Check a1c Low sugar / carb diet Stressed regular exercise  

## 2022-06-24 NOTE — Assessment & Plan Note (Signed)
Chronic Pain overall controlled with current regimen She is taking medication appropriately [place] controlled substance database checked Continue Norco 10-325 mg 1-2 tabs twice daily as needed Follow-up in 3 months 

## 2022-06-24 NOTE — Assessment & Plan Note (Signed)
Chronic Continue oxybutynin XL 10 mg nightly 

## 2022-06-24 NOTE — Assessment & Plan Note (Signed)
Chronic Blood pressure well controlled CMP Continue nebivolol 2.5 mg daily, telmisartan 40 mg daily 

## 2022-06-24 NOTE — Assessment & Plan Note (Signed)
Chronic  Clinically euthyroid Check tsh and will titrate med dose if needed Currently taking Synthroid 50 mcg daily 

## 2022-06-24 NOTE — Assessment & Plan Note (Signed)
Chronic ?Controlled, stable ?Mild, persistent ?Continue symbicort bid, xopenex as needed ? ?

## 2022-06-25 LAB — COMPREHENSIVE METABOLIC PANEL
ALT: 12 U/L (ref 0–35)
AST: 17 U/L (ref 0–37)
Albumin: 4.4 g/dL (ref 3.5–5.2)
Alkaline Phosphatase: 61 U/L (ref 39–117)
BUN: 37 mg/dL — ABNORMAL HIGH (ref 6–23)
CO2: 28 mEq/L (ref 19–32)
Calcium: 9.5 mg/dL (ref 8.4–10.5)
Chloride: 100 mEq/L (ref 96–112)
Creatinine, Ser: 1.32 mg/dL — ABNORMAL HIGH (ref 0.40–1.20)
GFR: 38.14 mL/min — ABNORMAL LOW (ref >60.00)
Glucose, Bld: 97 mg/dL (ref 70–99)
Potassium: 4.7 mEq/L (ref 3.5–5.1)
Sodium: 136 mEq/L (ref 135–145)
Total Bilirubin: 0.4 mg/dL (ref 0.2–1.2)
Total Protein: 7 g/dL (ref 6.0–8.3)

## 2022-06-26 NOTE — Addendum Note (Signed)
Addended by: Pincus Sanes on: 06/26/2022 12:59 PM   Modules accepted: Orders

## 2022-07-14 ENCOUNTER — Other Ambulatory Visit (INDEPENDENT_AMBULATORY_CARE_PROVIDER_SITE_OTHER): Payer: Medicare Other

## 2022-07-14 ENCOUNTER — Other Ambulatory Visit: Payer: Self-pay

## 2022-07-14 DIAGNOSIS — E039 Hypothyroidism, unspecified: Secondary | ICD-10-CM

## 2022-07-14 DIAGNOSIS — R7303 Prediabetes: Secondary | ICD-10-CM

## 2022-07-14 DIAGNOSIS — N1831 Chronic kidney disease, stage 3a: Secondary | ICD-10-CM

## 2022-07-14 DIAGNOSIS — I10 Essential (primary) hypertension: Secondary | ICD-10-CM

## 2022-07-14 DIAGNOSIS — G8929 Other chronic pain: Secondary | ICD-10-CM | POA: Diagnosis not present

## 2022-07-14 LAB — BASIC METABOLIC PANEL
BUN: 21 mg/dL (ref 6–23)
CO2: 28 mEq/L (ref 19–32)
Calcium: 9.3 mg/dL (ref 8.4–10.5)
Chloride: 104 mEq/L (ref 96–112)
Creatinine, Ser: 0.91 mg/dL (ref 0.40–1.20)
GFR: 59.57 mL/min — ABNORMAL LOW (ref >60.00)
Glucose, Bld: 90 mg/dL (ref 70–99)
Potassium: 4.4 mEq/L (ref 3.5–5.1)
Sodium: 140 mEq/L (ref 135–145)

## 2022-07-16 LAB — DRUG MONITOR, OPIATES,W/CONF, URINE
Codeine: NEGATIVE ng/mL (ref <50)
Hydrocodone: 1598 ng/mL — ABNORMAL HIGH (ref <50)
Hydromorphone: 577 ng/mL — ABNORMAL HIGH (ref <50)
Morphine: NEGATIVE ng/mL (ref <50)
Norhydrocodone: 5141 ng/mL — ABNORMAL HIGH (ref <50)
Opiates: POSITIVE ng/mL — AB (ref <100)

## 2022-07-16 LAB — DRUG MONITOR, TRAMADOL,QN, URINE
Desmethyltramadol: NEGATIVE ng/mL (ref <100)
Tramadol: NEGATIVE ng/mL (ref <100)

## 2022-07-16 LAB — DRUG MONITOR, OXYCODONE,W/CONF, URINE: Oxycodone: NEGATIVE ng/mL (ref <100)

## 2022-07-16 LAB — DRUG MONITOR, BENZO,W/CONF, URINE: Benzodiazepines: NEGATIVE ng/mL (ref <100)

## 2022-07-16 LAB — DRUG MONITOR,BARBITURATE,W/CONF, URINE: Barbiturates: NEGATIVE ng/mL (ref <300)

## 2022-07-16 LAB — DM TEMPLATE

## 2022-07-16 LAB — DRUG MONITOR, COCAINEMETAB, W/CONF, URINE: Cocaine Metabolite: NEGATIVE ng/mL (ref <150)

## 2022-07-16 LAB — PRESCRIBED DRUGS,MEDMATCH(R)

## 2022-07-16 LAB — DRUG MONITOR,AMPHETAMINE,W/CONF, URINE: Amphetamines: NEGATIVE ng/mL (ref <500)

## 2022-07-21 DIAGNOSIS — H5211 Myopia, right eye: Secondary | ICD-10-CM | POA: Diagnosis not present

## 2022-07-24 ENCOUNTER — Telehealth: Payer: Self-pay | Admitting: Internal Medicine

## 2022-07-24 DIAGNOSIS — M545 Low back pain, unspecified: Secondary | ICD-10-CM

## 2022-07-24 MED ORDER — HYDROCODONE-ACETAMINOPHEN 10-325 MG PO TABS
2.0000 | ORAL_TABLET | Freq: Two times a day (BID) | ORAL | 0 refills | Status: DC | PRN
Start: 2022-07-24 — End: 2022-08-28

## 2022-07-24 NOTE — Telephone Encounter (Signed)
Prescription Request  07/24/2022  LOV: 06/24/2022  What is the name of the medication or equipment?  HYDROcodone-acetaminophen (NORCO) 10-325 MG tablet   Have you contacted your pharmacy to request a refill? No   Which pharmacy would you like this sent to?  [id number] [place], [place] - [address] [address] Phone: [phone] Fax: [phone]    Patient notified that their request is being sent to the clinical staff for review and that they should receive a response within 2 business days.   Please advise at [facility] [phone]

## 2022-08-24 ENCOUNTER — Ambulatory Visit
Admission: RE | Admit: 2022-08-24 | Discharge: 2022-08-24 | Disposition: A | Discharge status: Home / Self Care | Payer: BC Managed Care – PPO | Source: Ambulatory Visit | Attending: Internal Medicine | Admitting: Internal Medicine

## 2022-08-24 DIAGNOSIS — R29890 Loss of height: Secondary | ICD-10-CM | POA: Diagnosis not present

## 2022-08-24 DIAGNOSIS — E039 Hypothyroidism, unspecified: Secondary | ICD-10-CM | POA: Diagnosis not present

## 2022-08-24 DIAGNOSIS — E2839 Other primary ovarian failure: Secondary | ICD-10-CM | POA: Diagnosis not present

## 2022-08-24 DIAGNOSIS — Z8262 Family history of osteoporosis: Secondary | ICD-10-CM | POA: Diagnosis not present

## 2022-08-24 DIAGNOSIS — M8589 Other specified disorders of bone density and structure, multiple sites: Secondary | ICD-10-CM

## 2022-08-26 ENCOUNTER — Telehealth: Payer: Self-pay | Admitting: Internal Medicine

## 2022-08-26 DIAGNOSIS — M545 Low back pain, unspecified: Secondary | ICD-10-CM

## 2022-08-26 NOTE — Telephone Encounter (Signed)
Prescription Request  08/26/2022  LOV: 06/24/2022  What is the name of the medication or equipment? HYDROcodone-acetaminophen (NORCO) 10-325 MG tablet   Have you contacted your pharmacy to request a refill? No   Which pharmacy would you like this sent to?  [id number] [place], [place] - [address] [place] [address] Phone: [phone] Fax: [phone]    Patient notified that their request is being sent to the clinical staff for review and that they should receive a response within 2 business days.   Please advise at [facility] [phone]

## 2022-08-28 MED ORDER — HYDROCODONE-ACETAMINOPHEN 10-325 MG PO TABS
2.0000 | ORAL_TABLET | Freq: Two times a day (BID) | ORAL | 0 refills | Status: DC | PRN
Start: 2022-08-28 — End: 2022-09-28

## 2022-09-15 ENCOUNTER — Encounter: Payer: Self-pay | Admitting: Internal Medicine

## 2022-09-15 NOTE — Patient Instructions (Addendum)
      Work on your sleep.   Take your thyroid medication consistently at the same time of day - it should be 30 minutes prior to eating or 3 hours after eating.  Ideally you should not have any vitamins w/in 4 hours of taking the thyroid medication.   Medications changes include :   None     Return in about 3 months (around 12/17/2022) for follow up.

## 2022-09-15 NOTE — Progress Notes (Signed)
Subjective:    Patient ID: Tonya [name], female    DOB: September 22, 1940, 82 y.o.   MRN: [id number]     HPI Tonya [name] is here for follow up of her chronic medical problems.   The patient is here for follow up for chronic pain management.  Indication for chronic opioid: Chronic back pain Medication and dose: Norco 10-325 mg 2 tabs twice daily. # pills per month: 120 Last UDS date:07/2022 Pain contract signed (Y/N): Yes Date narcotic database last reviewed 09/16/22   Pain assessment:  Pain intensity: 2/10 on a daily basis Amount of pain relief with medication: Good Use of pain medications: appropriately taking medications Side effects:  no Sleep: Not great Mood:: Good Functional activities: continues to be active:  yes  Has needed to take advil occasionally for back pain.  She may take maybe 4 in one month.   Sleep schedule  messed up - going to bed too late and getting up too late.  Wakes 3-4 times a night to urinate.    Not always taking thyroid medication consistently.     Medications and allergies reviewed with patient and updated if appropriate.  Current Outpatient Medications on File Prior to Visit  Medication Sig Dispense Refill   budesonide-formoterol (SYMBICORT) 80-4.5 MCG/ACT inhaler TAKE 2 PUFFS BY MOUTH TWICE A DAY 30.6 each 4   Calcium Citrate 250 MG TABS Take 1 tablet by mouth daily.      cycloSPORINE (RESTASIS) 0.05 % ophthalmic emulsion Place 1 drop into both eyes 2 (two) times daily.     dimenhyDRINATE (DRAMAMINE) 50 MG tablet Take 50 mg by mouth every 8 (eight) hours as needed for nausea.     DULoxetine (CYMBALTA) 30 MG capsule Take 1 capsule (30 mg total) by mouth daily. 90 capsule 2   HYDROcodone-acetaminophen (NORCO) 10-325 MG tablet Take 2 tablets by mouth 2 (two) times daily as needed (chronic lower back and neck pain). 120 tablet 0   levalbuterol (XOPENEX HFA) 45 MCG/ACT inhaler Inhale 1-2 puffs into the lungs every 4 (four) hours as needed for  wheezing. 1 each 8   nebivolol (BYSTOLIC) 2.5 MG tablet TAKE 1 TABLET BY MOUTH EVERY DAY 90 tablet 1   omeprazole (PRILOSEC) 40 MG capsule Take 1 capsule (40 mg total) by mouth daily. 90 capsule 3   oxybutynin (DITROPAN-XL) 10 MG 24 hr tablet TAKE 1 TABLET BY MOUTH EVERYDAY AT BEDTIME 90 tablet 1   SYNTHROID 50 MCG tablet TAKE 1 TABLET BY MOUTH EVERY DAY BEFORE BREAKFAST 90 tablet 1   telmisartan (MICARDIS) 40 MG tablet TAKE 1 TABLET BY MOUTH EVERY DAY 90 tablet 1   No current facility-administered medications on file prior to visit.     Review of Systems     Objective:   Vitals:   09/16/22 1529  BP: 136/60  Pulse: 90  Temp: 98 F (36.7 C)  SpO2: 96%   BP Readings from Last 3 Encounters:  09/16/22 136/60  06/24/22 108/72  03/25/22 118/68   Wt Readings from Last 3 Encounters:  09/16/22 96 lb 12.8 oz (43.9 kg)  06/24/22 99 lb 6.4 oz (45.1 kg)  03/25/22 96 lb (43.5 kg)   Body mass index is 21.7 kg/m.    Physical Exam Constitutional:      General: She is not in acute distress.    Appearance: Normal appearance. She is not ill-appearing.  HENT:     Head: Normocephalic and atraumatic.  Skin:    General: Skin  is warm and dry.  Neurological:     Mental Status: She is alert. Mental status is at baseline.  Psychiatric:        Mood and Affect: Mood normal.        Behavior: Behavior normal.        Thought Content: Thought content normal.        Judgment: Judgment normal.        Lab Results  Component Value Date   WBC 6.8 06/24/2022   HGB 12.2 06/24/2022   HCT 36.9 06/24/2022   PLT 194.0 06/24/2022   GLUCOSE 90 07/14/2022   CHOL 199 06/20/2021   TRIG 54.0 06/20/2021   HDL 86.20 06/20/2021   LDLCALC 102 (H) 06/20/2021   ALT 12 06/24/2022   AST 17 06/24/2022   NA 140 07/14/2022   K 4.4 07/14/2022   CL 104 07/14/2022   CREATININE 0.91 07/14/2022   BUN 21 07/14/2022   CO2 28 07/14/2022   TSH 18.83 (H) 06/24/2022   HGBA1C 6.2 06/24/2022   MICROALBUR 1.2  03/15/2020     Assessment & Plan:    See Problem List for Assessment and Plan of chronic medical problems.

## 2022-09-16 ENCOUNTER — Ambulatory Visit (INDEPENDENT_AMBULATORY_CARE_PROVIDER_SITE_OTHER): Payer: Medicare Other | Admitting: Internal Medicine

## 2022-09-16 VITALS — BP 136/60 | HR 90 | Temp 98.0°F | Ht <= 58 in | Wt 96.8 lb

## 2022-09-16 DIAGNOSIS — E039 Hypothyroidism, unspecified: Secondary | ICD-10-CM

## 2022-09-16 DIAGNOSIS — I10 Essential (primary) hypertension: Secondary | ICD-10-CM | POA: Diagnosis not present

## 2022-09-16 DIAGNOSIS — G8929 Other chronic pain: Secondary | ICD-10-CM

## 2022-09-16 DIAGNOSIS — M545 Low back pain, unspecified: Secondary | ICD-10-CM | POA: Diagnosis not present

## 2022-09-16 NOTE — Assessment & Plan Note (Addendum)
Chronic  Clinically euthyroid Last TSH elevated, but was not taking medication regularly Not always taking it consistently Currently taking Synthroid 50 mcg daily - continue Reviewed how to properly take thyroid medication Will check tsh in 3 months

## 2022-09-16 NOTE — Assessment & Plan Note (Signed)
Chronic Pain management for chronic back pain She is taking her pain medication appropriately and it is controlling her pain and making her functional No side effects [place] controlled substance database checked Continue Norco 10-325 mg 1-2 tabs twice daily as needed  

## 2022-09-16 NOTE — Assessment & Plan Note (Addendum)
Chronic Blood pressure well controlled Continue nebivolol 2.5 mg daily, telmisartan 40 mg daily

## 2022-09-16 NOTE — Assessment & Plan Note (Signed)
Chronic Pain overall controlled with current regimen She is taking medication appropriately [place] controlled substance database checked Continue Norco 10-325 mg 1-2 tabs twice daily as needed Follow-up in 3 months 

## 2022-09-23 ENCOUNTER — Other Ambulatory Visit (HOSPITAL_BASED_OUTPATIENT_CLINIC_OR_DEPARTMENT_OTHER): Payer: Self-pay

## 2022-09-28 ENCOUNTER — Telehealth: Payer: Self-pay | Admitting: Internal Medicine

## 2022-09-28 DIAGNOSIS — M545 Low back pain, unspecified: Secondary | ICD-10-CM

## 2022-09-28 MED ORDER — HYDROCODONE-ACETAMINOPHEN 10-325 MG PO TABS
2.0000 | ORAL_TABLET | Freq: Two times a day (BID) | ORAL | 0 refills | Status: DC | PRN
Start: 2022-09-28 — End: 2022-10-30

## 2022-09-28 NOTE — Telephone Encounter (Signed)
MD out of the office until 7/29. Forwarding to DOD.Marland KitchenShearon Stalls

## 2022-09-28 NOTE — Telephone Encounter (Signed)
Sent in

## 2022-09-28 NOTE — Telephone Encounter (Signed)
Prescription Request  09/28/2022  LOV: 09/16/2022  What is the name of the medication or equipment? HYDROcodone-acetaminophen (NORCO) 10-325 MG tablet   Have you contacted your pharmacy to request a refill? No   Which pharmacy would you like this sent to?    [id number] [place], [place] - [address] [address] Phone: [phone] Fax: [phone]   Patient notified that their request is being sent to the clinical staff for review and that they should receive a response within 2 business days.   Please advise at Mobile [phone] (mobile)

## 2022-09-28 NOTE — Telephone Encounter (Signed)
Notified pt rx has been [facility] to [facility]

## 2022-09-30 ENCOUNTER — Other Ambulatory Visit: Payer: Self-pay | Admitting: Internal Medicine

## 2022-10-15 ENCOUNTER — Other Ambulatory Visit: Payer: Self-pay | Admitting: Internal Medicine

## 2022-10-30 ENCOUNTER — Telehealth: Payer: Self-pay | Admitting: Internal Medicine

## 2022-10-30 DIAGNOSIS — G8929 Other chronic pain: Secondary | ICD-10-CM

## 2022-10-30 MED ORDER — HYDROCODONE-ACETAMINOPHEN 10-325 MG PO TABS
2.0000 | ORAL_TABLET | Freq: Two times a day (BID) | ORAL | 0 refills | Status: DC | PRN
Start: 2022-10-30 — End: 2022-12-01

## 2022-10-30 NOTE — Telephone Encounter (Signed)
Prescription Request  10/30/2022  LOV: 09/16/2022  What is the name of the medication or equipment? HYDROcodone-acetaminophen (NORCO) 10-325 MG tablet   Have you contacted your pharmacy to request a refill? No   Which pharmacy would you like this sent to?  [id number] [place], [place] - [address] [address] Phone: [phone] Fax: [phone]    Patient notified that their request is being sent to the clinical staff for review and that they should receive a response within 2 business days.   Please advise at [facility] [phone]

## 2022-11-11 ENCOUNTER — Other Ambulatory Visit: Payer: Self-pay | Admitting: Internal Medicine

## 2022-12-01 ENCOUNTER — Telehealth: Payer: Self-pay | Admitting: Internal Medicine

## 2022-12-01 DIAGNOSIS — G8929 Other chronic pain: Secondary | ICD-10-CM

## 2022-12-01 MED ORDER — HYDROCODONE-ACETAMINOPHEN 10-325 MG PO TABS
2.0000 | ORAL_TABLET | Freq: Two times a day (BID) | ORAL | 0 refills | Status: DC | PRN
Start: 2022-12-01 — End: 2023-01-04

## 2022-12-01 NOTE — Telephone Encounter (Signed)
sent 

## 2022-12-01 NOTE — Telephone Encounter (Signed)
Prescription Request  12/01/2022  LOV: 09/16/2022  What is the name of the medication or equipment? hydrocodone  Have you contacted your pharmacy to request a refill? Yes   Which pharmacy would you like this sent to?  [id number] [place], [place] - [address] [address] Phone: [phone] Fax: [phone]     Patient notified that their request is being sent to the clinical staff for review and that they should receive a response within 2 business days.   Please advise at Mobile [phone] (mobile)

## 2022-12-20 ENCOUNTER — Encounter: Payer: Self-pay | Admitting: Internal Medicine

## 2022-12-20 DIAGNOSIS — N183 Chronic kidney disease, stage 3 unspecified: Secondary | ICD-10-CM | POA: Insufficient documentation

## 2022-12-20 DIAGNOSIS — N1831 Chronic kidney disease, stage 3a: Secondary | ICD-10-CM | POA: Insufficient documentation

## 2022-12-20 NOTE — Patient Instructions (Addendum)
Flu immunization administered today.     Blood work was ordered.   The lab is on the first floor.    Medications changes include :   None      Return in about 3 months (around 03/23/2023) for chronic pain management.   Health Maintenance, Female Adopting a healthy lifestyle and getting preventive care are important in promoting health and wellness. Ask your health care provider about: The right schedule for you to have regular tests and exams. Things you can do on your own to prevent diseases and keep yourself healthy. What should I know about diet, weight, and exercise? Eat a healthy diet  Eat a diet that includes plenty of vegetables, fruits, low-fat dairy products, and lean protein. Do not eat a lot of foods that are high in solid fats, added sugars, or sodium. Maintain a healthy weight Body mass index (BMI) is used to identify weight problems. It estimates body fat based on height and weight. Your health care provider can help determine your BMI and help you achieve or maintain a healthy weight. Get regular exercise Get regular exercise. This is one of the most important things you can do for your health. Most adults should: Exercise for at least 150 minutes each week. The exercise should increase your heart rate and make you sweat (moderate-intensity exercise). Do strengthening exercises at least twice a week. This is in addition to the moderate-intensity exercise. Spend less time sitting. Even light physical activity can be beneficial. Watch cholesterol and blood lipids Have your blood tested for lipids and cholesterol at 81 years of age, then have this test every 5 years. Have your cholesterol levels checked more often if: Your lipid or cholesterol levels are high. You are older than 81 years of age. You are at high risk for heart disease. What should I know about cancer screening? Depending on your health history and family history, you may need to have cancer  screening at various ages. This may include screening for: Breast cancer. Cervical cancer. Colorectal cancer. Skin cancer. Lung cancer. What should I know about heart disease, diabetes, and high blood pressure? Blood pressure and heart disease High blood pressure causes heart disease and increases the risk of stroke. This is more likely to develop in people who have high blood pressure readings or are overweight. Have your blood pressure checked: Every 3-5 years if you are 30-50 years of age. Every year if you are 84 years old or older. Diabetes Have regular diabetes screenings. This checks your fasting blood sugar level. Have the screening done: Once every three years after age 19 if you are at a normal weight and have a low risk for diabetes. More often and at a younger age if you are overweight or have a high risk for diabetes. What should I know about preventing infection? Hepatitis B If you have a higher risk for hepatitis B, you should be screened for this virus. Talk with your health care provider to find out if you are at risk for hepatitis B infection. Hepatitis C Testing is recommended for: Everyone born from [birth year] through 1964. Anyone with known risk factors for hepatitis C. Sexually transmitted infections (STIs) Get screened for STIs, including gonorrhea and chlamydia, if: You are sexually active and are younger than 81 years of age. You are older than 81 years of age and your health care provider tells you that you are at risk for this type of infection. Your sexual activity has changed  since you were last screened, and you are at increased risk for chlamydia or gonorrhea. Ask your health care provider if you are at risk. Ask your health care provider about whether you are at high risk for HIV. Your health care provider may recommend a prescription medicine to help prevent HIV infection. If you choose to take medicine to prevent HIV, you should first get tested for HIV. You  should then be tested every 3 months for as long as you are taking the medicine. Pregnancy If you are about to stop having your period (premenopausal) and you may become pregnant, seek counseling before you get pregnant. Take 400 to 800 micrograms (mcg) of folic acid every day if you become pregnant. Ask for birth control (contraception) if you want to prevent pregnancy. Osteoporosis and menopause Osteoporosis is a disease in which the bones lose minerals and strength with aging. This can result in bone fractures. If you are 21 years old or older, or if you are at risk for osteoporosis and fractures, ask your health care provider if you should: Be screened for bone loss. Take a calcium or vitamin D supplement to lower your risk of fractures. Be given hormone replacement therapy (HRT) to treat symptoms of menopause. Follow these instructions at home: Alcohol use Do not drink alcohol if: Your health care provider tells you not to drink. You are pregnant, may be pregnant, or are planning to become pregnant. If you drink alcohol: Limit how much you have to: 0-1 drink a day. Know how much alcohol is in your drink. In the [place] one drink equals one 12 oz bottle of beer (355 mL), one 5 oz glass of wine (148 mL), or one 1 oz glass of hard liquor (44 mL). Lifestyle Do not use any products that contain nicotine or tobacco. These products include cigarettes, chewing tobacco, and vaping devices, such as e-cigarettes. If you need help quitting, ask your health care provider. Do not use street drugs. Do not share needles. Ask your health care provider for help if you need support or information about quitting drugs. General instructions Schedule regular health, dental, and eye exams. Stay current with your vaccines. Tell your health care provider if: You often feel depressed. You have ever been abused or do not feel safe at home. Summary Adopting a healthy lifestyle and getting preventive care are  important in promoting health and wellness. Follow your health care provider's instructions about healthy diet, exercising, and getting tested or screened for diseases. Follow your health care provider's instructions on monitoring your cholesterol and blood pressure. This information is not intended to replace advice given to you by your health care provider. Make sure you discuss any questions you have with your health care provider. Document Revised: 07/15/2020 Document Reviewed: 07/15/2020 Elsevier Patient Education  2024 [employer].

## 2022-12-20 NOTE — Progress Notes (Unsigned)
Subjective:    Patient ID: Tonya [name], female    DOB: [birth year], 82 y.o.   MRN: [id number]      HPI Tonya [name] is here for a Physical exam and her chronic medical problems.    Gets up 2-3 times a night to urinate.  Has been taking benadryl some to help her sleep.    Medications and allergies reviewed with patient and updated if appropriate.  Current Outpatient Medications on File Prior to Visit  Medication Sig Dispense Refill   budesonide-formoterol (SYMBICORT) 80-4.5 MCG/ACT inhaler TAKE 2 PUFFS BY MOUTH TWICE A DAY 30.6 each 4   Calcium Citrate 250 MG TABS Take 1 tablet by mouth daily.      cycloSPORINE (RESTASIS) 0.05 % ophthalmic emulsion Place 1 drop into both eyes 2 (two) times daily.     dimenhyDRINATE (DRAMAMINE) 50 MG tablet Take 50 mg by mouth every 8 (eight) hours as needed for nausea.     DULoxetine (CYMBALTA) 30 MG capsule Take 1 capsule (30 mg total) by mouth daily. 90 capsule 2   HYDROcodone-acetaminophen (NORCO) 10-325 MG tablet Take 2 tablets by mouth 2 (two) times daily as needed (chronic lower back and neck pain). 120 tablet 0   levalbuterol (XOPENEX HFA) 45 MCG/ACT inhaler Inhale 1-2 puffs into the lungs every 4 (four) hours as needed for wheezing. 1 each 8   nebivolol (BYSTOLIC) 2.5 MG tablet TAKE 1 TABLET BY MOUTH EVERY DAY 90 tablet 1   omeprazole (PRILOSEC) 40 MG capsule Take 1 capsule (40 mg total) by mouth daily. 90 capsule 3   oxybutynin (DITROPAN-XL) 10 MG 24 hr tablet TAKE 1 TABLET BY MOUTH EVERYDAY AT BEDTIME 90 tablet 1   SYNTHROID 50 MCG tablet TAKE 1 TABLET BY MOUTH EVERY DAY BEFORE BREAKFAST 90 tablet 1   telmisartan (MICARDIS) 40 MG tablet TAKE 1 TABLET BY MOUTH EVERY DAY 90 tablet 1   No current facility-administered medications on file prior to visit.    Review of Systems  Constitutional:  Negative for fever.  Eyes:  Negative for visual disturbance.  Respiratory:  Positive for cough (occ). Negative for shortness of breath and  wheezing.   Cardiovascular:  Negative for chest pain, palpitations and leg swelling.  Gastrointestinal:  Positive for nausea (occ). Negative for abdominal pain, blood in stool, constipation and diarrhea.       GERD occ  Genitourinary:  Negative for dysuria.  Musculoskeletal:  Positive for arthralgias, back pain (chronic) and neck pain.  Skin:  Negative for rash.       Ridges in nails  Neurological:  Negative for light-headedness and headaches.  Hematological:  Bruises/bleeds easily.  Psychiatric/Behavioral:  Positive for dysphoric mood and sleep disturbance. The patient is nervous/anxious.        Objective:   Vitals:   12/21/22 1353  BP: 124/78  Pulse: 68  Temp: 98 F (36.7 C)  SpO2: 94%   Filed Weights   12/21/22 1353  Weight: 97 lb (44 kg)   Body mass index is 21.75 kg/m.  BP Readings from Last 3 Encounters:  12/21/22 124/78  09/16/22 136/60  06/24/22 108/72    Wt Readings from Last 3 Encounters:  12/21/22 97 lb (44 kg)  09/16/22 96 lb 12.8 oz (43.9 kg)  06/24/22 99 lb 6.4 oz (45.1 kg)       Physical Exam Constitutional: She appears well-developed and well-nourished. No distress.  HENT:  Head: Normocephalic and atraumatic.  Right Ear: External ear normal. Normal ear canal  and TM Left Ear: External ear normal.  Normal ear canal and TM Mouth/Throat: Oropharynx is clear and moist.  Eyes: Conjunctivae normal.  Neck: Neck supple. No tracheal deviation present. No thyromegaly present.  No carotid bruit  Cardiovascular: Normal rate, regular rhythm and normal heart sounds.   No murmur heard.  No edema. Pulmonary/Chest: Effort normal and breath sounds normal. No respiratory distress. She has no wheezes. She has no rales.  Breast: deferred   Abdominal: Soft. She exhibits no distension. There is no tenderness.  Lymphadenopathy: She has no cervical adenopathy.  Skin: Skin is warm and dry. She is not diaphoretic.  Psychiatric: She has a normal mood and affect. Her  behavior is normal.     Lab Results  Component Value Date   WBC 6.8 06/24/2022   HGB 12.2 06/24/2022   HCT 36.9 06/24/2022   PLT 194.0 06/24/2022   GLUCOSE 90 07/14/2022   CHOL 199 06/20/2021   TRIG 54.0 06/20/2021   HDL 86.20 06/20/2021   LDLCALC 102 (H) 06/20/2021   ALT 12 06/24/2022   AST 17 06/24/2022   NA 140 07/14/2022   K 4.4 07/14/2022   CL 104 07/14/2022   CREATININE 0.91 07/14/2022   BUN 21 07/14/2022   CO2 28 07/14/2022   TSH 18.83 (H) 06/24/2022   HGBA1C 6.2 06/24/2022   MICROALBUR 1.2 03/15/2020         Assessment & Plan:   Physical exam: Screening blood work  ordered Exercise  none-encouraged regular exercise Weight  on light side - stable Substance abuse  none   Reviewed recommended immunizations.  Flu immunization administered today.     Health Maintenance  Topic Date Due   INFLUENZA VACCINE  10/08/2022   COVID-19 Vaccine (6 - 2023-24 season) 01/06/2023 (Originally 11/08/2022)   DTaP/Tdap/Td (2 - Tdap) 12/21/2023 (Originally 03/09/2021)   Medicare Annual Wellness (AWV)  02/25/2023   DEXA SCAN  08/23/2024   Pneumonia Vaccine 29+ Years old  Completed   Zoster Vaccines- Shingrix  Completed   HPV VACCINES  Aged Out   Hepatitis C Screening  Discontinued          See Problem List for Assessment and Plan of chronic medical problems.

## 2022-12-21 ENCOUNTER — Ambulatory Visit (INDEPENDENT_AMBULATORY_CARE_PROVIDER_SITE_OTHER): Payer: Medicare Other | Admitting: Internal Medicine

## 2022-12-21 VITALS — BP 124/78 | HR 68 | Temp 98.0°F | Ht <= 58 in | Wt 97.0 lb

## 2022-12-21 DIAGNOSIS — J4530 Mild persistent asthma, uncomplicated: Secondary | ICD-10-CM | POA: Diagnosis not present

## 2022-12-21 DIAGNOSIS — F3289 Other specified depressive episodes: Secondary | ICD-10-CM

## 2022-12-21 DIAGNOSIS — R7303 Prediabetes: Secondary | ICD-10-CM

## 2022-12-21 DIAGNOSIS — G8929 Other chronic pain: Secondary | ICD-10-CM

## 2022-12-21 DIAGNOSIS — N3281 Overactive bladder: Secondary | ICD-10-CM

## 2022-12-21 DIAGNOSIS — E039 Hypothyroidism, unspecified: Secondary | ICD-10-CM

## 2022-12-21 DIAGNOSIS — F419 Anxiety disorder, unspecified: Secondary | ICD-10-CM

## 2022-12-21 DIAGNOSIS — R350 Frequency of micturition: Secondary | ICD-10-CM | POA: Diagnosis not present

## 2022-12-21 DIAGNOSIS — D692 Other nonthrombocytopenic purpura: Secondary | ICD-10-CM

## 2022-12-21 DIAGNOSIS — R6889 Other general symptoms and signs: Secondary | ICD-10-CM

## 2022-12-21 DIAGNOSIS — Z0000 Encounter for general adult medical examination without abnormal findings: Secondary | ICD-10-CM | POA: Diagnosis not present

## 2022-12-21 DIAGNOSIS — N1831 Chronic kidney disease, stage 3a: Secondary | ICD-10-CM | POA: Diagnosis not present

## 2022-12-21 DIAGNOSIS — I10 Essential (primary) hypertension: Secondary | ICD-10-CM | POA: Diagnosis not present

## 2022-12-21 DIAGNOSIS — K219 Gastro-esophageal reflux disease without esophagitis: Secondary | ICD-10-CM

## 2022-12-21 DIAGNOSIS — D649 Anemia, unspecified: Secondary | ICD-10-CM

## 2022-12-21 DIAGNOSIS — Z23 Encounter for immunization: Secondary | ICD-10-CM

## 2022-12-21 DIAGNOSIS — M545 Low back pain, unspecified: Secondary | ICD-10-CM

## 2022-12-21 LAB — CBC WITH DIFFERENTIAL/PLATELET
Basophils Absolute: 0.1 10*3/uL (ref 0.0–0.1)
Basophils Relative: 0.9 % (ref 0.0–3.0)
Eosinophils Absolute: 0.2 10*3/uL (ref 0.0–0.7)
Eosinophils Relative: 3 % (ref 0.0–5.0)
HCT: 38.2 % (ref 36.0–46.0)
Hemoglobin: 12.1 g/dL (ref 12.0–15.0)
Lymphocytes Relative: 41.7 % (ref 12.0–46.0)
Lymphs Abs: 2.8 10*3/uL (ref 0.7–4.0)
MCHC: 31.7 g/dL (ref 30.0–36.0)
MCV: 95.6 fL (ref 78.0–100.0)
Monocytes Absolute: 0.8 10*3/uL (ref 0.1–1.0)
Monocytes Relative: 11.7 % (ref 3.0–12.0)
Neutro Abs: 2.8 10*3/uL (ref 1.4–7.7)
Neutrophils Relative %: 42.7 % — ABNORMAL LOW (ref 43.0–77.0)
Platelets: 208 10*3/uL (ref 150.0–400.0)
RBC: 3.99 Mil/uL (ref 3.87–5.11)
RDW: 14.4 % (ref 11.5–15.5)
WBC: 6.6 10*3/uL (ref 4.0–10.5)

## 2022-12-21 LAB — URINALYSIS, ROUTINE W REFLEX MICROSCOPIC
Bilirubin Urine: NEGATIVE
Hgb urine dipstick: NEGATIVE
Ketones, ur: NEGATIVE
Leukocytes,Ua: NEGATIVE
Nitrite: NEGATIVE
Specific Gravity, Urine: 1.02 (ref 1.000–1.030)
Total Protein, Urine: NEGATIVE
Urine Glucose: NEGATIVE
Urobilinogen, UA: 0.2 (ref 0.0–1.0)
pH: 6 (ref 5.0–8.0)

## 2022-12-21 LAB — LIPID PANEL
Cholesterol: 230 mg/dL — ABNORMAL HIGH (ref 0–200)
HDL: 77.3 mg/dL (ref >39.00)
LDL Cholesterol: 132 mg/dL — ABNORMAL HIGH (ref 0–99)
NonHDL: 152.99
Total CHOL/HDL Ratio: 3
Triglycerides: 106 mg/dL (ref 0.0–149.0)
VLDL: 21.2 mg/dL (ref 0.0–40.0)

## 2022-12-21 LAB — COMPREHENSIVE METABOLIC PANEL
ALT: 15 U/L (ref 0–35)
AST: 20 U/L (ref 0–37)
Albumin: 4.4 g/dL (ref 3.5–5.2)
Alkaline Phosphatase: 69 U/L (ref 39–117)
BUN: 38 mg/dL — ABNORMAL HIGH (ref 6–23)
CO2: 26 meq/L (ref 19–32)
Calcium: 9.6 mg/dL (ref 8.4–10.5)
Chloride: 102 meq/L (ref 96–112)
Creatinine, Ser: 1.21 mg/dL — ABNORMAL HIGH (ref 0.40–1.20)
GFR: 42.19 mL/min — ABNORMAL LOW (ref >60.00)
Glucose, Bld: 88 mg/dL (ref 70–99)
Potassium: 4.2 meq/L (ref 3.5–5.1)
Sodium: 137 meq/L (ref 135–145)
Total Bilirubin: 0.5 mg/dL (ref 0.2–1.2)
Total Protein: 7.3 g/dL (ref 6.0–8.3)

## 2022-12-21 LAB — MICROALBUMIN / CREATININE URINE RATIO
Creatinine,U: 128.1 mg/dL
Microalb Creat Ratio: 1.8 mg/g (ref 0.0–30.0)
Microalb, Ur: 2.2 mg/dL — ABNORMAL HIGH (ref 0.0–1.9)

## 2022-12-21 LAB — TSH: TSH: 6.34 u[IU]/mL — ABNORMAL HIGH (ref 0.35–5.50)

## 2022-12-21 LAB — HEMOGLOBIN A1C: Hgb A1c MFr Bld: 6 % (ref 4.6–6.5)

## 2022-12-21 NOTE — Assessment & Plan Note (Signed)
Chronic Mild CMP, CBC Advised avoiding dehydration, NSAIDs

## 2022-12-21 NOTE — Assessment & Plan Note (Signed)
Chronic Blood pressure well controlled CMP Continue nebivolol 2.5 mg daily, telmisartan 40 mg daily

## 2022-12-21 NOTE — Assessment & Plan Note (Signed)
Chronic Lab Results  Component Value Date   HGBA1C 6.2 06/24/2022   Check a1c Low sugar / carb diet Stressed regular exercise

## 2022-12-21 NOTE — Assessment & Plan Note (Signed)
Easy bruises Reassured

## 2022-12-21 NOTE — Assessment & Plan Note (Addendum)
Chronic Gets noises in her throat - may happen 2/week Not related to breathing, movement or eating Likely related to GI gurgling/peristalsis Reassured I do not think this is anything concerning

## 2022-12-21 NOTE — Assessment & Plan Note (Signed)
Chronic Pain management for chronic back pain She is taking her pain medication appropriately and it is controlling her pain and making her functional No side effects [place] controlled substance database checked Continue Norco 10-325 mg 1-2 tabs twice daily as needed

## 2022-12-21 NOTE — Assessment & Plan Note (Signed)
Chronic GERD controlled Continue omeprazole 40 mg daily

## 2022-12-21 NOTE — Assessment & Plan Note (Signed)
Chronic ?Controlled, stable ?Mild, persistent ?Continue symbicort bid, xopenex as needed ? ?

## 2022-12-21 NOTE — Assessment & Plan Note (Signed)
History of anemia Check CBC

## 2022-12-21 NOTE — Assessment & Plan Note (Signed)
Chronic Controlled, Stable Continue Cymbalta 30 mg daily

## 2022-12-21 NOTE — Assessment & Plan Note (Signed)
Chronic Pain overall controlled with current regimen She is taking medication appropriately and the medication is helping her function to a higher level [place] controlled substance database checked Continue Norco 10-325 mg 1-2 tabs twice daily as needed Follow-up in 3 months

## 2022-12-21 NOTE — Assessment & Plan Note (Addendum)
Subacute Increase in frequency - ? Normal or not Check ua, ucx Advise decreasing fluids in the evening

## 2022-12-21 NOTE — Assessment & Plan Note (Signed)
Chronic  Clinically euthyroid Currently taking Synthroid 50 mcg daily  Check TSH-will titrate medication dose as needed

## 2022-12-21 NOTE — Assessment & Plan Note (Signed)
Chronic Continue oxybutynin XL 10 mg nightly

## 2022-12-22 DIAGNOSIS — Z23 Encounter for immunization: Secondary | ICD-10-CM | POA: Diagnosis not present

## 2022-12-22 LAB — URINE CULTURE: Result:: NO GROWTH

## 2022-12-22 NOTE — Addendum Note (Signed)
Addended by: Karma Ganja on: 12/22/2022 08:04 AM   Modules accepted: Orders

## 2022-12-24 MED ORDER — SYNTHROID 75 MCG PO TABS: 75.0000 ug | ORAL_TABLET | Freq: Every day | ORAL | 1 refills | Status: DC

## 2022-12-24 NOTE — Addendum Note (Signed)
Addended by: Pincus Sanes on: 12/24/2022 08:28 PM   Modules accepted: Orders

## 2023-01-04 ENCOUNTER — Telehealth: Payer: Self-pay | Admitting: Internal Medicine

## 2023-01-04 DIAGNOSIS — G8929 Other chronic pain: Secondary | ICD-10-CM

## 2023-01-04 MED ORDER — HYDROCODONE-ACETAMINOPHEN 10-325 MG PO TABS: 2.0000 | ORAL_TABLET | Freq: Two times a day (BID) | ORAL | 0 refills | Status: DC | PRN

## 2023-01-04 NOTE — Telephone Encounter (Signed)
Prescription Request  01/04/2023  LOV: 12/21/2022  What is the name of the medication or equipment? hydrocodone  Have you contacted your pharmacy to request a refill? Yes   Which pharmacy would you like this sent to?  [id number] [place], [place] - [address] [place] [address] Phone: [phone] Fax: [phone]     Patient notified that their request is being sent to the clinical staff for review and that they should receive a response within 2 business days.   Please advise at Mobile [phone] (mobile)

## 2023-01-07 ENCOUNTER — Other Ambulatory Visit: Payer: Self-pay | Admitting: Internal Medicine

## 2023-02-08 ENCOUNTER — Telehealth: Payer: Self-pay | Admitting: Internal Medicine

## 2023-02-08 DIAGNOSIS — G8929 Other chronic pain: Secondary | ICD-10-CM

## 2023-02-08 MED ORDER — HYDROCODONE-ACETAMINOPHEN 10-325 MG PO TABS: 2.0000 | ORAL_TABLET | Freq: Two times a day (BID) | ORAL | 0 refills | Status: DC | PRN

## 2023-02-08 NOTE — Telephone Encounter (Signed)
Prescription Request  02/08/2023  LOV: 12/21/2022  What is the name of the medication or equipment? hydrocodone  Have you contacted your pharmacy to request a refill? Yes   Which pharmacy would you like this sent to?  [id number] - [place], [place] - [address] [place] [address] Phone: [phone] Fax: [phone]   Patient notified that their request is being sent to the clinical staff for review and that they should receive a response within 2 business days.   Please advise at Mobile [phone] (mobile)

## 2023-03-11 ENCOUNTER — Other Ambulatory Visit: Payer: Self-pay | Admitting: Internal Medicine

## 2023-03-11 DIAGNOSIS — G8929 Other chronic pain: Secondary | ICD-10-CM

## 2023-03-11 NOTE — Telephone Encounter (Signed)
 Copied from CRM [id number]. Topic: Clinical - Medication Refill >> Mar 11, 2023  2:36 PM Corin V wrote: Most Recent Primary Care Visit:  Provider: BURNS, GLADE PARAS  Department: [facility]  Visit Type: PHYSICAL  Date: 12/21/2022  Medication: HYDROcodone -acetaminophen  (NORCO) 10-325 MG tablet  Has the patient contacted their pharmacy? Yes (Agent: If no, request that the patient contact the pharmacy for the refill. If patient does not wish to contact the pharmacy document the reason why and proceed with request.) (Agent: If yes, when and what did the pharmacy advise?)  Is this the correct pharmacy for this prescription?  If no, delete pharmacy and type the correct one.  This is the patient's preferred pharmacy:  [id number] - [place], [place] - [address] [place] [address] Phone: [phone] Fax: [phone]  [id number] - [address], [place] - [address] [place] [address] Phone: [phone] Fax: [phone]   Has the prescription been filled recently?   Is the patient out of the medication?   Has the patient been seen for an appointment in the last year OR does the patient have an upcoming appointment?   Can we respond through MyChart?   Agent: Please be advised that Rx refills may take up to 3 business days. We ask that you follow-up with your pharmacy.

## 2023-03-12 MED ORDER — HYDROCODONE-ACETAMINOPHEN 10-325 MG PO TABS: 2.0000 | ORAL_TABLET | Freq: Two times a day (BID) | ORAL | 0 refills | Status: DC | PRN

## 2023-03-22 ENCOUNTER — Encounter: Payer: Self-pay | Admitting: Internal Medicine

## 2023-03-22 NOTE — Progress Notes (Signed)
 Subjective:    Patient ID: Tonya [name], female    DOB: 09/16/1940, 83 y.o.   MRN: [id number]     HPI Tonya [name] is here for follow up of her chronic medical problems.   Mouth has been very dry and urinating more - 3-4 times a night.  Drinks cran-apple juice - does not drink water.  Usually she is not urinating that much-just the past few days.  She does not limit her fluids in the evening.   Medications and allergies reviewed with patient and updated if appropriate.  Current Outpatient Medications on File Prior to Visit  Medication Sig Dispense Refill   budesonide -formoterol  (SYMBICORT ) 80-4.5 MCG/ACT inhaler TAKE 2 PUFFS BY MOUTH TWICE A DAY 30.6 each 4   Calcium Citrate 250 MG TABS Take 1 tablet by mouth daily.      cycloSPORINE (RESTASIS) 0.05 % ophthalmic emulsion Place 1 drop into both eyes 2 (two) times daily.     dimenhyDRINATE (DRAMAMINE) 50 MG tablet Take 50 mg by mouth every 8 (eight) hours as needed for nausea.     DULoxetine  (CYMBALTA ) 30 MG capsule Take 1 capsule (30 mg total) by mouth daily. 90 capsule 2   HYDROcodone -acetaminophen  (NORCO) 10-325 MG tablet Take 2 tablets by mouth 2 (two) times daily as needed (chronic lower back and neck pain). 120 tablet 0   levalbuterol  (XOPENEX  HFA) 45 MCG/ACT inhaler Inhale 1-2 puffs into the lungs every 4 (four) hours as needed for wheezing. 1 each 8   nebivolol  (BYSTOLIC ) 2.5 MG tablet TAKE 1 TABLET BY MOUTH EVERY DAY 90 tablet 1   omeprazole  (PRILOSEC) 40 MG capsule Take 1 capsule (40 mg total) by mouth daily. 90 capsule 3   oxybutynin  (DITROPAN -XL) 10 MG 24 hr tablet TAKE 1 TABLET BY MOUTH EVERYDAY AT BEDTIME 90 tablet 1   SYNTHROID  50 MCG tablet TAKE 1 TABLET BY MOUTH EVERY DAY BEFORE BREAKFAST 30 tablet 2   SYNTHROID  75 MCG tablet Take 1 tablet (75 mcg total) by mouth daily before breakfast. 90 tablet 1   telmisartan  (MICARDIS ) 40 MG tablet TAKE 1 TABLET BY MOUTH EVERY DAY 90 tablet 1   No current  facility-administered medications on file prior to visit.     Review of Systems  Constitutional:  Negative for fever.  HENT:         Dry mouth   Respiratory:  Negative for cough, shortness of breath and wheezing.   Cardiovascular:  Negative for chest pain, palpitations and leg swelling.  Gastrointestinal:        Occ gerd  Genitourinary:  Positive for frequency. Negative for dysuria, hematuria and urgency.  Neurological:  Negative for light-headedness and headaches.       Objective:   Vitals:   03/23/23 1455 03/23/23 1523  BP: (!) 140/78 122/72  Pulse: 62   Temp: 97.8 F (36.6 C)   SpO2: 94%    BP Readings from Last 3 Encounters:  03/23/23 122/72  12/21/22 124/78  09/16/22 136/60   Wt Readings from Last 3 Encounters:  03/23/23 96 lb 12.8 oz (43.9 kg)  12/21/22 97 lb (44 kg)  09/16/22 96 lb 12.8 oz (43.9 kg)   Body mass index is 21.7 kg/m.    Physical Exam Constitutional:      General: She is not in acute distress.    Appearance: Normal appearance.  HENT:     Head: Normocephalic and atraumatic.  Eyes:     Conjunctiva/sclera: Conjunctivae normal.  Cardiovascular:  Rate and Rhythm: Normal rate and regular rhythm.     Heart sounds: Normal heart sounds.  Pulmonary:     Effort: Pulmonary effort is normal. No respiratory distress.     Breath sounds: Normal breath sounds. No wheezing.  Musculoskeletal:     Cervical back: Neck supple.     Right lower leg: No edema.     Left lower leg: No edema.  Lymphadenopathy:     Cervical: No cervical adenopathy.  Skin:    General: Skin is warm and dry.     Findings: No rash.  Neurological:     Mental Status: She is alert. Mental status is at baseline.  Psychiatric:        Mood and Affect: Mood normal.        Behavior: Behavior normal.        Lab Results  Component Value Date   WBC 6.6 12/21/2022   HGB 12.1 12/21/2022   HCT 38.2 12/21/2022   PLT 208.0 12/21/2022   GLUCOSE 88 12/21/2022   CHOL 230 (H)  12/21/2022   TRIG 106.0 12/21/2022   HDL 77.30 12/21/2022   LDLCALC 132 (H) 12/21/2022   ALT 15 12/21/2022   AST 20 12/21/2022   NA 137 12/21/2022   K 4.2 12/21/2022   CL 102 12/21/2022   CREATININE 1.21 (H) 12/21/2022   BUN 38 (H) 12/21/2022   CO2 26 12/21/2022   TSH 6.34 (H) 12/21/2022   HGBA1C 6.0 12/21/2022   MICROALBUR 2.2 (H) 12/21/2022     Assessment & Plan:    See Problem List for Assessment and Plan of chronic medical problems.

## 2023-03-22 NOTE — Patient Instructions (Addendum)
      Blood work was ordered.       Medications changes include :   None      Return in about 3 months (around 06/21/2023) for follow up.

## 2023-03-23 ENCOUNTER — Ambulatory Visit (INDEPENDENT_AMBULATORY_CARE_PROVIDER_SITE_OTHER): Payer: Medicare Other | Admitting: Internal Medicine

## 2023-03-23 VITALS — BP 122/72 | HR 62 | Temp 97.8°F | Ht <= 58 in | Wt 96.8 lb

## 2023-03-23 DIAGNOSIS — E039 Hypothyroidism, unspecified: Secondary | ICD-10-CM | POA: Diagnosis not present

## 2023-03-23 DIAGNOSIS — K219 Gastro-esophageal reflux disease without esophagitis: Secondary | ICD-10-CM

## 2023-03-23 DIAGNOSIS — I10 Essential (primary) hypertension: Secondary | ICD-10-CM

## 2023-03-23 DIAGNOSIS — G8929 Other chronic pain: Secondary | ICD-10-CM

## 2023-03-23 DIAGNOSIS — F419 Anxiety disorder, unspecified: Secondary | ICD-10-CM | POA: Diagnosis not present

## 2023-03-23 DIAGNOSIS — J4530 Mild persistent asthma, uncomplicated: Secondary | ICD-10-CM

## 2023-03-23 DIAGNOSIS — M545 Low back pain, unspecified: Secondary | ICD-10-CM

## 2023-03-23 DIAGNOSIS — R7303 Prediabetes: Secondary | ICD-10-CM

## 2023-03-23 DIAGNOSIS — F3289 Other specified depressive episodes: Secondary | ICD-10-CM

## 2023-03-23 DIAGNOSIS — N1831 Chronic kidney disease, stage 3a: Secondary | ICD-10-CM

## 2023-03-23 DIAGNOSIS — N3281 Overactive bladder: Secondary | ICD-10-CM

## 2023-03-23 LAB — TSH: TSH: 0.24 u[IU]/mL — ABNORMAL LOW (ref 0.35–5.50)

## 2023-03-23 NOTE — Assessment & Plan Note (Signed)
 Chronic Blood pressure well controlled BMP Continue nebivolol 2.5 mg daily, telmisartan 40 mg daily

## 2023-03-23 NOTE — Assessment & Plan Note (Signed)
 Chronic Lab Results  Component Value Date   HGBA1C 6.0 12/21/2022   Low sugar / carb diet Stressed regular exercise

## 2023-03-23 NOTE — Assessment & Plan Note (Signed)
 Chronic Pain management for chronic back pain which she has had for years [place]  controlled substance database checked [place] up to date Pain contract re-signed today She is taking her pain medication appropriately and it is controlling her pain and making her functional No side effects from pain medication Continue Norco 10-325 mg 1-2 tabs twice daily as needed Follow up in 3 months

## 2023-03-23 NOTE — Assessment & Plan Note (Signed)
 Chronic Recently has had increased urination at night but this is not usual for her Discussed possibly decreasing fluids in the evening Discussed that we could consider changing her medication or having her see a specialist if her symptoms continue Continue oxybutynin  XL 10 mg nightly

## 2023-03-23 NOTE — Assessment & Plan Note (Signed)
Chronic Controlled, Stable Continue Cymbalta 30 mg daily

## 2023-03-23 NOTE — Assessment & Plan Note (Signed)
Chronic ?Controlled, stable ?Mild, persistent ?Continue symbicort bid, xopenex as needed ? ?

## 2023-03-23 NOTE — Assessment & Plan Note (Signed)
 Chronic Mild BMP

## 2023-03-23 NOTE — Assessment & Plan Note (Signed)
 Chronic GERD controlled - occ gerd Continue omeprazole 40 mg daily

## 2023-03-23 NOTE — Assessment & Plan Note (Signed)
 Chronic  Clinically euthyroid Currently taking Synthroid 75 mcg daily  Check TSH-will titrate medication dose as needed

## 2023-03-23 NOTE — Assessment & Plan Note (Signed)
Chronic Pain overall controlled with current regimen She is taking medication appropriately and the medication is helping her function to a higher level [place] controlled substance database checked Continue Norco 10-325 mg 1-2 tabs twice daily as needed Follow-up in 3 months

## 2023-03-24 ENCOUNTER — Other Ambulatory Visit: Payer: Self-pay | Admitting: Internal Medicine

## 2023-03-24 LAB — BASIC METABOLIC PANEL
BUN: 29 mg/dL — ABNORMAL HIGH (ref 6–23)
CO2: 28 meq/L (ref 19–32)
Calcium: 9.8 mg/dL (ref 8.4–10.5)
Chloride: 101 meq/L (ref 96–112)
Creatinine, Ser: 1.04 mg/dL (ref 0.40–1.20)
GFR: 50.5 mL/min — ABNORMAL LOW (ref >60.00)
Glucose, Bld: 101 mg/dL — ABNORMAL HIGH (ref 70–99)
Potassium: 4.4 meq/L (ref 3.5–5.1)
Sodium: 138 meq/L (ref 135–145)

## 2023-03-24 MED ORDER — SYNTHROID 75 MCG PO TABS: ORAL_TABLET | ORAL | Status: DC

## 2023-03-30 DIAGNOSIS — D2272 Melanocytic nevi of left lower limb, including hip: Secondary | ICD-10-CM | POA: Diagnosis not present

## 2023-03-30 DIAGNOSIS — L821 Other seborrheic keratosis: Secondary | ICD-10-CM | POA: Diagnosis not present

## 2023-03-30 DIAGNOSIS — D692 Other nonthrombocytopenic purpura: Secondary | ICD-10-CM | POA: Diagnosis not present

## 2023-03-30 DIAGNOSIS — Z808 Family history of malignant neoplasm of other organs or systems: Secondary | ICD-10-CM | POA: Diagnosis not present

## 2023-03-30 DIAGNOSIS — L570 Actinic keratosis: Secondary | ICD-10-CM | POA: Diagnosis not present

## 2023-03-31 ENCOUNTER — Other Ambulatory Visit: Payer: Self-pay | Admitting: Internal Medicine

## 2023-04-15 ENCOUNTER — Telehealth: Payer: Self-pay | Admitting: Internal Medicine

## 2023-04-15 ENCOUNTER — Other Ambulatory Visit: Payer: Self-pay | Admitting: Internal Medicine

## 2023-04-15 DIAGNOSIS — G8929 Other chronic pain: Secondary | ICD-10-CM

## 2023-04-15 MED ORDER — HYDROCODONE-ACETAMINOPHEN 10-325 MG PO TABS: 2.0000 | ORAL_TABLET | Freq: Two times a day (BID) | ORAL | 0 refills | Status: DC | PRN

## 2023-04-15 NOTE — Telephone Encounter (Signed)
 Copied from CRM [id number]. Topic: Clinical - Medication Refill >> Apr 15, 2023 12:24 PM Deidre DASEN wrote: Most Recent Primary Care Visit:  Provider: GEOFM GLADE PARAS  Department: [facility]  Visit Type: OFFICE VISIT  Date: 03/23/2023  Medication: HYDROcodone -acetaminophen  (NORCO) 10-325 MG tablet  Has the patient contacted their pharmacy? Yes (Agent: If no, request that the patient contact the pharmacy for the refill. If patient does not wish to contact the pharmacy document the reason why and proceed with request.) (Agent: If yes, when and what did the pharmacy advise?)  Is this the correct pharmacy for this prescription? Yes If no, delete pharmacy and type the correct one.  This is the patient's preferred pharmacy:  [id number] - [place], [place] - [address] [place]  [address] Phone: [phone] Fax: [phone]  [id number] [address] [place] - [address] [place] [address] Phone: [phone] Fax: [phone]   Has the prescription been filled recently? No  Is the patient out of the medication? Yes  Has the patient been seen for an appointment in the last year OR does the patient have an upcoming appointment? Yes  Can we respond through MyChart? No  Agent: Please be advised that Rx refills may take up to 3 business days. We ask that you follow-up with your pharmacy.

## 2023-04-15 NOTE — Telephone Encounter (Signed)
 Copied from CRM [id number]. Topic: Clinical - Medication Refill >> Apr 15, 2023 12:28 PM Robinson H wrote: Most Recent Primary Care Visit:  Provider: BURNS, GLADE PARAS  Department: [facility]  Visit Type: OFFICE VISIT  Date: 03/23/2023  Medication: HYDROcodone -acetaminophen  (NORCO) 10-325 MG tablet  Has the patient contacted their pharmacy? Yes (Agent: If no, request that the patient contact the pharmacy for the refill. If patient does not wish to contact the pharmacy document the reason why and proceed with request.) (Agent: If yes, when and what did the pharmacy advise?)  Is this the correct pharmacy for this prescription?  If no, delete pharmacy and type the correct one.  This is the patient's preferred pharmacy:  [id number] [place], [place] - [address] [place] [address] Phone: [phone] Fax: [phone]    Has the prescription been filled recently? No  Is the patient out of the medication? No  Has the patient been seen for an appointment in the last year OR does the patient have an upcoming appointment? Yes  Can we respond through MyChart? Yes  Agent: Please be advised that Rx refills may take up to 3 business days. We ask that you follow-up with your pharmacy.

## 2023-04-15 NOTE — Telephone Encounter (Signed)
 sent

## 2023-04-24 ENCOUNTER — Other Ambulatory Visit: Payer: Self-pay | Admitting: Internal Medicine

## 2023-05-06 IMAGING — MG MM DIGITAL SCREENING BILAT W/ TOMO AND CAD
8 series · 9 of 24 positions shown · non-contrast
Comparison: Previous exam(s).

CLINICAL DATA: Screening.

EXAM:
DIGITAL SCREENING BILATERAL MAMMOGRAM WITH TOMOSYNTHESIS AND CAD
TECHNIQUE: Bilateral screening digital craniocaudal and mediolateral oblique
mammograms were obtained. Bilateral screening digital breast
tomosynthesis was performed. The images were evaluated with
computer-aided detection.

[R CC synth-2D]
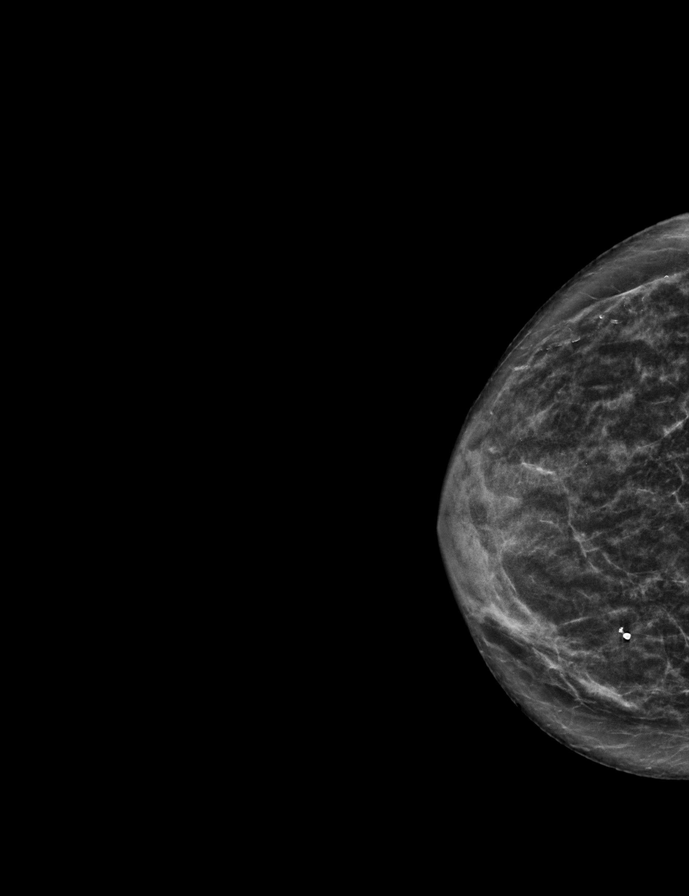

[R MLO synth-2D]
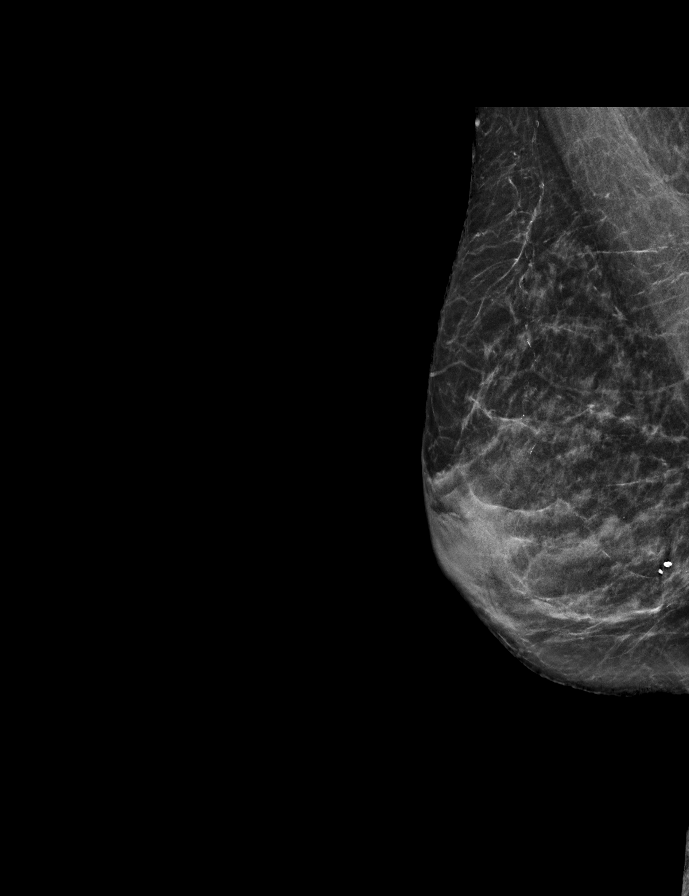

[L CC synth-2D]
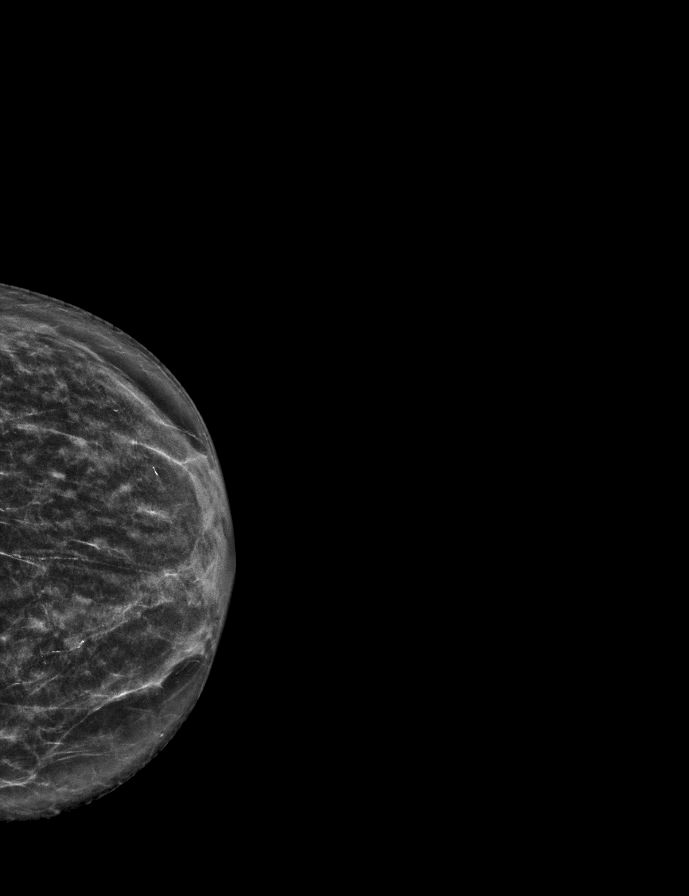

[L MLO synth-2D]
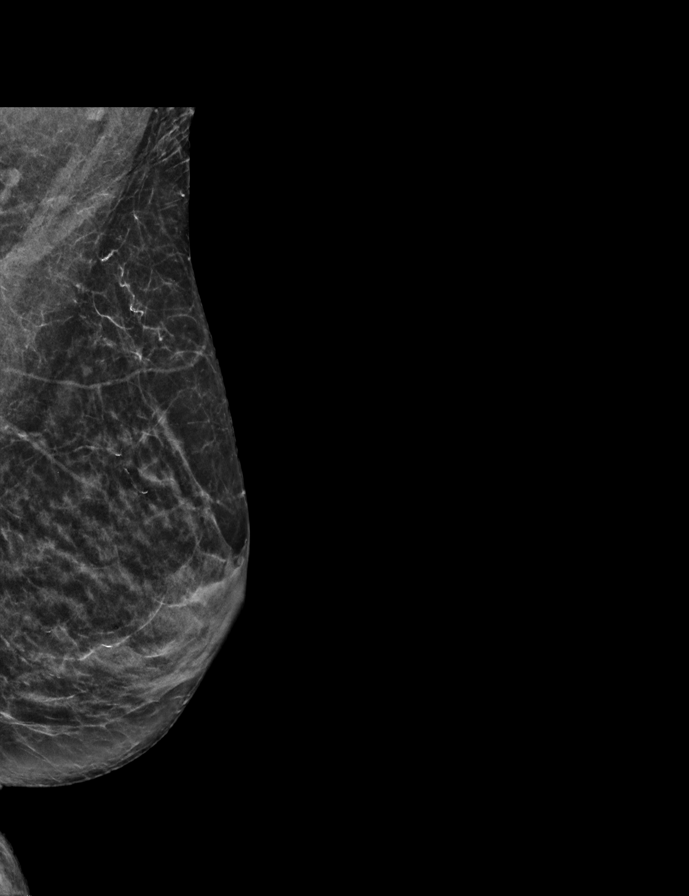

[L MLO tomo · 2 of 52 frames shown]
[frame 17/52]
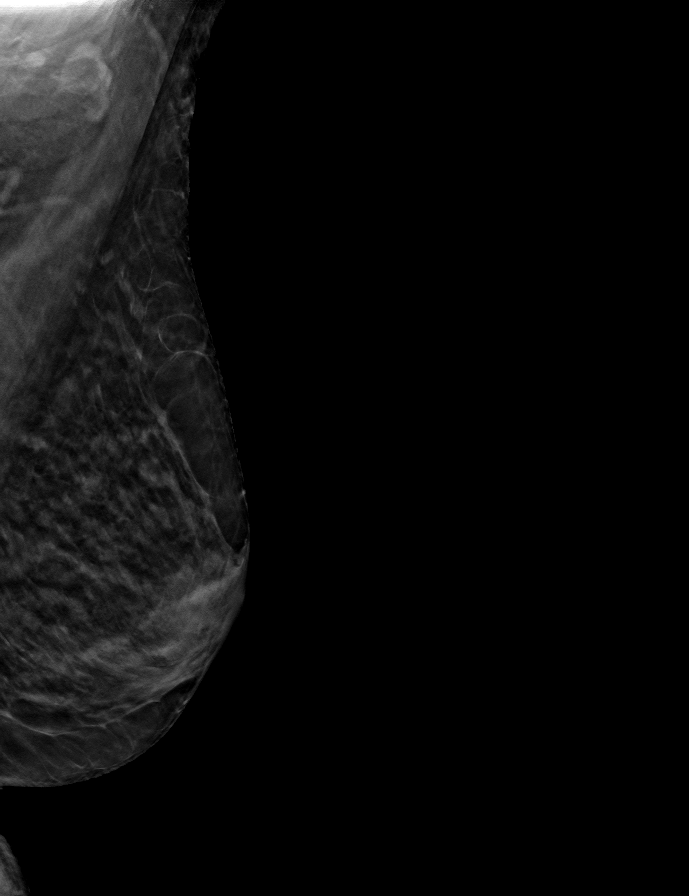
[frame 27/52]
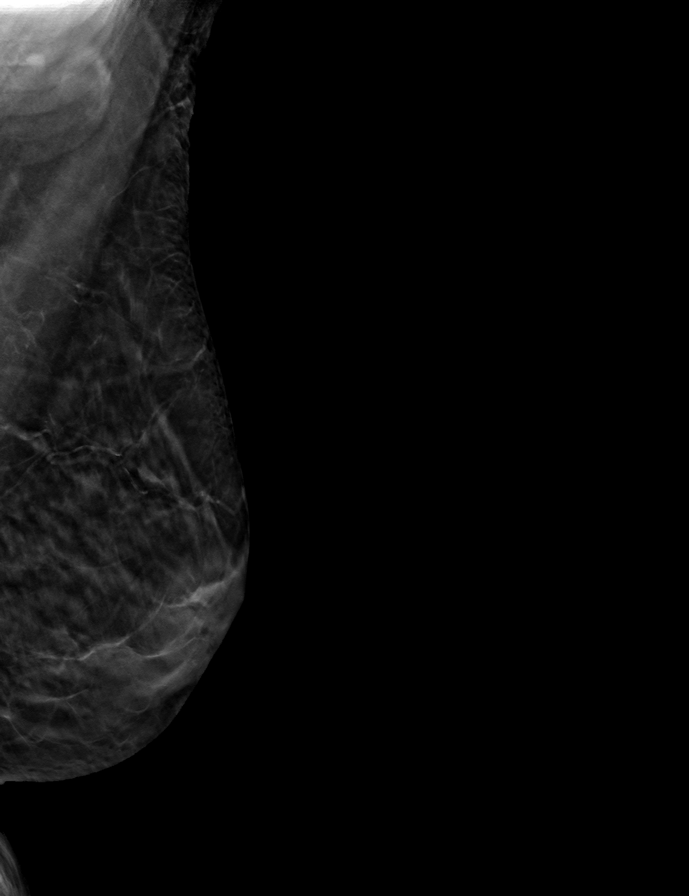

[R MLO tomo · tomo slice 29/57.0]
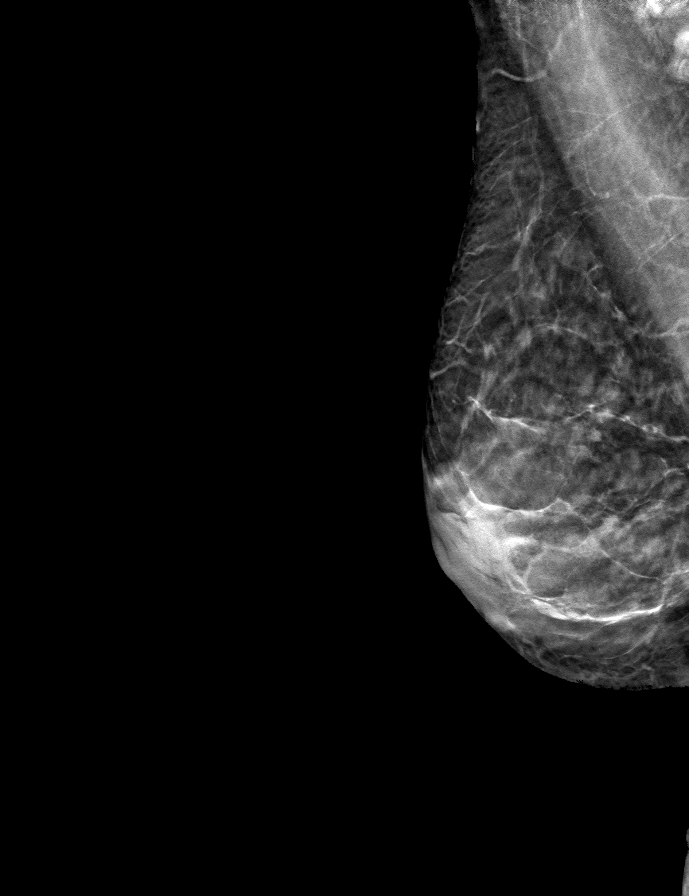

[L CC tomo · tomo slice 25/49.0]
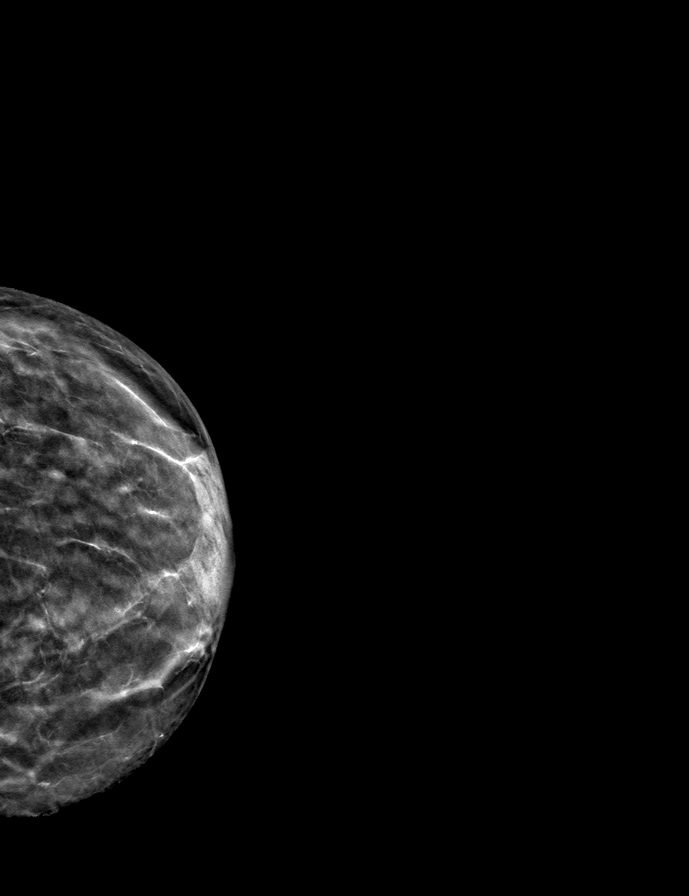

[R CC tomo · tomo slice 29/57.0]
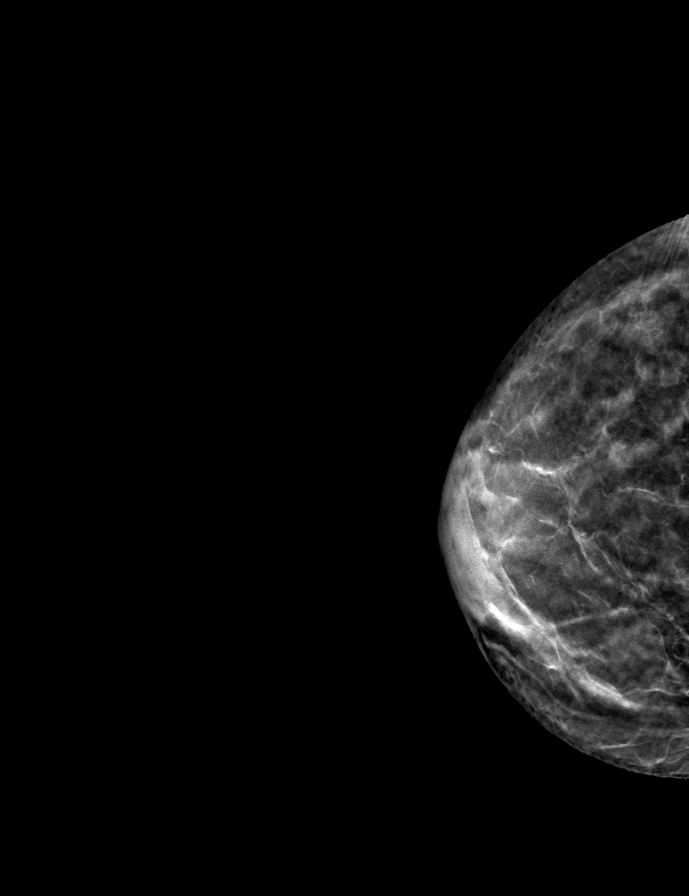

[9 of 24 positions shown; findings below may reference images not displayed]

ACR Breast Density Category c: The breast tissue is heterogeneously
dense, which may obscure small masses.
FINDINGS: There are no findings suspicious for malignancy. The images were
evaluated with computer-aided detection.
IMPRESSION: No mammographic evidence of malignancy. A result letter of this
screening mammogram will be mailed directly to the patient.

RECOMMENDATION:
Screening mammogram in one year. (Code:T4-5-GWO)

BI-RADS CATEGORY  1: Negative.

## 2023-05-13 ENCOUNTER — Other Ambulatory Visit: Payer: Self-pay | Admitting: Internal Medicine

## 2023-05-16 ENCOUNTER — Other Ambulatory Visit: Payer: Self-pay | Admitting: Internal Medicine

## 2023-05-21 ENCOUNTER — Other Ambulatory Visit: Payer: Self-pay | Admitting: Internal Medicine

## 2023-05-21 NOTE — Telephone Encounter (Unsigned)
 Copied from CRM [id number]. Topic: Clinical - Medication Refill >> May 21, 2023  3:27 PM Martha Clan wrote: Most Recent Primary Care Visit:  Provider: Pincus Sanes  Department: [facility]  Visit Type: OFFICE VISIT  Date: 03/23/2023  Medication: HYDROcodone-acetaminophen (NORCO) 10-325 MG tablet [[id number]]  Has the patient contacted their pharmacy? Yes (Agent: If no, request that the patient contact the pharmacy for the refill. If patient does not wish to contact the pharmacy document the reason why and proceed with request.) (Agent: If yes, when and what did the pharmacy advise?)  Is this the correct pharmacy for this prescription? Yes If no, delete pharmacy and type the correct one.  This is the patient's preferred pharmacy:  [id number] [place], [place] - [address] [place] [address] Phone: [phone] Fax: [phone]   Has the prescription been filled recently? No  Is the patient out of the medication? Yes  Has the patient been seen for an appointment in the last year OR does the patient have an upcoming appointment? Yes  Can we respond through MyChart? Yes, would prefer call  Agent: Please be advised that Rx refills may take up to 3 business days. We ask that you follow-up with your pharmacy.

## 2023-05-25 ENCOUNTER — Other Ambulatory Visit: Payer: Self-pay | Admitting: Internal Medicine

## 2023-05-25 DIAGNOSIS — G8929 Other chronic pain: Secondary | ICD-10-CM

## 2023-05-25 NOTE — Telephone Encounter (Signed)
 Copied from CRM [id number]. Topic: Clinical - Medication Refill >> May 25, 2023  1:33 PM Desma Mcgregor wrote: Most Recent Primary Care Visit:  Provider: BURNS, Bobette Mo  Department: [facility]  Visit Type: OFFICE VISIT  Date: 03/23/2023  Medication: HYDROcodone-acetaminophen (NORCO) 10-325 MG tablet   Has the patient contacted their pharmacy? Yes, No refills   Is this the correct pharmacy for this prescription? Yes If no, delete pharmacy and type the correct one.  This is the patient's preferred pharmacy:  [id number] [place], [place] - [address] [place] [address] Phone: [phone] Fax: [phone]  Has the prescription been filled recently? Yes  Is the patient out of the medication? Yes, been out for 2 days  Has the patient been seen for an appointment in the last year OR does the patient have an upcoming appointment? Yes  Can we respond through MyChart? Yes, but prefers a call  Agent: Please be advised that Rx refills may take up to 3 business days. We ask that you follow-up with your pharmacy.  This request was submitted prior, but a med was never pended. Patient needs this med asap.

## 2023-05-26 MED ORDER — HYDROCODONE-ACETAMINOPHEN 10-325 MG PO TABS: 2.0000 | ORAL_TABLET | Freq: Two times a day (BID) | ORAL | 0 refills | Status: DC | PRN

## 2023-06-07 ENCOUNTER — Ambulatory Visit (INDEPENDENT_AMBULATORY_CARE_PROVIDER_SITE_OTHER): Payer: 59

## 2023-06-07 VITALS — Ht <= 58 in | Wt 96.0 lb

## 2023-06-07 DIAGNOSIS — Z1231 Encounter for screening mammogram for malignant neoplasm of breast: Secondary | ICD-10-CM

## 2023-06-07 DIAGNOSIS — Z0000 Encounter for general adult medical examination without abnormal findings: Secondary | ICD-10-CM

## 2023-06-07 NOTE — Patient Instructions (Signed)
 Tonya [name] , Thank you for taking time to come for your Medicare Wellness Visit. I appreciate your ongoing commitment to your health goals. Please review the following plan we discussed and let me know if I can assist you in the future.   Referrals/Orders/Follow-Ups/Clinician Recommendations: It was nice talking with you today.   Each day, aim for 6 glasses of water, plenty of protein in your diet and try to get up and walk/ stretch every hour for 5-10 minutes at a time.    This is a list of the screening recommended for you and due dates:  Health Maintenance  Topic Date Due   COVID-19 Vaccine (6 - 2024-25 season) 11/08/2022   DTaP/Tdap/Td vaccine (2 - Tdap) 12/21/2023*   Medicare Annual Wellness Visit  06/06/2024   DEXA scan (bone density measurement)  08/23/2024   Pneumonia Vaccine  Completed   Flu Shot  Completed   Zoster (Shingles) Vaccine  Completed   HPV Vaccine  Aged Out   Hepatitis C Screening  Discontinued  *Topic was postponed. The date shown is not the original due date.    Advanced directives: (Copy Requested) Please bring a copy of your health care power of attorney and living will to the office to be added to your chart at your convenience. You can mail to [facility] [address] [place], [place] [address] or email to [email or web address]  Next Medicare Annual Wellness Visit scheduled for next year: Yes

## 2023-06-07 NOTE — Progress Notes (Signed)
 Subjective:   Tonya [name] is a 83 y.o. who presents for a Medicare Wellness preventive visit.  Visit Complete: Virtual I connected with  Pierre Bali on 06/07/23 by a audio enabled telemedicine application and verified that I am speaking with the correct person using two identifiers.  Patient Location: Home  Provider Location: Home Office  I discussed the limitations of evaluation and management by telemedicine. The patient expressed understanding and agreed to proceed.  Vital Signs: Because this visit was a virtual/telehealth visit, some criteria may be missing or patient reported. Any vitals not documented were not able to be obtained and vitals that have been documented are patient reported.  VideoDeclined- This patient declined Librarian, academic. Therefore the visit was completed with audio only.  Persons Participating in Visit: Patient.  AWV Questionnaire: No: Patient Medicare AWV questionnaire was not completed prior to this visit.  Cardiac Risk Factors include: advanced age (>55men, >80 women);hypertension;Other (see comment), Risk factor comments: Asthma, CKD     Objective:    Today's Vitals   06/07/23 1333  Weight: 96 lb (43.5 kg)  Height: 4\' 8"  (1.422 m)  PainSc: 5    Body mass index is 21.52 kg/m.     06/07/2023    1:39 PM 02/24/2022    1:37 PM 02/21/2021    1:19 PM 12/15/2019   10:03 AM 10/03/2018    5:01 PM 08/17/2015    3:45 PM  Advanced Directives  Does Patient Have a Medical Advance Directive? Yes Yes Yes Yes Yes Yes  Type of Estate agent of [place];Living will Healthcare Power of [place];Living will Healthcare Power of [place];Living will Healthcare Power of [place];Living will Healthcare Power of [place];Living will;Out of facility DNR (pink MOST or yellow form) Healthcare Power of [place];Living will  Does patient want to make changes to medical advance directive?    No - Patient  declined No - Patient declined   Copy of Healthcare Power of Attorney in Chart? No - copy requested No - copy requested No - copy requested No - copy requested No - copy requested     Current Medications (verified) Outpatient Encounter Medications as of 06/07/2023  Medication Sig   budesonide-formoterol (SYMBICORT) 80-4.5 MCG/ACT inhaler TAKE 2 PUFFS BY MOUTH TWICE A DAY   Calcium Citrate 250 MG TABS Take 1 tablet by mouth daily.    cycloSPORINE (RESTASIS) 0.05 % ophthalmic emulsion Place 1 drop into both eyes 2 (two) times daily.   dimenhyDRINATE (DRAMAMINE) 50 MG tablet Take 50 mg by mouth every 8 (eight) hours as needed for nausea.   DULoxetine (CYMBALTA) 30 MG capsule TAKE 1 CAPSULE BY MOUTH EVERY DAY   HYDROcodone-acetaminophen (NORCO) 10-325 MG tablet Take 2 tablets by mouth 2 (two) times daily as needed (chronic lower back and neck pain).   levalbuterol (XOPENEX HFA) 45 MCG/ACT inhaler Inhale 1-2 puffs into the lungs every 4 (four) hours as needed for wheezing.   nebivolol (BYSTOLIC) 2.5 MG tablet TAKE 1 TABLET BY MOUTH EVERY DAY   omeprazole (PRILOSEC) 40 MG capsule TAKE 1 CAPSULE (40 MG TOTAL) BY MOUTH DAILY.   oxybutynin (DITROPAN-XL) 10 MG 24 hr tablet TAKE 1 TABLET BY MOUTH EVERYDAY AT BEDTIME   SYNTHROID 75 MCG tablet TAKE 1 TABLET BY MOUTH DAILY BEFORE BREAKFAST.   telmisartan (MICARDIS) 40 MG tablet TAKE 1 TABLET BY MOUTH EVERY DAY   No facility-administered encounter medications on file as of 06/07/2023.    Allergies (verified) Prochlorperazine edisylate and Other  History: Past Medical History:  Diagnosis Date   Anemia    Asthma    Back pain, chronic    Diverticulosis    H/O arthrodesis 11/22/2012   History of hysterectomy    Hypertension    Spinal stenosis    Synovial cyst    Past Surgical History:  Procedure Laterality Date   ABDOMINAL HYSTERECTOMY     BACK SURGERY     CYST REMOVAL TRUNK     Family History  Problem Relation Age of Onset   Other Father         carotid artery disease   Heart disease Brother    Alcohol abuse Brother    Breast cancer Neg Hx    Social History   Socioeconomic History   Marital status: Widowed    Spouse name: Not on file   Number of children: 2   Years of education: Not on file   Highest education level: Not on file  Occupational History   Not on file  Tobacco Use   Smoking status: Never   Smokeless tobacco: Never  Vaping Use   Vaping status: Never Used  Substance and Sexual Activity   Alcohol use: Yes    Comment: socially   Drug use: No   Sexual activity: Not on file  Other Topics Concern   Not on file  Social History Narrative   Not currently exercising   Lives alone   Social Drivers of Health   Financial Resource Strain: Medium Risk (06/07/2023)   Overall Financial Resource Strain (CARDIA)    Difficulty of Paying Living Expenses: Somewhat hard  Food Insecurity: No Food Insecurity (06/07/2023)   Hunger Vital Sign    Worried About Running Out of Food in the Last Year: Never true    Ran Out of Food in the Last Year: Never true  Transportation Needs: No Transportation Needs (06/07/2023)   PRAPARE - Administrator, Civil Service (Medical): No    Lack of Transportation (Non-Medical): No  Physical Activity: Inactive (06/07/2023)   Exercise Vital Sign    Days of Exercise per Week: 0 days    Minutes of Exercise per Session: 0 min  Stress: No Stress Concern Present (06/07/2023)   [employer] of Occupational Health - Occupational Stress Questionnaire    Feeling of Stress : Not at all  Social Connections: Moderately Isolated (06/07/2023)   Social Connection and Isolation Panel [NHANES]    Frequency of Communication with Friends and Family: More than three times a week    Frequency of Social Gatherings with Friends and Family: Never    Attends Religious Services: More than 4 times per year    Active Member of [employer] or Organizations: No    Attends Banker Meetings:  Never    Marital Status: Widowed    Tobacco Counseling Counseling given: Not Answered    Clinical Intake:  Pre-visit preparation completed: Yes  Pain : 0-10 Pain Score: 5  Pain Type: Chronic pain Pain Location: Back Pain Orientation: Lower Pain Descriptors / Indicators: Aching, Discomfort Pain Onset: More than a month ago Pain Frequency: Constant Pain Relieving Factors: Hydrocodone  Pain Relieving Factors: Hydrocodone  BMI - recorded: 21.52 Nutritional Status: BMI of 19-24  Normal Nutritional Risks: None  Lab Results  Component Value Date   HGBA1C 6.0 12/21/2022   HGBA1C 6.2 06/24/2022   HGBA1C 6.2 09/23/2021     How often do you need to have someone help you when you read instructions, pamphlets, or  other written materials from your doctor or pharmacy?: 1 - Never  Interpreter Needed?: No  Information entered by :: Keywon Mestre, RMA   Activities of Daily Living     06/07/2023    1:36 PM  In your present state of health, do you have any difficulty performing the following activities:  Hearing? 0  Vision? 0  Difficulty concentrating or making decisions? 0  Walking or climbing stairs? 0  Dressing or bathing? 0  Doing errands, shopping? 0  Preparing Food and eating ? N  Using the Toilet? N  In the past six months, have you accidently leaked urine? Y  Do you have problems with loss of bowel control? N  Managing your Medications? N  Managing your Finances? N  Housekeeping or managing your Housekeeping? N    Patient Care Team: Pincus Sanes, MD as PCP - General (Internal Medicine)  Indicate any recent Medical Services you may have received from other than [facility] providers in the past year (date may be approximate).     Assessment:   This is a routine wellness examination for Jasiah.  Hearing/Vision screen Hearing Screening - Comments:: Per pt- rt ear total loss of hearing Vision Screening - Comments:: Wears eyeglasses   Goals Addressed              This Visit's Progress    Patient Stated       Get up to 100 pounds.  Still working on it/2025       Depression Screen     06/07/2023    1:50 PM 03/23/2023    2:58 PM 03/25/2022    3:34 PM 02/24/2022    1:43 PM 09/23/2021    4:11 PM 02/21/2021    1:22 PM 02/21/2021    1:17 PM  PHQ 2/9 Scores  PHQ - 2 Score 0 0 0 0 2 0 0  PHQ- 9 Score 3    10      Fall Risk     06/07/2023    1:41 PM 03/23/2023    2:58 PM 03/25/2022    3:34 PM 02/24/2022    1:39 PM 09/23/2021    4:12 PM  Fall Risk   Falls in the past year? 0 0 0 0 1  Number falls in past yr: 0 0 0 0 1  Injury with Fall? 0 0 0 0 0  Risk for fall due to : No Fall Risks No Fall Risks No Fall Risks No Fall Risks No Fall Risks  Follow up Falls prevention discussed;Falls evaluation completed Falls evaluation completed Falls evaluation completed Falls prevention discussed Falls evaluation completed    MEDICARE RISK AT HOME:  Medicare Risk at Home Any stairs in or around the home?: Yes If so, are there any without handrails?: Yes Home free of loose throw rugs in walkways, pet beds, electrical cords, etc?: Yes Adequate lighting in your home to reduce risk of falls?: Yes Life alert?: No Grab bars in the bathroom?: Yes Shower chair or bench in shower?: Yes Elevated toilet seat or a handicapped toilet?: Yes  TIMED UP AND GO:  Was the test performed?  No  Cognitive Function: 6CIT completed        06/07/2023    1:42 PM 02/24/2022    1:44 PM  6CIT Screen  What Year? 0 points 0 points  What month? 0 points 0 points  What time? 0 points 0 points  Count back from 20 0 points 0 points  Months in reverse 0  points 0 points  Repeat phrase 0 points 0 points  Total Score 0 points 0 points    Immunizations Immunization History  Administered Date(s) Administered   Fluad Trivalent(High Dose 65+) 12/22/2022   Influenza, High Dose Seasonal PF 12/06/2016, 11/21/2017, 11/30/2018, 02/07/2020   Influenza-Unspecified 12/08/2015,  12/07/2016, 02/18/2021, 12/24/2021   PFIZER(Purple Top)SARS-COV-2 Vaccination 04/14/2019, 05/05/2019, 12/25/2019   Pfizer Covid-19 Vaccine Bivalent Booster 33yrs & up 03/20/2021   Pfizer(Comirnaty)Fall Seasonal Vaccine 12 years and older 04/28/2022   Pneumococcal Conjugate-13 06/02/2012   Pneumococcal Polysaccharide-23 01/26/2013   Respiratory Syncytial Virus Vaccine,Recomb Aduvanted(Arexvy) 03/12/2022   Td 03/10/2011   Zoster Recombinant(Shingrix) 10/08/2016, 05/05/2017    Screening Tests Health Maintenance  Topic Date Due   COVID-19 Vaccine (6 - 2024-25 season) 11/08/2022   DTaP/Tdap/Td (2 - Tdap) 12/21/2023 (Originally 03/09/2021)   Medicare Annual Wellness (AWV)  06/06/2024   DEXA SCAN  08/23/2024   Pneumonia Vaccine 1+ Years old  Completed   INFLUENZA VACCINE  Completed   Zoster Vaccines- Shingrix  Completed   HPV VACCINES  Aged Out   Hepatitis C Screening  Discontinued    Health Maintenance  Health Maintenance Due  Topic Date Due   COVID-19 Vaccine (6 - 2024-25 season) 11/08/2022   Health Maintenance Items Addressed: Mammogram ordered, See Nurse Notes  Additional Screening:  Vision Screening: Recommended annual ophthalmology exams for early detection of glaucoma and other disorders of the eye.  Dental Screening: Recommended annual dental exams for proper oral hygiene  Community Resource Referral / Chronic Care Management: CRR required this visit?  No   CCM required this visit?  No     Plan:     I have personally reviewed and noted the following in the patient's chart:   Medical and social history Use of alcohol, tobacco or illicit drugs  Current medications and supplements including opioid prescriptions. Patient is not currently taking opioid prescriptions. Functional ability and status Nutritional status Physical activity Advanced directives List of other physicians Hospitalizations, surgeries, and ER visits in previous 12 months Vitals Screenings  to include cognitive, depression, and falls Referrals and appointments  In addition, I have reviewed and discussed with patient certain preventive protocols, quality metrics, and best practice recommendations. A written personalized care plan for preventive services as well as general preventive health recommendations were provided to patient.     Jerrod Damiano L Breindy Meadow, CMA   06/07/2023   After Visit Summary: (MyChart) Due to this being a telephonic visit, the after visit summary with patients personalized plan was offered to patient via MyChart   Notes: Please refer to Routing Comments.

## 2023-06-21 ENCOUNTER — Ambulatory Visit: Payer: 59 | Admitting: Internal Medicine

## 2023-06-22 ENCOUNTER — Ambulatory Visit (INDEPENDENT_AMBULATORY_CARE_PROVIDER_SITE_OTHER): Payer: 59 | Admitting: Internal Medicine

## 2023-06-22 ENCOUNTER — Encounter: Payer: Self-pay | Admitting: Internal Medicine

## 2023-06-22 VITALS — BP 118/72 | HR 68 | Temp 98.0°F | Ht <= 58 in | Wt 95.6 lb

## 2023-06-22 DIAGNOSIS — K219 Gastro-esophageal reflux disease without esophagitis: Secondary | ICD-10-CM | POA: Diagnosis not present

## 2023-06-22 DIAGNOSIS — N1831 Chronic kidney disease, stage 3a: Secondary | ICD-10-CM

## 2023-06-22 DIAGNOSIS — G8929 Other chronic pain: Secondary | ICD-10-CM

## 2023-06-22 DIAGNOSIS — F419 Anxiety disorder, unspecified: Secondary | ICD-10-CM

## 2023-06-22 DIAGNOSIS — E039 Hypothyroidism, unspecified: Secondary | ICD-10-CM | POA: Diagnosis not present

## 2023-06-22 DIAGNOSIS — R200 Anesthesia of skin: Secondary | ICD-10-CM | POA: Insufficient documentation

## 2023-06-22 DIAGNOSIS — R7303 Prediabetes: Secondary | ICD-10-CM

## 2023-06-22 DIAGNOSIS — E7800 Pure hypercholesterolemia, unspecified: Secondary | ICD-10-CM | POA: Insufficient documentation

## 2023-06-22 DIAGNOSIS — F3289 Other specified depressive episodes: Secondary | ICD-10-CM

## 2023-06-22 DIAGNOSIS — J4530 Mild persistent asthma, uncomplicated: Secondary | ICD-10-CM | POA: Diagnosis not present

## 2023-06-22 DIAGNOSIS — M545 Low back pain, unspecified: Secondary | ICD-10-CM

## 2023-06-22 DIAGNOSIS — M8589 Other specified disorders of bone density and structure, multiple sites: Secondary | ICD-10-CM

## 2023-06-22 DIAGNOSIS — I10 Essential (primary) hypertension: Secondary | ICD-10-CM

## 2023-06-22 DIAGNOSIS — I129 Hypertensive chronic kidney disease with stage 1 through stage 4 chronic kidney disease, or unspecified chronic kidney disease: Secondary | ICD-10-CM | POA: Diagnosis not present

## 2023-06-22 DIAGNOSIS — N3281 Overactive bladder: Secondary | ICD-10-CM

## 2023-06-22 DIAGNOSIS — E785 Hyperlipidemia, unspecified: Secondary | ICD-10-CM | POA: Insufficient documentation

## 2023-06-22 LAB — LIPID PANEL
Cholesterol: 185 mg/dL (ref 0–200)
HDL: 68.8 mg/dL (ref >39.00)
LDL Cholesterol: 95 mg/dL (ref 0–99)
NonHDL: 116.58
Total CHOL/HDL Ratio: 3
Triglycerides: 109 mg/dL (ref 0.0–149.0)
VLDL: 21.8 mg/dL (ref 0.0–40.0)

## 2023-06-22 LAB — CBC WITH DIFFERENTIAL/PLATELET
Basophils Absolute: 0 10*3/uL (ref 0.0–0.1)
Basophils Relative: 0.7 % (ref 0.0–3.0)
Eosinophils Absolute: 0.3 10*3/uL (ref 0.0–0.7)
Eosinophils Relative: 4.3 % (ref 0.0–5.0)
HCT: 35 % — ABNORMAL LOW (ref 36.0–46.0)
Hemoglobin: 11.4 g/dL — ABNORMAL LOW (ref 12.0–15.0)
Lymphocytes Relative: 28.5 % (ref 12.0–46.0)
Lymphs Abs: 2 10*3/uL (ref 0.7–4.0)
MCHC: 32.7 g/dL (ref 30.0–36.0)
MCV: 93.1 fl (ref 78.0–100.0)
Monocytes Absolute: 0.9 10*3/uL (ref 0.1–1.0)
Monocytes Relative: 12 % (ref 3.0–12.0)
Neutro Abs: 3.9 10*3/uL (ref 1.4–7.7)
Neutrophils Relative %: 54.5 % (ref 43.0–77.0)
Platelets: 200 10*3/uL (ref 150.0–400.0)
RBC: 3.76 Mil/uL — ABNORMAL LOW (ref 3.87–5.11)
RDW: 13.8 % (ref 11.5–15.5)
WBC: 7.1 10*3/uL (ref 4.0–10.5)

## 2023-06-22 LAB — COMPREHENSIVE METABOLIC PANEL WITH GFR
ALT: 12 U/L (ref 0–35)
AST: 17 U/L (ref 0–37)
Albumin: 4.2 g/dL (ref 3.5–5.2)
Alkaline Phosphatase: 77 U/L (ref 39–117)
BUN: 25 mg/dL — ABNORMAL HIGH (ref 6–23)
CO2: 27 meq/L (ref 19–32)
Calcium: 9.4 mg/dL (ref 8.4–10.5)
Chloride: 104 meq/L (ref 96–112)
Creatinine, Ser: 0.95 mg/dL (ref 0.40–1.20)
GFR: 56.2 mL/min — ABNORMAL LOW (ref >60.00)
Glucose, Bld: 87 mg/dL (ref 70–99)
Potassium: 4 meq/L (ref 3.5–5.1)
Sodium: 138 meq/L (ref 135–145)
Total Bilirubin: 0.5 mg/dL (ref 0.2–1.2)
Total Protein: 6.6 g/dL (ref 6.0–8.3)

## 2023-06-22 LAB — MICROALBUMIN / CREATININE URINE RATIO
Creatinine,U: 125.9 mg/dL
Microalb Creat Ratio: 19.3 mg/g (ref 0.0–30.0)
Microalb, Ur: 2.4 mg/dL — ABNORMAL HIGH (ref 0.0–1.9)

## 2023-06-22 LAB — HEMOGLOBIN A1C: Hgb A1c MFr Bld: 6.2 % (ref 4.6–6.5)

## 2023-06-22 LAB — TSH: TSH: 0.06 u[IU]/mL — ABNORMAL LOW (ref 0.35–5.50)

## 2023-06-22 NOTE — Assessment & Plan Note (Signed)
 Chronic Mild BMP

## 2023-06-22 NOTE — Assessment & Plan Note (Signed)
 New Has intermittent tingling, asleep feeling, not working well when she first wakes up - shaking her hand improves the symptoms Will monitor for now If persists or worsens can refer  - may need EMG

## 2023-06-22 NOTE — Assessment & Plan Note (Signed)
 Chronic Blood pressure well controlled cmp Continue nebivolol 2.5 mg daily, telmisartan 40 mg daily

## 2023-06-22 NOTE — Assessment & Plan Note (Signed)
 Chronic Pain management for chronic back pain which she has had for years [place]  controlled substance database checked [place] up to date Pain contract re-signed today She is taking her pain medication appropriately and it is controlling her pain and making her functional No side effects from pain medication Continue Norco 10-325 mg 1-2 tabs twice daily as needed Follow up in 3 months

## 2023-06-22 NOTE — Assessment & Plan Note (Signed)
 Chronic Somewhat controlled Continue oxybutynin XL 10 mg nightly

## 2023-06-22 NOTE — Assessment & Plan Note (Signed)
 Chronic Dexa up to date Encouraged regular exercise Continue calcium and vitamin d

## 2023-06-22 NOTE — Assessment & Plan Note (Signed)
Chronic Controlled, Stable Continue Cymbalta 30 mg daily

## 2023-06-22 NOTE — Assessment & Plan Note (Signed)
 Chronic Check lipid panel  Continue lifestyle control Regular exercise and healthy diet encouraged

## 2023-06-22 NOTE — Patient Instructions (Addendum)
      Blood work was ordered.       Medications changes include :   None      Return in about 3 months (around 09/21/2023) for follow up.

## 2023-06-22 NOTE — Progress Notes (Signed)
 Subjective:    Patient ID: Tonya [name], female    DOB: 02/21/1941, 83 y.o.   MRN: [id number]     HPI Chicquita is here for follow up of her chronic medical problems.   Sleeping more in the mornings - she thinks that is because she does not have anything to do.  Still thinking about getting a dog.     Right hand when she wakes up feels a little 'asleep" or not wanted to work  - once she moves it around it is ok.  She has it a little in her lower arm.    Medications and allergies reviewed with patient and updated if appropriate.  Current Outpatient Medications on File Prior to Visit  Medication Sig Dispense Refill   budesonide-formoterol (SYMBICORT) 80-4.5 MCG/ACT inhaler TAKE 2 PUFFS BY MOUTH TWICE A DAY 30.6 each 4   Calcium Citrate 250 MG TABS Take 1 tablet by mouth daily.      cycloSPORINE (RESTASIS) 0.05 % ophthalmic emulsion Place 1 drop into both eyes 2 (two) times daily.     dimenhyDRINATE (DRAMAMINE) 50 MG tablet Take 50 mg by mouth every 8 (eight) hours as needed for nausea.     DULoxetine (CYMBALTA) 30 MG capsule TAKE 1 CAPSULE BY MOUTH EVERY DAY 90 capsule 2   Emollient (DERMEND FRAGILE SKIN) CREA as directed Externally     HYDROcodone-acetaminophen (NORCO) 10-325 MG tablet Take 2 tablets by mouth 2 (two) times daily as needed (chronic lower back and neck pain). 120 tablet 0   levalbuterol (XOPENEX HFA) 45 MCG/ACT inhaler Inhale 1-2 puffs into the lungs every 4 (four) hours as needed for wheezing. 1 each 8   nebivolol (BYSTOLIC) 2.5 MG tablet TAKE 1 TABLET BY MOUTH EVERY DAY 90 tablet 1   omeprazole (PRILOSEC) 40 MG capsule TAKE 1 CAPSULE (40 MG TOTAL) BY MOUTH DAILY. 90 capsule 3   oxybutynin (DITROPAN-XL) 10 MG 24 hr tablet TAKE 1 TABLET BY MOUTH EVERYDAY AT BEDTIME 90 tablet 0   SYNTHROID 75 MCG tablet TAKE 1 TABLET BY MOUTH DAILY BEFORE BREAKFAST. 90 tablet 1   telmisartan (MICARDIS) 40 MG tablet TAKE 1 TABLET BY MOUTH EVERY DAY 90 tablet 1   No current  facility-administered medications on file prior to visit.     Review of Systems  Constitutional:  Negative for fever.  Respiratory:  Positive for shortness of breath (with strenuous exercise). Negative for cough and wheezing.   Cardiovascular:  Negative for chest pain, palpitations and leg swelling.  Genitourinary:  Positive for frequency.  Neurological:  Negative for light-headedness and headaches.       Objective:   Vitals:   06/22/23 1302  BP: 118/72  Pulse: 68  Temp: 98 F (36.7 C)  SpO2: 98%   BP Readings from Last 3 Encounters:  06/22/23 118/72  03/23/23 122/72  12/21/22 124/78   Wt Readings from Last 3 Encounters:  06/22/23 95 lb 9.6 oz (43.4 kg)  06/07/23 96 lb (43.5 kg)  03/23/23 96 lb 12.8 oz (43.9 kg)   Body mass index is 21.43 kg/m.    Physical Exam Constitutional:      General: She is not in acute distress.    Appearance: Normal appearance.  HENT:     Head: Normocephalic and atraumatic.  Eyes:     Conjunctiva/sclera: Conjunctivae normal.  Cardiovascular:     Rate and Rhythm: Normal rate and regular rhythm.     Heart sounds: Normal heart sounds.  Pulmonary:  Effort: Pulmonary effort is normal. No respiratory distress.     Breath sounds: Normal breath sounds. No wheezing.  Musculoskeletal:     Cervical back: Neck supple.     Right lower leg: No edema.     Left lower leg: No edema.  Lymphadenopathy:     Cervical: No cervical adenopathy.  Skin:    General: Skin is warm and dry.     Findings: No rash.  Neurological:     Mental Status: She is alert. Mental status is at baseline.  Psychiatric:        Mood and Affect: Mood normal.        Behavior: Behavior normal.        Lab Results  Component Value Date   WBC 6.6 12/21/2022   HGB 12.1 12/21/2022   HCT 38.2 12/21/2022   PLT 208.0 12/21/2022   GLUCOSE 101 (H) 03/23/2023   CHOL 230 (H) 12/21/2022   TRIG 106.0 12/21/2022   HDL 77.30 12/21/2022   LDLCALC 132 (H) 12/21/2022   ALT 15  12/21/2022   AST 20 12/21/2022   NA 138 03/23/2023   K 4.4 03/23/2023   CL 101 03/23/2023   CREATININE 1.04 03/23/2023   BUN 29 (H) 03/23/2023   CO2 28 03/23/2023   TSH 0.24 (L) 03/23/2023   HGBA1C 6.0 12/21/2022   MICROALBUR 2.2 (H) 12/21/2022     Assessment & Plan:    See Problem List for Assessment and Plan of chronic medical problems.

## 2023-06-22 NOTE — Assessment & Plan Note (Signed)
 Chronic GERD controlled - occ gerd Continue omeprazole 40 mg daily

## 2023-06-22 NOTE — Assessment & Plan Note (Signed)
 Chronic Lab Results  Component Value Date   HGBA1C 6.0 12/21/2022   Low sugar / carb diet Stressed regular exercise

## 2023-06-22 NOTE — Assessment & Plan Note (Signed)
Chronic Pain overall controlled with current regimen She is taking medication appropriately and the medication is helping her function to a higher level [place] controlled substance database checked Continue Norco 10-325 mg 1-2 tabs twice daily as needed Follow-up in 3 months

## 2023-06-22 NOTE — Assessment & Plan Note (Signed)
 Chronic  Clinically euthyroid Currently taking Synthroid 75 mcg daily  Check TSH-will titrate medication dose as needed

## 2023-06-22 NOTE — Assessment & Plan Note (Signed)
Chronic ?Controlled, stable ?Mild, persistent ?Continue symbicort bid, xopenex as needed ? ?

## 2023-06-23 ENCOUNTER — Other Ambulatory Visit: Payer: Self-pay | Admitting: Internal Medicine

## 2023-06-23 ENCOUNTER — Encounter: Payer: Self-pay | Admitting: Internal Medicine

## 2023-06-23 MED ORDER — SYNTHROID 75 MCG PO TABS: 75.0000 ug | ORAL_TABLET | Freq: Every day | ORAL | Status: DC

## 2023-06-29 ENCOUNTER — Other Ambulatory Visit: Payer: Self-pay

## 2023-06-29 DIAGNOSIS — M545 Low back pain, unspecified: Secondary | ICD-10-CM

## 2023-06-30 ENCOUNTER — Other Ambulatory Visit: Payer: Self-pay | Admitting: Internal Medicine

## 2023-06-30 DIAGNOSIS — G8929 Other chronic pain: Secondary | ICD-10-CM

## 2023-06-30 MED ORDER — HYDROCODONE-ACETAMINOPHEN 10-325 MG PO TABS: 2.0000 | ORAL_TABLET | Freq: Two times a day (BID) | ORAL | 0 refills | Status: DC | PRN

## 2023-07-28 ENCOUNTER — Other Ambulatory Visit: Payer: Self-pay | Admitting: Internal Medicine

## 2023-07-28 ENCOUNTER — Other Ambulatory Visit: Payer: Self-pay

## 2023-08-03 ENCOUNTER — Telehealth: Payer: Self-pay | Admitting: Internal Medicine

## 2023-08-03 DIAGNOSIS — M545 Low back pain, unspecified: Secondary | ICD-10-CM

## 2023-08-03 NOTE — Telephone Encounter (Unsigned)
 Copied from CRM [id number]. Topic: Clinical - Medication Refill >> Aug 03, 2023 12:00 PM Chuck Crater wrote: Medication: HYDROcodone -acetaminophen  (NORCO) 10-325 MG tablet  Has the patient contacted their pharmacy? No (Agent: If no, request that the patient contact the pharmacy for the refill. If patient does not wish to contact the pharmacy document the reason why and proceed with request.) (Agent: If yes, when and what did the pharmacy advise?) always advised to call doctor   This is the patient's preferred pharmacy:  [id number] [place],  [place] - [address] [place] [address] Phone: [phone] Fax: [phone]  Is this the correct pharmacy for this prescription? Yes If no, delete pharmacy and type the correct one.   Has the prescription been filled recently? yes  Is the patient out of the medication? Yes  Has the patient been seen for an appointment in the last year OR does the patient have an upcoming appointment? Yes  Can we respond through MyChart? No  Agent: Please be advised that Rx refills may take up to 3 business days. We ask that you follow-up with your pharmacy.

## 2023-08-04 MED ORDER — HYDROCODONE-ACETAMINOPHEN 10-325 MG PO TABS: 2.0000 | ORAL_TABLET | Freq: Two times a day (BID) | ORAL | 0 refills | Status: DC | PRN

## 2023-09-07 ENCOUNTER — Other Ambulatory Visit: Payer: Self-pay | Admitting: Internal Medicine

## 2023-09-07 DIAGNOSIS — G8929 Other chronic pain: Secondary | ICD-10-CM

## 2023-09-07 MED ORDER — HYDROCODONE-ACETAMINOPHEN 10-325 MG PO TABS: 2.0000 | ORAL_TABLET | Freq: Two times a day (BID) | ORAL | 0 refills | Status: DC | PRN

## 2023-09-07 NOTE — Telephone Encounter (Unsigned)
 Copied from CRM [id number]. Topic: Clinical - Medication Refill >> Sep 07, 2023  2:44 PM [place] P wrote: Medication: HYDROcodone -acetaminophen  (NORCO) 10-325 MG tablet  Has the patient contacted their pharmacy? Yes (Agent: If no, request that the patient contact the pharmacy for the refill. If patient does not wish to contact the pharmacy document the reason why and proceed with request.) (Agent: If yes, when and what did the pharmacy advise?)  This is the patient's preferred pharmacy:  [id number] [place], [place] - [address] [place]  [address] Phone: [phone] Fax: [phone]   Is this the correct pharmacy for this prescription? Yes If no, delete pharmacy and type the correct one.   Has the prescription been filled recently? No  Is the patient out of the medication? Has 2 left  Has the patient been seen for an appointment in the last year OR does the patient have an upcoming appointment? Yes  Can we respond through MyChart? Yes  Agent: Please be advised that Rx refills may take up to 3 business days. We ask that you follow-up with your pharmacy.

## 2023-09-17 ENCOUNTER — Other Ambulatory Visit (HOSPITAL_BASED_OUTPATIENT_CLINIC_OR_DEPARTMENT_OTHER): Payer: Self-pay

## 2023-09-25 ENCOUNTER — Other Ambulatory Visit: Payer: Self-pay | Admitting: Internal Medicine

## 2023-09-28 NOTE — Patient Instructions (Addendum)
      Blood work was ordered.       Medications changes include :   None    A referral was ordered and someone will call you to schedule an appointment.     Return in about 3 months (around 12/30/2023) for follow up.

## 2023-09-28 NOTE — Progress Notes (Unsigned)
 Subjective:    Patient ID: Tonya [name], female    DOB: 09/25/1940, 83 y.o.   MRN: [id number]     HPI Suha is here for follow up of her chronic medical problems.  Has not felt great x past month - has been sleeping more during the day and not going out as much.    She had a wasp sting her one night on her right shoulder.  She has redness her upper arm that lasted a few days.     Medications and allergies reviewed with patient and updated if appropriate.  Current Outpatient Medications on File Prior to Visit  Medication Sig Dispense Refill   budesonide -formoterol  (SYMBICORT ) 80-4.5 MCG/ACT inhaler TAKE 2 PUFFS BY MOUTH TWICE A DAY 30.6 each 4   Calcium Citrate 250 MG TABS Take 1 tablet by mouth daily.      cycloSPORINE (RESTASIS) 0.05 % ophthalmic emulsion Place 1 drop into both eyes 2 (two) times daily.     dimenhyDRINATE (DRAMAMINE) 50 MG tablet Take 50 mg by mouth every 8 (eight) hours as needed for nausea.     DULoxetine  (CYMBALTA ) 30 MG capsule TAKE 1 CAPSULE BY MOUTH EVERY DAY 90 capsule 2   Emollient (DERMEND FRAGILE SKIN) CREA as directed Externally     HYDROcodone -acetaminophen  (NORCO) 10-325 MG tablet Take 2 tablets by mouth 2 (two) times daily as needed (chronic lower back and neck pain). 120 tablet 0   levalbuterol  (XOPENEX  HFA) 45 MCG/ACT inhaler Inhale 1-2 puffs into the lungs every 4 (four) hours as needed for wheezing. 1 each 8   nebivolol  (BYSTOLIC ) 2.5 MG tablet TAKE 1 TABLET BY MOUTH EVERY DAY 90 tablet 1   omeprazole  (PRILOSEC) 40 MG capsule TAKE 1 CAPSULE (40 MG TOTAL) BY MOUTH DAILY. 90 capsule 3   oxybutynin  (DITROPAN -XL) 10 MG 24 hr tablet TAKE 1 TABLET BY MOUTH EVERYDAY AT BEDTIME 90 tablet 0   SYNTHROID  75 MCG tablet Take 1 tablet (75 mcg total) by mouth daily before breakfast. Take medication 5 days a week only.     telmisartan  (MICARDIS ) 40 MG tablet TAKE 1 TABLET BY MOUTH EVERY DAY 90 tablet 1   No current facility-administered medications  on file prior to visit.     Review of Systems  Constitutional:  Positive for appetite change (dec). Negative for fever.  Respiratory:  Positive for shortness of breath (chronic -going up hills -  no change). Negative for cough and wheezing.   Cardiovascular:  Negative for chest pain, palpitations and leg swelling.  Gastrointestinal:        [place] controlled  Neurological:  Negative for light-headedness and headaches.       Objective:   Vitals:   09/29/23 1326  BP: (!) 140/70  Pulse: 82  Temp: 98 F (36.7 C)  SpO2: 94%   BP Readings from Last 3 Encounters:  09/29/23 (!) 140/70  06/22/23 118/72  03/23/23 122/72   Wt Readings from Last 3 Encounters:  09/29/23 96 lb (43.5 kg)  06/22/23 95 lb 9.6 oz (43.4 kg)  06/07/23 96 lb (43.5 kg)   Body mass index is 21.52 kg/m.    Physical Exam Constitutional:      General: She is not in acute distress.    Appearance: Normal appearance.  HENT:     Head: Normocephalic and atraumatic.  Eyes:     Conjunctiva/sclera: Conjunctivae normal.  Cardiovascular:     Rate and Rhythm: Normal rate and regular rhythm.  Heart sounds: Normal heart sounds.  Pulmonary:     Effort: Pulmonary effort is normal. No respiratory distress.     Breath sounds: Normal breath sounds. No wheezing.  Musculoskeletal:     Cervical back: Neck supple.     Right lower leg: No edema.     Left lower leg: No edema.  Lymphadenopathy:     Cervical: No cervical adenopathy.  Skin:    General: Skin is warm and dry.     Findings: No rash.  Neurological:     Mental Status: She is alert. Mental status is at baseline.  Psychiatric:        Mood and Affect: Mood normal.        Behavior: Behavior normal.        Lab Results  Component Value Date   WBC 7.1 06/22/2023   HGB 11.4 (L) 06/22/2023   HCT 35.0 (L) 06/22/2023   PLT 200.0 06/22/2023   GLUCOSE 87 06/22/2023   CHOL 185 06/22/2023   TRIG 109.0 06/22/2023   HDL 68.80 06/22/2023   LDLCALC 95 06/22/2023    ALT 12 06/22/2023   AST 17 06/22/2023   NA 138 06/22/2023   K 4.0 06/22/2023   CL 104 06/22/2023   CREATININE 0.95 06/22/2023   BUN 25 (H) 06/22/2023   CO2 27 06/22/2023   TSH 0.06 (L) 06/22/2023   HGBA1C 6.2 06/22/2023   MICROALBUR 2.4 (H) 06/22/2023     Assessment & Plan:    See Problem List for Assessment and Plan of chronic medical problems.

## 2023-09-29 ENCOUNTER — Encounter: Payer: Self-pay | Admitting: Internal Medicine

## 2023-09-29 ENCOUNTER — Ambulatory Visit (INDEPENDENT_AMBULATORY_CARE_PROVIDER_SITE_OTHER): Admitting: Internal Medicine

## 2023-09-29 VITALS — BP 128/72 | HR 82 | Temp 98.0°F | Ht <= 58 in | Wt 96.0 lb

## 2023-09-29 DIAGNOSIS — G8929 Other chronic pain: Secondary | ICD-10-CM

## 2023-09-29 DIAGNOSIS — M545 Low back pain, unspecified: Secondary | ICD-10-CM

## 2023-09-29 DIAGNOSIS — F419 Anxiety disorder, unspecified: Secondary | ICD-10-CM

## 2023-09-29 DIAGNOSIS — N3281 Overactive bladder: Secondary | ICD-10-CM

## 2023-09-29 DIAGNOSIS — J4530 Mild persistent asthma, uncomplicated: Secondary | ICD-10-CM

## 2023-09-29 DIAGNOSIS — N1831 Chronic kidney disease, stage 3a: Secondary | ICD-10-CM

## 2023-09-29 DIAGNOSIS — E7800 Pure hypercholesterolemia, unspecified: Secondary | ICD-10-CM

## 2023-09-29 DIAGNOSIS — F3289 Other specified depressive episodes: Secondary | ICD-10-CM

## 2023-09-29 DIAGNOSIS — E039 Hypothyroidism, unspecified: Secondary | ICD-10-CM

## 2023-09-29 DIAGNOSIS — K219 Gastro-esophageal reflux disease without esophagitis: Secondary | ICD-10-CM

## 2023-09-29 DIAGNOSIS — R7303 Prediabetes: Secondary | ICD-10-CM

## 2023-09-29 DIAGNOSIS — I10 Essential (primary) hypertension: Secondary | ICD-10-CM | POA: Diagnosis not present

## 2023-09-29 LAB — CBC WITH DIFFERENTIAL/PLATELET
Basophils Absolute: 0.1 K/uL (ref 0.0–0.1)
Basophils Relative: 1 % (ref 0.0–3.0)
Eosinophils Absolute: 0.2 K/uL (ref 0.0–0.7)
Eosinophils Relative: 3.4 % (ref 0.0–5.0)
HCT: 34.5 % — ABNORMAL LOW (ref 36.0–46.0)
Hemoglobin: 11.4 g/dL — ABNORMAL LOW (ref 12.0–15.0)
Lymphocytes Relative: 42.6 % (ref 12.0–46.0)
Lymphs Abs: 2.3 K/uL (ref 0.7–4.0)
MCHC: 33 g/dL (ref 30.0–36.0)
MCV: 92.9 fl (ref 78.0–100.0)
Monocytes Absolute: 0.6 K/uL (ref 0.1–1.0)
Monocytes Relative: 10.4 % (ref 3.0–12.0)
Neutro Abs: 2.3 K/uL (ref 1.4–7.7)
Neutrophils Relative %: 42.6 % — ABNORMAL LOW (ref 43.0–77.0)
Platelets: 183 K/uL (ref 150.0–400.0)
RBC: 3.71 Mil/uL — ABNORMAL LOW (ref 3.87–5.11)
RDW: 14.1 % (ref 11.5–15.5)
WBC: 5.4 K/uL (ref 4.0–10.5)

## 2023-09-29 LAB — COMPREHENSIVE METABOLIC PANEL WITH GFR
ALT: 14 U/L (ref 0–35)
AST: 18 U/L (ref 0–37)
Albumin: 4.3 g/dL (ref 3.5–5.2)
Alkaline Phosphatase: 67 U/L (ref 39–117)
BUN: 23 mg/dL (ref 6–23)
CO2: 29 meq/L (ref 19–32)
Calcium: 9.5 mg/dL (ref 8.4–10.5)
Chloride: 104 meq/L (ref 96–112)
Creatinine, Ser: 1.14 mg/dL (ref 0.40–1.20)
GFR: 45.07 mL/min — ABNORMAL LOW (ref >60.00)
Glucose, Bld: 76 mg/dL (ref 70–99)
Potassium: 4.2 meq/L (ref 3.5–5.1)
Sodium: 139 meq/L (ref 135–145)
Total Bilirubin: 0.4 mg/dL (ref 0.2–1.2)
Total Protein: 6.8 g/dL (ref 6.0–8.3)

## 2023-09-29 LAB — TSH: TSH: 4 u[IU]/mL (ref 0.35–5.50)

## 2023-09-29 NOTE — Assessment & Plan Note (Signed)
 Chronic GERD controlled - occ gerd Continue omeprazole 40 mg daily

## 2023-09-29 NOTE — Assessment & Plan Note (Signed)
 Chronic Blood pressure well controlled cmp Continue nebivolol 2.5 mg daily, telmisartan 40 mg daily

## 2023-09-29 NOTE — Assessment & Plan Note (Signed)
Chronic ?Controlled, stable ?Mild, persistent ?Continue symbicort bid, xopenex as needed ? ?

## 2023-09-29 NOTE — Assessment & Plan Note (Signed)
 Chronic Mild, stable cmp

## 2023-09-29 NOTE — Assessment & Plan Note (Signed)
Chronic Pain overall controlled with current regimen She is taking medication appropriately and the medication is helping her function to a higher level [place] controlled substance database checked Continue Norco 10-325 mg 1-2 tabs twice daily as needed Follow-up in 3 months

## 2023-09-29 NOTE — Assessment & Plan Note (Addendum)
 Chronic  Clinically euthyroid Currently taking Synthroid  75 mcg daily 5 days a week Check TSH-will titrate medication dose as needed

## 2023-09-29 NOTE — Assessment & Plan Note (Signed)
 Chronic Somewhat controlled Continue oxybutynin XL 10 mg nightly

## 2023-09-29 NOTE — Assessment & Plan Note (Signed)
 Chronic Pain management for chronic back pain which she has had for years [place]  controlled substance database checked [place] ordered Pain contract up to date She is taking her pain medication appropriately and it is controlling her pain and making her functional No side effects from pain medication Continue Norco 10-325 mg 1-2 tabs twice daily as needed Follow up in 3 months

## 2023-09-29 NOTE — Assessment & Plan Note (Signed)
 Chronic Lab Results  Component Value Date   LDLCALC 95 06/22/2023   Continue lifestyle control Regular exercise and healthy diet encouraged

## 2023-09-29 NOTE — Assessment & Plan Note (Signed)
Chronic Controlled, Stable Continue Cymbalta 30 mg daily

## 2023-09-29 NOTE — Assessment & Plan Note (Signed)
 Chronic Lab Results  Component Value Date   HGBA1C 6.2 06/22/2023   Low sugar / carb diet Stressed regular exercise

## 2023-09-29 NOTE — Assessment & Plan Note (Addendum)
 Chronic Controlled, Stable We both feel she is not depressed and that is not the cause of her sleeping more Continue Cymbalta  30 mg daily

## 2023-09-30 ENCOUNTER — Ambulatory Visit: Payer: Self-pay | Admitting: Internal Medicine

## 2023-10-14 ENCOUNTER — Other Ambulatory Visit: Payer: Self-pay | Admitting: Internal Medicine

## 2023-10-14 DIAGNOSIS — M545 Low back pain, unspecified: Secondary | ICD-10-CM

## 2023-10-14 MED ORDER — HYDROCODONE-ACETAMINOPHEN 10-325 MG PO TABS
2.0000 | ORAL_TABLET | Freq: Two times a day (BID) | ORAL | 0 refills | Status: DC | PRN
Start: 2023-10-14 — End: 2023-11-15

## 2023-10-14 NOTE — Telephone Encounter (Unsigned)
 Copied from CRM [id number]. Topic: Clinical - Medication Refill >> Oct 14, 2023 10:59 AM Paige D wrote: Medication: HYDROcodone -acetaminophen  (NORCO) 10-325 MG tablet  Has the patient contacted their pharmacy? No (Agent: If no, request that the patient contact the pharmacy for the refill. If patient does not wish to contact the pharmacy document the reason why and proceed with request.) (Agent: If yes, when and what did the pharmacy advise?)  This is the patient's preferred pharmacy:  [facility] [place], [place] - [address] [address] Phone: [phone] Fax: [phone]  Is this the correct pharmacy for this prescription? Yes If no, delete pharmacy and type the correct one.   Has the prescription been filled recently? No  Is the patient out of the medication? No, pt has 1 left   Has the patient been seen for an appointment in the last year OR does the patient have an upcoming appointment? Yes  Can we respond through MyChart? Yes  Agent: Please be advised that Rx refills may take up to 3 business days. We ask that you follow-up with your pharmacy.

## 2023-10-30 ENCOUNTER — Other Ambulatory Visit: Payer: Self-pay | Admitting: Internal Medicine

## 2023-11-15 ENCOUNTER — Other Ambulatory Visit: Payer: Self-pay | Admitting: Internal Medicine

## 2023-11-15 DIAGNOSIS — M545 Low back pain, unspecified: Secondary | ICD-10-CM

## 2023-11-15 MED ORDER — HYDROCODONE-ACETAMINOPHEN 10-325 MG PO TABS: 2.0000 | ORAL_TABLET | Freq: Two times a day (BID) | ORAL | 0 refills | Status: DC | PRN

## 2023-11-15 NOTE — Telephone Encounter (Unsigned)
 Copied from CRM [id number]. Topic: Clinical - Medication Refill >> Nov 15, 2023  2:42 PM Frederich PARAS wrote: Medication: HYDROcodone -acetaminophen  (NORCO) 10-325 MG tablet  Has the patient contacted their pharmacy? Yes yes,  she said they wont do it without the dr.   This is the patient's preferred pharmacy:  [facility] [place], [place] - [address] [place] [address] Phone: [phone] Fax: [phone]   Is this the correct pharmacy for this prescription? Yes If no, delete pharmacy and type the correct one.   Has the prescription been filled recently? Yes  Is the patient out of the medication? No  Has the patient been seen for an appointment in the last year OR does the patient have an upcoming appointment? Yes  Can we respond through MyChart? No  Agent: Please be advised that Rx refills may take up to 3 business days. We ask that you follow-up with your pharmacy.

## 2023-11-19 ENCOUNTER — Other Ambulatory Visit: Payer: Self-pay | Admitting: Internal Medicine

## 2023-12-09 DIAGNOSIS — Z961 Presence of intraocular lens: Secondary | ICD-10-CM | POA: Diagnosis not present

## 2023-12-09 DIAGNOSIS — H35073 Retinal telangiectasis, bilateral: Secondary | ICD-10-CM | POA: Diagnosis not present

## 2023-12-21 ENCOUNTER — Other Ambulatory Visit: Payer: Self-pay | Admitting: Internal Medicine

## 2023-12-21 DIAGNOSIS — G8929 Other chronic pain: Secondary | ICD-10-CM

## 2023-12-21 NOTE — Telephone Encounter (Unsigned)
 Copied from CRM [id number]. Topic: Clinical - Medication Refill >> Dec 21, 2023  2:42 PM Tysheama G wrote: Medication: HYDROcodone -acetaminophen  (NORCO) 10-325 MG tablet   Has the patient contacted their pharmacy? Yes (Agent: If no, request that the patient contact the pharmacy for the refill. If patient does not wish to contact the pharmacy document the reason why and proceed with request.) (Agent: If yes, when and what did the pharmacy advise?)  This is the patient's preferred pharmacy:  [facility] [place], [place] - [address] [place] [address] Phone: [phone] Fax: [phone]    Is this the correct pharmacy for this prescription? Yes If no, delete pharmacy and type the correct one.   Has the prescription been filled recently? No  Is the patient out of the medication? Yes  Has the patient been seen for an appointment in the last year OR does the patient have an upcoming appointment? Yes  Can we respond through MyChart? Yes  Agent: Please be advised that Rx refills may take up to 3 business days. We ask that you follow-up with your pharmacy.

## 2023-12-24 MED ORDER — HYDROCODONE-ACETAMINOPHEN 10-325 MG PO TABS
2.0000 | ORAL_TABLET | Freq: Two times a day (BID) | ORAL | 0 refills | Status: DC | PRN
Start: 2023-12-24 — End: 2024-01-28

## 2023-12-24 NOTE — Telephone Encounter (Signed)
 Pt called back because she has not had any updates on rx request. Please advise with refill.

## 2023-12-30 NOTE — Progress Notes (Unsigned)
 Subjective:    Patient ID: Tonya [name], female    DOB: 1940/04/18, 83 y.o.   MRN: [id number]     HPI Tonya [name] is here for follow up of her chronic medical problems.  When leans over something comes in to her mouth - she has to stand up and she spits it out or swallows it. Yesterday it felt like it came from her sinuses.    Has no desire to get up in the morning - denies depression, but just wants to sleep.  Often goes to bed at 2-3 am, which likely explains it.   For a while had nocturia 2-3 times a night.  It has gotten better.    Has fallen twice in past several months.   Medications and allergies reviewed with patient and updated if appropriate.  Current Outpatient Medications on File Prior to Visit  Medication Sig Dispense Refill   budesonide -formoterol  (SYMBICORT ) 80-4.5 MCG/ACT inhaler TAKE 2 PUFFS BY MOUTH TWICE A DAY 30.6 each 4   Calcium Citrate 250 MG TABS Take 1 tablet by mouth daily.      cycloSPORINE (RESTASIS) 0.05 % ophthalmic emulsion Place 1 drop into both eyes 2 (two) times daily.     dimenhyDRINATE (DRAMAMINE) 50 MG tablet Take 50 mg by mouth every 8 (eight) hours as needed for nausea.     DULoxetine  (CYMBALTA ) 30 MG capsule TAKE 1 CAPSULE BY MOUTH EVERY DAY 90 capsule 2   Emollient (DERMEND FRAGILE SKIN) CREA as directed Externally     HYDROcodone -acetaminophen  (NORCO) 10-325 MG tablet Take 2 tablets by mouth 2 (two) times daily as needed (chronic lower back and neck pain). 120 tablet 0   levalbuterol  (XOPENEX  HFA) 45 MCG/ACT inhaler Inhale 1-2 puffs into the lungs every 4 (four) hours as needed for wheezing. 1 each 8   nebivolol  (BYSTOLIC ) 2.5 MG tablet TAKE 1 TABLET BY MOUTH EVERY DAY 90 tablet 1   omeprazole  (PRILOSEC) 40 MG capsule TAKE 1 CAPSULE (40 MG TOTAL) BY MOUTH DAILY. 90 capsule 3   oxybutynin  (DITROPAN -XL) 10 MG 24 hr tablet TAKE 1 TABLET BY MOUTH EVERYDAY AT BEDTIME 90 tablet 1   SYNTHROID  75 MCG tablet TAKE 1 TABLET BY MOUTH DAILY  BEFORE BREAKFAST. 90 tablet 1   telmisartan  (MICARDIS ) 40 MG tablet TAKE 1 TABLET BY MOUTH EVERY DAY 90 tablet 1   No current facility-administered medications on file prior to visit.     Review of Systems  Constitutional:  Negative for fever.  HENT:  Negative for trouble swallowing.   Respiratory:  Positive for shortness of breath (chronic - no change). Negative for cough and wheezing.   Cardiovascular:  Negative for chest pain, palpitations and leg swelling.  Gastrointestinal:        GERD controlled, occ reflux when bending over  Musculoskeletal:  Positive for back pain.  Neurological:  Positive for light-headedness (occ). Negative for headaches.       Objective:   Vitals:   12/31/23 1329  BP: 138/72  Pulse: 76  Temp: 98 F (36.7 C)  SpO2: 96%   BP Readings from Last 3 Encounters:  12/31/23 138/72  09/29/23 128/72  06/22/23 118/72   Wt Readings from Last 3 Encounters:  09/29/23 96 lb (43.5 kg)  06/22/23 95 lb 9.6 oz (43.4 kg)  06/07/23 96 lb (43.5 kg)   Body mass index is 21.52 kg/m.    Physical Exam Constitutional:      General: She is not in acute distress.  Appearance: Normal appearance.  HENT:     Head: Normocephalic and atraumatic.  Eyes:     Conjunctiva/sclera: Conjunctivae normal.  Cardiovascular:     Rate and Rhythm: Normal rate and regular rhythm.     Heart sounds: Normal heart sounds.  Pulmonary:     Effort: Pulmonary effort is normal. No respiratory distress.     Breath sounds: Normal breath sounds. No wheezing.  Musculoskeletal:     Cervical back: Neck supple.     Right lower leg: No edema.     Left lower leg: No edema.  Lymphadenopathy:     Cervical: No cervical adenopathy.  Skin:    General: Skin is warm and dry.     Findings: No rash.  Neurological:     Mental Status: She is alert. Mental status is at baseline.  Psychiatric:        Mood and Affect: Mood normal.        Behavior: Behavior normal.        Lab Results   Component Value Date   WBC 5.4 09/29/2023   HGB 11.4 (L) 09/29/2023   HCT 34.5 (L) 09/29/2023   PLT 183.0 09/29/2023   GLUCOSE 76 09/29/2023   CHOL 185 06/22/2023   TRIG 109.0 06/22/2023   HDL 68.80 06/22/2023   LDLCALC 95 06/22/2023   ALT 14 09/29/2023   AST 18 09/29/2023   NA 139 09/29/2023   K 4.2 09/29/2023   CL 104 09/29/2023   CREATININE 1.14 09/29/2023   BUN 23 09/29/2023   CO2 29 09/29/2023   TSH 4.00 09/29/2023   HGBA1C 6.2 06/22/2023   MICROALBUR 2.4 (H) 06/22/2023     Assessment & Plan:    See Problem List for Assessment and Plan of chronic medical problems.

## 2023-12-30 NOTE — Patient Instructions (Addendum)
      Flu immunization administered today.      Medications changes include :   None      Return in about 3 months (around 04/01/2024) for Physical Exam.

## 2023-12-31 ENCOUNTER — Ambulatory Visit (INDEPENDENT_AMBULATORY_CARE_PROVIDER_SITE_OTHER): Admitting: Internal Medicine

## 2023-12-31 ENCOUNTER — Encounter: Payer: Self-pay | Admitting: Internal Medicine

## 2023-12-31 VITALS — BP 138/72 | HR 76 | Temp 98.0°F | Ht <= 58 in | Wt 96.0 lb

## 2023-12-31 DIAGNOSIS — I10 Essential (primary) hypertension: Secondary | ICD-10-CM | POA: Diagnosis not present

## 2023-12-31 DIAGNOSIS — F419 Anxiety disorder, unspecified: Secondary | ICD-10-CM

## 2023-12-31 DIAGNOSIS — G8929 Other chronic pain: Secondary | ICD-10-CM

## 2023-12-31 DIAGNOSIS — Z23 Encounter for immunization: Secondary | ICD-10-CM

## 2023-12-31 DIAGNOSIS — M545 Low back pain, unspecified: Secondary | ICD-10-CM | POA: Diagnosis not present

## 2023-12-31 DIAGNOSIS — R7303 Prediabetes: Secondary | ICD-10-CM

## 2023-12-31 DIAGNOSIS — F3289 Other specified depressive episodes: Secondary | ICD-10-CM

## 2023-12-31 DIAGNOSIS — J4530 Mild persistent asthma, uncomplicated: Secondary | ICD-10-CM | POA: Diagnosis not present

## 2023-12-31 DIAGNOSIS — M8589 Other specified disorders of bone density and structure, multiple sites: Secondary | ICD-10-CM

## 2023-12-31 DIAGNOSIS — N1831 Chronic kidney disease, stage 3a: Secondary | ICD-10-CM

## 2023-12-31 DIAGNOSIS — N3281 Overactive bladder: Secondary | ICD-10-CM

## 2023-12-31 DIAGNOSIS — E039 Hypothyroidism, unspecified: Secondary | ICD-10-CM

## 2023-12-31 DIAGNOSIS — K219 Gastro-esophageal reflux disease without esophagitis: Secondary | ICD-10-CM

## 2023-12-31 NOTE — Assessment & Plan Note (Signed)
 Chronic Controlled, Stable We both feel she is not depressed and that is not the cause of her sleeping more Continue Cymbalta  30 mg daily

## 2023-12-31 NOTE — Assessment & Plan Note (Signed)
 Chronic Pain management for chronic back pain which she has had for years [place]  controlled substance database checked Pain contract up to date She is taking her pain medication appropriately and it is controlling her pain and making her functional No side effects from pain medication Continue Norco 10-325 mg 1-2 tabs twice daily as needed Follow up in 3 months

## 2023-12-31 NOTE — Assessment & Plan Note (Signed)
Chronic Controlled, Stable Continue Cymbalta 30 mg daily

## 2023-12-31 NOTE — Assessment & Plan Note (Signed)
 Chronic Somewhat controlled Continue oxybutynin XL 10 mg nightly

## 2023-12-31 NOTE — Assessment & Plan Note (Signed)
 Chronic Dexa up to date Encouraged regular exercise Continue calcium and vitamin d

## 2023-12-31 NOTE — Assessment & Plan Note (Signed)
Chronic Pain overall controlled with current regimen She is taking medication appropriately and the medication is helping her function to a higher level [place] controlled substance database checked Continue Norco 10-325 mg 1-2 tabs twice daily as needed Follow-up in 3 months

## 2023-12-31 NOTE — Assessment & Plan Note (Addendum)
 Chronic  Clinically euthyroid Currently taking Synthroid 75 mcg daily  Check TSH-will titrate medication dose as needed

## 2023-12-31 NOTE — Assessment & Plan Note (Signed)
 Chronic Lab Results  Component Value Date   HGBA1C 6.2 06/22/2023   Low sugar / carb diet Stressed regular exercise

## 2023-12-31 NOTE — Assessment & Plan Note (Signed)
 Chronic Mild, stable cmp

## 2023-12-31 NOTE — Assessment & Plan Note (Signed)
Chronic ?Controlled, stable ?Mild, persistent ?Continue symbicort bid, xopenex as needed ? ?

## 2023-12-31 NOTE — Assessment & Plan Note (Signed)
 Chronic Blood pressure well controlled cmp Continue nebivolol 2.5 mg daily, telmisartan 40 mg daily

## 2023-12-31 NOTE — Assessment & Plan Note (Addendum)
 Chronic GERD controlled - occ gerd Also having some reflux at times-often related to bending over which can occur. She has had some evidence of esophageal dysmotility as well which could be influencing some of her symptoms Monitor let me know if this persists Continue omeprazole  40 mg daily

## 2024-01-14 ENCOUNTER — Other Ambulatory Visit: Payer: Self-pay | Admitting: Internal Medicine

## 2024-01-28 ENCOUNTER — Other Ambulatory Visit: Payer: Self-pay | Admitting: Internal Medicine

## 2024-01-28 DIAGNOSIS — G8929 Other chronic pain: Secondary | ICD-10-CM

## 2024-01-28 MED ORDER — HYDROCODONE-ACETAMINOPHEN 10-325 MG PO TABS: 2.0000 | ORAL_TABLET | Freq: Two times a day (BID) | ORAL | 0 refills | Status: AC | PRN

## 2024-01-28 NOTE — Telephone Encounter (Unsigned)
 Copied from CRM [id number]. Topic: Clinical - Medication Refill >> Jan 28, 2024 11:33 AM Alfonso ORN wrote: Medication:  HYDROcodone -acetaminophen  (NORCO) 10-325 MG tablet    Has the patient contacted their pharmacy? No   This is the patient's preferred pharmacy:  [facility] [place], [place] - [address] [place] [address] Phone: [phone] Fax: [phone]    Is this the correct pharmacy for this prescription? Yes If no, delete pharmacy and type the correct one.   Has the prescription been filled recently? No, not since last month   Is the patient out of the medication? Yes  Has the patient been seen for an appointment in the last year OR does the patient have an upcoming appointment? Yes  Can we respond through MyChart? Yes

## 2024-02-01 DIAGNOSIS — L821 Other seborrheic keratosis: Secondary | ICD-10-CM | POA: Diagnosis not present

## 2024-02-01 DIAGNOSIS — L570 Actinic keratosis: Secondary | ICD-10-CM | POA: Diagnosis not present

## 2024-02-25 ENCOUNTER — Other Ambulatory Visit: Payer: Self-pay | Admitting: Internal Medicine

## 2024-02-25 DIAGNOSIS — G8929 Other chronic pain: Secondary | ICD-10-CM

## 2024-02-25 MED ORDER — HYDROCODONE-ACETAMINOPHEN 10-325 MG PO TABS: 2.0000 | ORAL_TABLET | Freq: Two times a day (BID) | ORAL | 0 refills | Status: DC | PRN

## 2024-02-25 NOTE — Telephone Encounter (Unsigned)
 Copied from CRM [id number]. Topic: Clinical - Medication Refill >> Feb 25, 2024  1:32 PM Alexandria E wrote: Medication: HYDROcodone -acetaminophen  (NORCO) 10-325 MG tablet  Has the patient contacted their pharmacy? Yes (Agent: If no, request that the patient contact the pharmacy for the refill. If patient does not wish to contact the pharmacy document the reason why and proceed with request.) (Agent: If yes, when and what did the pharmacy advise?)  This is the patient's preferred pharmacy:  [facility] [place], [place] - [address] [place] [address] Phone: [phone] Fax: [phone]    Is this the correct pharmacy for this prescription? Yes If no, delete pharmacy and type the correct one.   Has the prescription been filled recently? No  Is the patient out of the medication? No, couple of days left.  Has the patient been seen for an appointment in the last year OR does the patient have an upcoming appointment? Yes  Can we respond through MyChart? Yes  Agent: Please be advised that Rx refills may take up to 3 business days. We ask that you follow-up with your pharmacy.

## 2024-03-10 ENCOUNTER — Other Ambulatory Visit: Payer: Self-pay | Admitting: Internal Medicine

## 2024-03-19 ENCOUNTER — Other Ambulatory Visit: Payer: Self-pay | Admitting: Internal Medicine

## 2024-04-03 ENCOUNTER — Other Ambulatory Visit: Payer: Self-pay

## 2024-04-03 DIAGNOSIS — G8929 Other chronic pain: Secondary | ICD-10-CM

## 2024-04-03 NOTE — Telephone Encounter (Signed)
 Copied from CRM #[id number]. Topic: Clinical - Medication Refill >> Apr 03, 2024  4:05 PM Rea ORN wrote: Medication:  HYDROcodone -acetaminophen  (NORCO) 10-325 MG tablet    Has the patient contacted their pharmacy? Yes (Agent: If no, request that the patient contact the pharmacy for the refill. If patient does not wish to contact the pharmacy document the reason why and proceed with request.) (Agent: If yes, when and what did the pharmacy advise?)  This is the patient's preferred pharmacy:  [facility] [place], [place] - [address] [place] [address] Phone: [phone] Fax: [phone]   Is this the correct pharmacy for this prescription? Yes If no, delete pharmacy and type the correct one.   Has the prescription been filled recently? No  Is the patient out of the medication? Yes  Has the patient been seen for an appointment in the last year OR does the patient have an upcoming appointment? Yes  Can we respond through MyChart? No  Agent: Please be advised that Rx refills may take up to 3 business days. We ask that you follow-up with your pharmacy.

## 2024-04-04 MED ORDER — HYDROCODONE-ACETAMINOPHEN 10-325 MG PO TABS: 2.0000 | ORAL_TABLET | Freq: Two times a day (BID) | ORAL | 0 refills | Status: AC | PRN

## 2024-04-10 ENCOUNTER — Encounter: Payer: Self-pay | Admitting: Internal Medicine

## 2024-04-11 ENCOUNTER — Ambulatory Visit: Admitting: Internal Medicine

## 2024-04-11 DIAGNOSIS — M8589 Other specified disorders of bone density and structure, multiple sites: Secondary | ICD-10-CM

## 2024-04-11 DIAGNOSIS — G8929 Other chronic pain: Secondary | ICD-10-CM

## 2024-04-11 DIAGNOSIS — F419 Anxiety disorder, unspecified: Secondary | ICD-10-CM

## 2024-04-11 DIAGNOSIS — J4530 Mild persistent asthma, uncomplicated: Secondary | ICD-10-CM

## 2024-04-11 DIAGNOSIS — N3281 Overactive bladder: Secondary | ICD-10-CM

## 2024-04-11 DIAGNOSIS — E039 Hypothyroidism, unspecified: Secondary | ICD-10-CM

## 2024-04-11 DIAGNOSIS — N1831 Chronic kidney disease, stage 3a: Secondary | ICD-10-CM

## 2024-04-11 DIAGNOSIS — Z0000 Encounter for general adult medical examination without abnormal findings: Secondary | ICD-10-CM

## 2024-04-11 DIAGNOSIS — R7303 Prediabetes: Secondary | ICD-10-CM

## 2024-04-11 DIAGNOSIS — I10 Essential (primary) hypertension: Secondary | ICD-10-CM

## 2024-04-11 DIAGNOSIS — E7800 Pure hypercholesterolemia, unspecified: Secondary | ICD-10-CM

## 2024-04-11 DIAGNOSIS — F3289 Other specified depressive episodes: Secondary | ICD-10-CM

## 2024-06-09 ENCOUNTER — Encounter
